# Patient Record
Sex: Female | Born: 1945
Health system: Southern US, Community
[De-identification: ages and names within clinical notes are randomized; demographics above are authoritative.]

## PROBLEM LIST (undated history)

## (undated) DIAGNOSIS — M810 Age-related osteoporosis without current pathological fracture: Secondary | ICD-10-CM

## (undated) DIAGNOSIS — K225 Diverticulum of esophagus, acquired: Secondary | ICD-10-CM

## (undated) DIAGNOSIS — F419 Anxiety disorder, unspecified: Secondary | ICD-10-CM

## (undated) DIAGNOSIS — C801 Malignant (primary) neoplasm, unspecified: Secondary | ICD-10-CM

## (undated) HISTORY — DX: Anxiety disorder, unspecified: F41.9

## (undated) HISTORY — DX: Age-related osteoporosis without current pathological fracture: M81.0

## (undated) HISTORY — DX: Malignant (primary) neoplasm, unspecified: C80.1

## (undated) HISTORY — PX: ZENKER'S DIVERTICULECTOMY: SHX5223

---

## 1999-04-24 ENCOUNTER — Emergency Department (HOSPITAL_COMMUNITY): Admission: EM | Admit: 1999-04-24 | Discharge: 1999-04-25 | Payer: Self-pay | Admitting: *Deleted

## 1999-07-18 ENCOUNTER — Encounter: Admission: RE | Admit: 1999-07-18 | Discharge: 1999-07-18 | Payer: Self-pay | Admitting: Family Medicine

## 1999-07-18 ENCOUNTER — Encounter: Payer: Self-pay | Admitting: Family Medicine

## 1999-07-27 ENCOUNTER — Encounter: Payer: Self-pay | Admitting: Family Medicine

## 1999-07-27 ENCOUNTER — Encounter: Admission: RE | Admit: 1999-07-27 | Discharge: 1999-07-27 | Payer: Self-pay | Admitting: Family Medicine

## 2000-04-30 ENCOUNTER — Encounter: Payer: Self-pay | Admitting: Family Medicine

## 2000-04-30 ENCOUNTER — Encounter: Admission: RE | Admit: 2000-04-30 | Discharge: 2000-04-30 | Payer: Self-pay | Admitting: Family Medicine

## 2000-09-02 ENCOUNTER — Encounter: Admission: RE | Admit: 2000-09-02 | Discharge: 2000-09-02 | Payer: Self-pay | Admitting: Family Medicine

## 2000-09-02 ENCOUNTER — Encounter: Payer: Self-pay | Admitting: Family Medicine

## 2001-05-02 ENCOUNTER — Encounter: Admission: RE | Admit: 2001-05-02 | Discharge: 2001-05-02 | Payer: Self-pay | Admitting: Family Medicine

## 2001-05-02 ENCOUNTER — Encounter: Payer: Self-pay | Admitting: Family Medicine

## 2002-07-07 ENCOUNTER — Encounter: Admission: RE | Admit: 2002-07-07 | Discharge: 2002-07-07 | Payer: Self-pay | Admitting: Family Medicine

## 2002-07-07 ENCOUNTER — Encounter: Payer: Self-pay | Admitting: Family Medicine

## 2002-09-08 ENCOUNTER — Encounter: Payer: Self-pay | Admitting: Family Medicine

## 2002-09-08 ENCOUNTER — Encounter: Admission: RE | Admit: 2002-09-08 | Discharge: 2002-09-08 | Payer: Self-pay | Admitting: Family Medicine

## 2003-02-24 ENCOUNTER — Ambulatory Visit (HOSPITAL_COMMUNITY): Admission: RE | Admit: 2003-02-24 | Discharge: 2003-02-24 | Payer: Self-pay | Admitting: Orthopedic Surgery

## 2003-02-24 ENCOUNTER — Encounter: Payer: Self-pay | Admitting: Orthopedic Surgery

## 2003-07-13 ENCOUNTER — Encounter: Admission: RE | Admit: 2003-07-13 | Discharge: 2003-07-13 | Payer: Self-pay | Admitting: Family Medicine

## 2003-09-08 ENCOUNTER — Other Ambulatory Visit: Admission: RE | Admit: 2003-09-08 | Discharge: 2003-09-08 | Payer: Self-pay | Admitting: *Deleted

## 2004-08-21 ENCOUNTER — Ambulatory Visit (HOSPITAL_COMMUNITY): Admission: RE | Admit: 2004-08-21 | Discharge: 2004-08-21 | Payer: Self-pay | Admitting: Emergency Medicine

## 2004-09-13 ENCOUNTER — Other Ambulatory Visit: Admission: RE | Admit: 2004-09-13 | Discharge: 2004-09-13 | Payer: Self-pay | Admitting: *Deleted

## 2004-09-19 ENCOUNTER — Encounter: Admission: RE | Admit: 2004-09-19 | Discharge: 2004-09-19 | Payer: Self-pay | Admitting: Family Medicine

## 2005-08-22 ENCOUNTER — Ambulatory Visit (HOSPITAL_COMMUNITY): Admission: RE | Admit: 2005-08-22 | Discharge: 2005-08-22 | Payer: Self-pay | Admitting: Family Medicine

## 2005-09-28 ENCOUNTER — Other Ambulatory Visit: Admission: RE | Admit: 2005-09-28 | Discharge: 2005-09-28 | Payer: Self-pay | Admitting: *Deleted

## 2006-08-30 ENCOUNTER — Ambulatory Visit (HOSPITAL_COMMUNITY): Admission: RE | Admit: 2006-08-30 | Discharge: 2006-08-30 | Payer: Self-pay | Admitting: Emergency Medicine

## 2006-09-30 ENCOUNTER — Other Ambulatory Visit: Admission: RE | Admit: 2006-09-30 | Discharge: 2006-09-30 | Payer: Self-pay | Admitting: Family Medicine

## 2007-09-03 ENCOUNTER — Ambulatory Visit (HOSPITAL_COMMUNITY): Admission: RE | Admit: 2007-09-03 | Discharge: 2007-09-03 | Payer: Self-pay | Admitting: Family Medicine

## 2007-10-09 ENCOUNTER — Other Ambulatory Visit: Admission: RE | Admit: 2007-10-09 | Discharge: 2007-10-09 | Payer: Self-pay | Admitting: Family Medicine

## 2007-11-11 ENCOUNTER — Encounter: Admission: RE | Admit: 2007-11-11 | Discharge: 2007-11-11 | Payer: Self-pay | Admitting: Gastroenterology

## 2008-09-17 ENCOUNTER — Ambulatory Visit (HOSPITAL_COMMUNITY): Admission: RE | Admit: 2008-09-17 | Discharge: 2008-09-17 | Payer: Self-pay | Admitting: Family Medicine

## 2008-10-22 ENCOUNTER — Other Ambulatory Visit: Admission: RE | Admit: 2008-10-22 | Discharge: 2008-10-22 | Payer: Self-pay | Admitting: Family Medicine

## 2009-09-19 ENCOUNTER — Ambulatory Visit (HOSPITAL_COMMUNITY): Admission: RE | Admit: 2009-09-19 | Discharge: 2009-09-19 | Payer: Self-pay | Admitting: Family Medicine

## 2010-08-19 ENCOUNTER — Encounter: Payer: Self-pay | Admitting: Family Medicine

## 2010-08-20 ENCOUNTER — Encounter: Payer: Self-pay | Admitting: Family Medicine

## 2010-09-19 ENCOUNTER — Other Ambulatory Visit (HOSPITAL_COMMUNITY): Payer: Self-pay | Admitting: Family Medicine

## 2010-09-19 DIAGNOSIS — Z1231 Encounter for screening mammogram for malignant neoplasm of breast: Secondary | ICD-10-CM

## 2010-09-25 ENCOUNTER — Ambulatory Visit (HOSPITAL_COMMUNITY)
Admission: RE | Admit: 2010-09-25 | Discharge: 2010-09-25 | Disposition: A | Discharge status: Home / Self Care | Payer: Medicare Other | Source: Ambulatory Visit | Attending: Family Medicine | Admitting: Family Medicine

## 2010-09-25 DIAGNOSIS — Z1231 Encounter for screening mammogram for malignant neoplasm of breast: Secondary | ICD-10-CM | POA: Insufficient documentation

## 2011-08-30 ENCOUNTER — Other Ambulatory Visit (HOSPITAL_COMMUNITY): Payer: Self-pay | Admitting: Family Medicine

## 2011-08-30 DIAGNOSIS — Z1231 Encounter for screening mammogram for malignant neoplasm of breast: Secondary | ICD-10-CM

## 2011-09-27 ENCOUNTER — Ambulatory Visit (HOSPITAL_COMMUNITY)
Admission: RE | Admit: 2011-09-27 | Discharge: 2011-09-27 | Disposition: A | Discharge status: Home / Self Care | Payer: Medicare Other | Source: Ambulatory Visit | Attending: Family Medicine | Admitting: Family Medicine

## 2011-09-27 DIAGNOSIS — Z1231 Encounter for screening mammogram for malignant neoplasm of breast: Secondary | ICD-10-CM

## 2011-10-18 ENCOUNTER — Other Ambulatory Visit: Payer: Self-pay | Admitting: Family Medicine

## 2011-10-18 ENCOUNTER — Other Ambulatory Visit (HOSPITAL_COMMUNITY)
Admission: RE | Admit: 2011-10-18 | Discharge: 2011-10-18 | Disposition: A | Discharge status: Home / Self Care | Payer: Medicare Other | Source: Ambulatory Visit | Attending: Family Medicine | Admitting: Family Medicine

## 2011-10-18 DIAGNOSIS — Z124 Encounter for screening for malignant neoplasm of cervix: Secondary | ICD-10-CM | POA: Insufficient documentation

## 2011-10-18 DIAGNOSIS — Z1159 Encounter for screening for other viral diseases: Secondary | ICD-10-CM | POA: Insufficient documentation

## 2012-05-23 ENCOUNTER — Other Ambulatory Visit: Payer: Self-pay

## 2012-11-17 ENCOUNTER — Other Ambulatory Visit (HOSPITAL_COMMUNITY): Payer: Self-pay | Admitting: Family Medicine

## 2012-11-17 DIAGNOSIS — Z1231 Encounter for screening mammogram for malignant neoplasm of breast: Secondary | ICD-10-CM

## 2012-11-19 ENCOUNTER — Ambulatory Visit (HOSPITAL_COMMUNITY)
Admission: RE | Admit: 2012-11-19 | Discharge: 2012-11-19 | Disposition: A | Discharge status: Home / Self Care | Payer: Medicare Other | Source: Ambulatory Visit | Attending: Family Medicine | Admitting: Family Medicine

## 2012-11-19 DIAGNOSIS — Z1231 Encounter for screening mammogram for malignant neoplasm of breast: Secondary | ICD-10-CM | POA: Insufficient documentation

## 2013-11-09 ENCOUNTER — Other Ambulatory Visit (HOSPITAL_COMMUNITY): Payer: Self-pay | Admitting: Family Medicine

## 2013-11-09 DIAGNOSIS — Z1231 Encounter for screening mammogram for malignant neoplasm of breast: Secondary | ICD-10-CM

## 2013-12-01 ENCOUNTER — Ambulatory Visit (HOSPITAL_COMMUNITY)
Admission: RE | Admit: 2013-12-01 | Discharge: 2013-12-01 | Disposition: A | Discharge status: Home / Self Care | Payer: Medicare Other | Source: Ambulatory Visit | Attending: Family Medicine | Admitting: Family Medicine

## 2013-12-01 DIAGNOSIS — Z1231 Encounter for screening mammogram for malignant neoplasm of breast: Secondary | ICD-10-CM | POA: Insufficient documentation

## 2013-12-15 ENCOUNTER — Ambulatory Visit: Payer: Medicare Other | Attending: Family Medicine | Admitting: Physical Therapy

## 2013-12-15 DIAGNOSIS — M545 Low back pain, unspecified: Secondary | ICD-10-CM | POA: Diagnosis not present

## 2013-12-15 DIAGNOSIS — S336XXA Sprain of sacroiliac joint, initial encounter: Secondary | ICD-10-CM | POA: Diagnosis not present

## 2013-12-15 DIAGNOSIS — X58XXXA Exposure to other specified factors, initial encounter: Secondary | ICD-10-CM | POA: Insufficient documentation

## 2013-12-15 DIAGNOSIS — M25559 Pain in unspecified hip: Secondary | ICD-10-CM | POA: Insufficient documentation

## 2013-12-15 DIAGNOSIS — IMO0001 Reserved for inherently not codable concepts without codable children: Secondary | ICD-10-CM | POA: Diagnosis not present

## 2013-12-23 ENCOUNTER — Ambulatory Visit: Payer: Medicare Other | Admitting: Physical Therapy

## 2013-12-23 DIAGNOSIS — IMO0001 Reserved for inherently not codable concepts without codable children: Secondary | ICD-10-CM | POA: Diagnosis not present

## 2013-12-30 ENCOUNTER — Ambulatory Visit: Payer: Medicare Other | Attending: Family Medicine | Admitting: Physical Therapy

## 2013-12-30 DIAGNOSIS — IMO0001 Reserved for inherently not codable concepts without codable children: Secondary | ICD-10-CM | POA: Insufficient documentation

## 2014-01-06 ENCOUNTER — Ambulatory Visit: Payer: Medicare Other | Admitting: Physical Therapy

## 2014-01-06 DIAGNOSIS — IMO0001 Reserved for inherently not codable concepts without codable children: Secondary | ICD-10-CM | POA: Diagnosis not present

## 2014-01-12 ENCOUNTER — Ambulatory Visit: Payer: Medicare Other | Admitting: Physical Therapy

## 2014-01-12 DIAGNOSIS — IMO0001 Reserved for inherently not codable concepts without codable children: Secondary | ICD-10-CM | POA: Diagnosis not present

## 2014-01-19 ENCOUNTER — Ambulatory Visit: Payer: Medicare Other | Admitting: Physical Therapy

## 2014-01-19 DIAGNOSIS — IMO0001 Reserved for inherently not codable concepts without codable children: Secondary | ICD-10-CM | POA: Diagnosis not present

## 2014-01-26 ENCOUNTER — Ambulatory Visit: Payer: Medicare Other | Admitting: Physical Therapy

## 2014-01-26 DIAGNOSIS — IMO0001 Reserved for inherently not codable concepts without codable children: Secondary | ICD-10-CM | POA: Diagnosis not present

## 2014-02-09 ENCOUNTER — Ambulatory Visit: Payer: Medicare Other | Attending: Family Medicine | Admitting: Physical Therapy

## 2014-02-09 DIAGNOSIS — IMO0001 Reserved for inherently not codable concepts without codable children: Secondary | ICD-10-CM | POA: Diagnosis not present

## 2014-07-15 ENCOUNTER — Other Ambulatory Visit: Payer: Self-pay | Admitting: Family Medicine

## 2014-07-15 DIAGNOSIS — R1900 Intra-abdominal and pelvic swelling, mass and lump, unspecified site: Secondary | ICD-10-CM

## 2014-07-19 ENCOUNTER — Ambulatory Visit
Admission: RE | Admit: 2014-07-19 | Discharge: 2014-07-19 | Disposition: A | Discharge status: Home / Self Care | Payer: Medicare Other | Source: Ambulatory Visit | Attending: Family Medicine | Admitting: Family Medicine

## 2014-07-19 DIAGNOSIS — R1900 Intra-abdominal and pelvic swelling, mass and lump, unspecified site: Secondary | ICD-10-CM

## 2014-11-11 ENCOUNTER — Other Ambulatory Visit: Payer: Self-pay | Admitting: Family Medicine

## 2014-11-11 DIAGNOSIS — R1030 Lower abdominal pain, unspecified: Secondary | ICD-10-CM

## 2014-11-17 ENCOUNTER — Ambulatory Visit
Admission: RE | Admit: 2014-11-17 | Discharge: 2014-11-17 | Disposition: A | Discharge status: Home / Self Care | Payer: Medicare Other | Source: Ambulatory Visit | Attending: Family Medicine | Admitting: Family Medicine

## 2014-11-17 DIAGNOSIS — R1030 Lower abdominal pain, unspecified: Secondary | ICD-10-CM

## 2014-11-17 MED ORDER — IOPAMIDOL (ISOVUE-300) INJECTION 61%
100.0000 mL | Freq: Once | INTRAVENOUS | Status: AC | PRN
  Administered 2014-11-17: 100 mL via INTRAVENOUS

## 2014-12-24 ENCOUNTER — Other Ambulatory Visit (HOSPITAL_COMMUNITY): Payer: Self-pay | Admitting: Family Medicine

## 2014-12-24 DIAGNOSIS — Z1231 Encounter for screening mammogram for malignant neoplasm of breast: Secondary | ICD-10-CM

## 2014-12-29 ENCOUNTER — Ambulatory Visit (HOSPITAL_COMMUNITY)
Admission: RE | Admit: 2014-12-29 | Discharge: 2014-12-29 | Disposition: A | Discharge status: Home / Self Care | Payer: Medicare Other | Source: Ambulatory Visit | Attending: Family Medicine | Admitting: Family Medicine

## 2014-12-29 DIAGNOSIS — Z1231 Encounter for screening mammogram for malignant neoplasm of breast: Secondary | ICD-10-CM | POA: Insufficient documentation

## 2015-02-22 ENCOUNTER — Other Ambulatory Visit: Payer: Self-pay | Admitting: Gastroenterology

## 2015-07-12 IMAGING — CT CT ABD-PELV W/ CM
3 of 5 series · 12 of 36 positions shown, 18 images · IV contrast (READICAT/WATER & [ID] ISOVUE 300)
Comparison: None

CLINICAL DATA: Lower abdominal pain and bloating for 1 month

EXAM:
CT ABDOMEN AND PELVIS WITH CONTRAST
TECHNIQUE: Multidetector CT imaging of the abdomen and pelvis was performed
using the standard protocol following bolus administration of
intravenous contrast.
CONTRAST:  100 cc Bsovue-TLL

[Series 3: abd/pelvis with · axial · 0.67mm/px · z∈[-333,-58]mm · 7 of 72 slices shown, 12 images]
[im 9/72  soft-tissue]
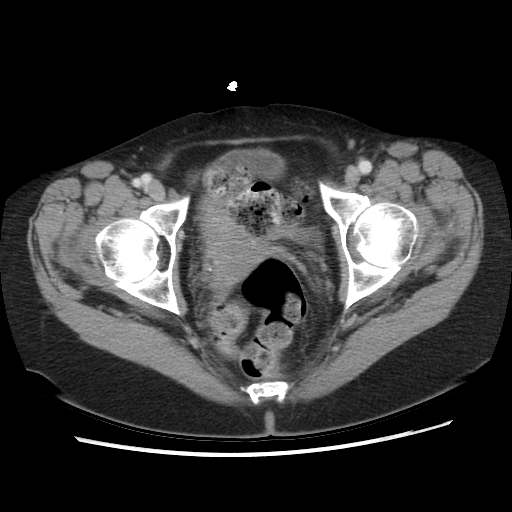
[im 9/72  bone]
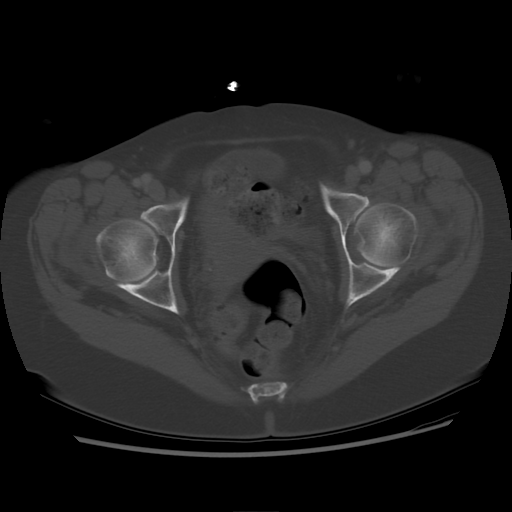
[im 18/72  soft-tissue]
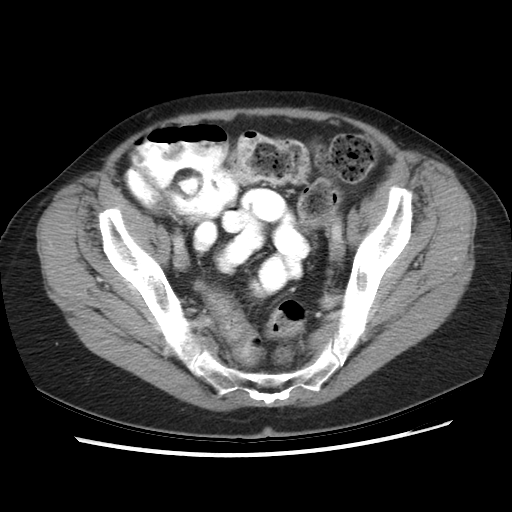
[im 27/72  soft-tissue]
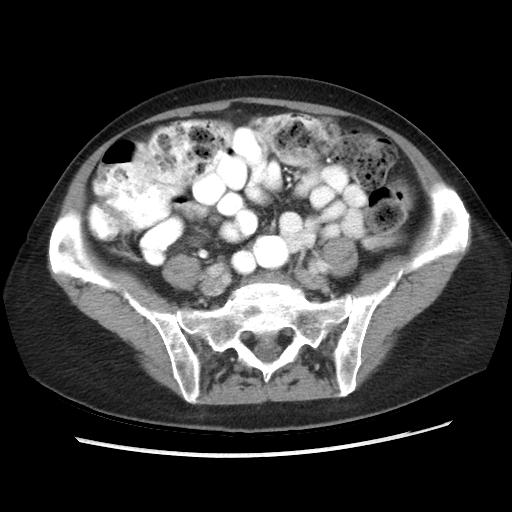
[im 36/72  soft-tissue]
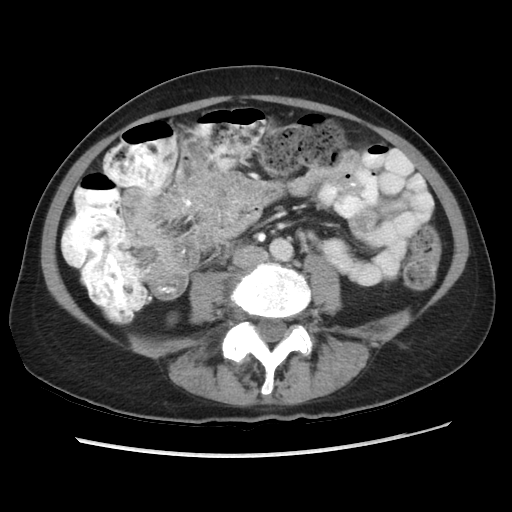
[im 36/72  lung]
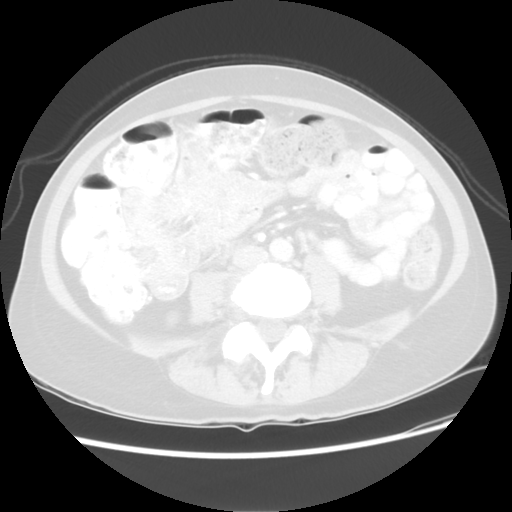
[im 45/72  soft-tissue]
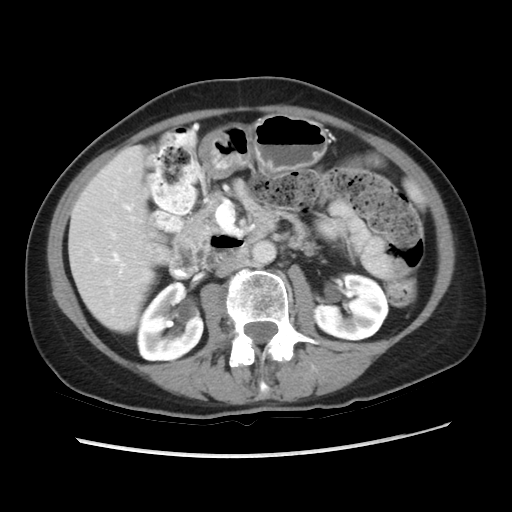
[im 45/72  lung]
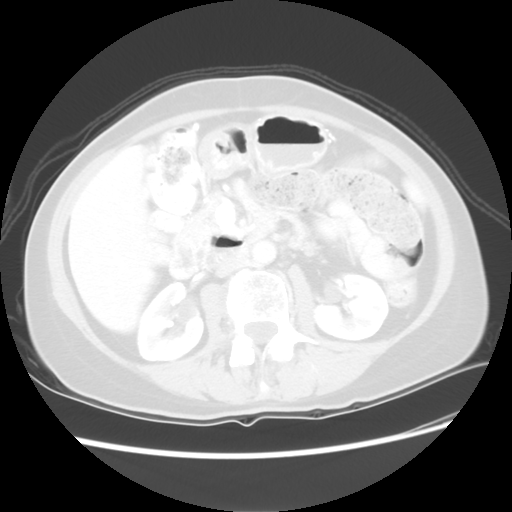
[im 54/72  soft-tissue]
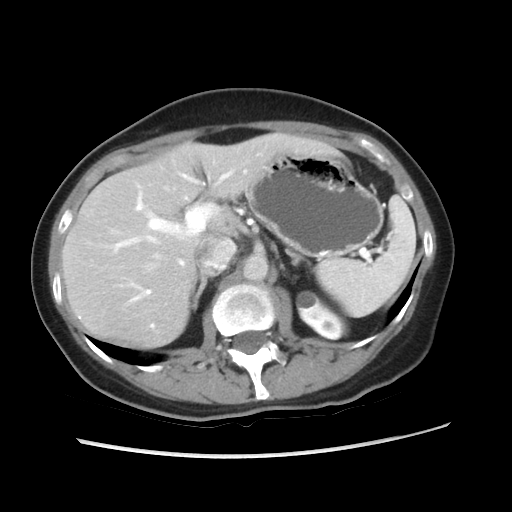
[im 54/72  lung]
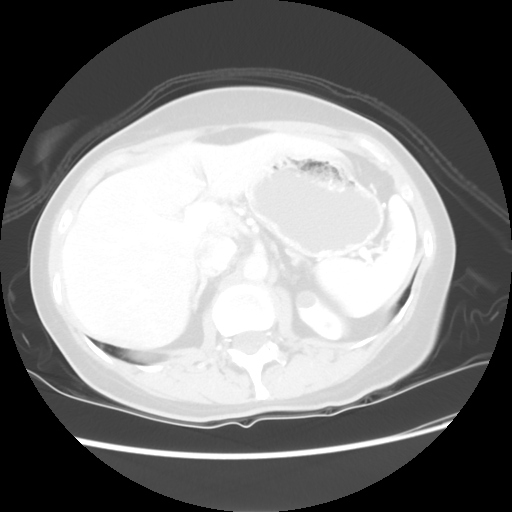
[im 63/72  soft-tissue]
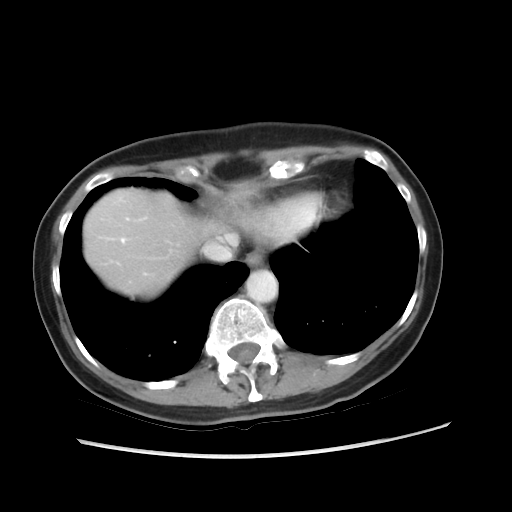
[im 63/72  lung]
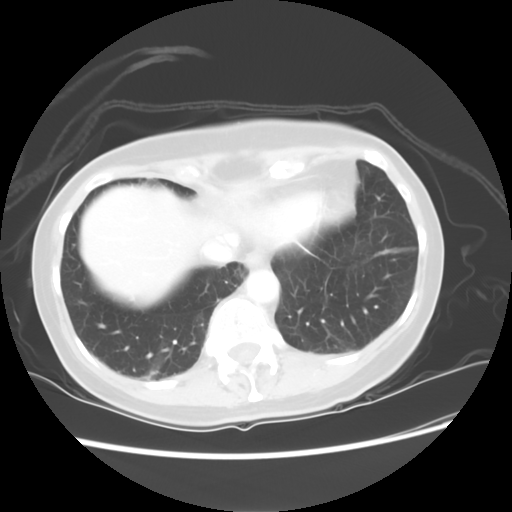

[Series 601: coronal body · coronal · 0.80mm/px · 1 of 106 slices shown, 2 images]
[im 36/106  soft-tissue]
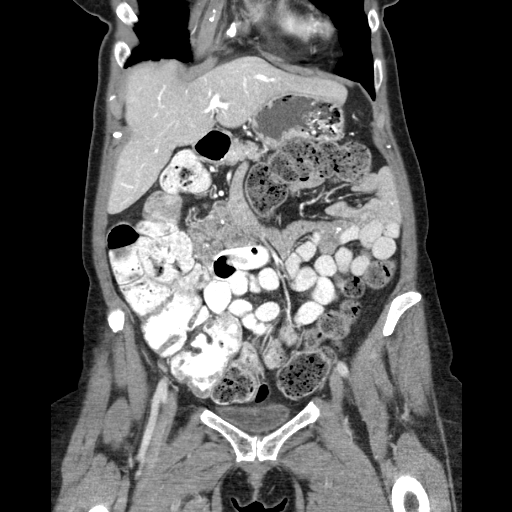
[im 36/106  bone]
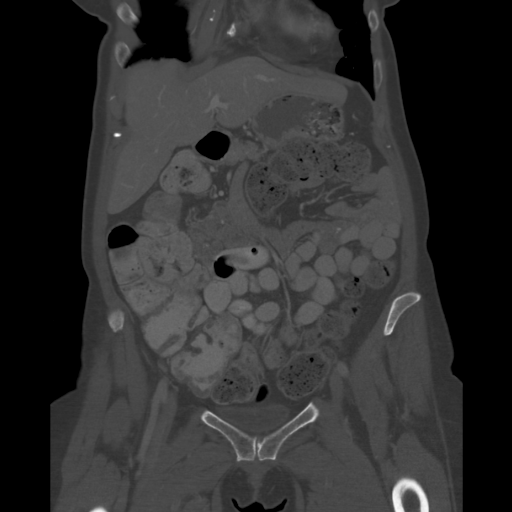

[Series 602: sagittal body · sagittal · 0.80mm/px · 4 of 139 slices shown]
[im 9/139  soft-tissue]
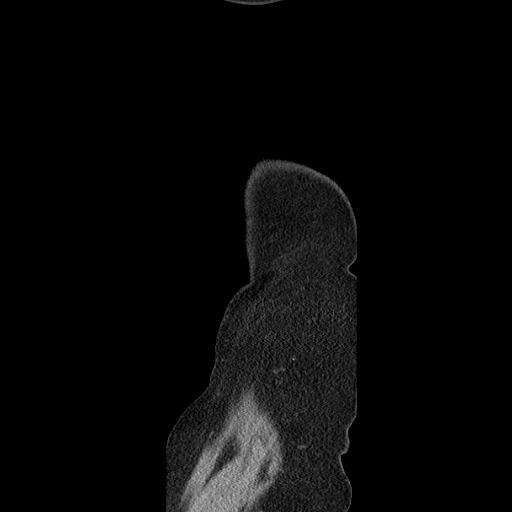
[im 26/139  soft-tissue]
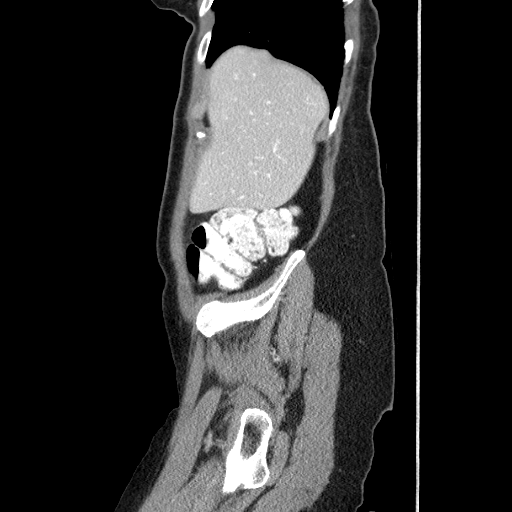
[im 44/139  soft-tissue]
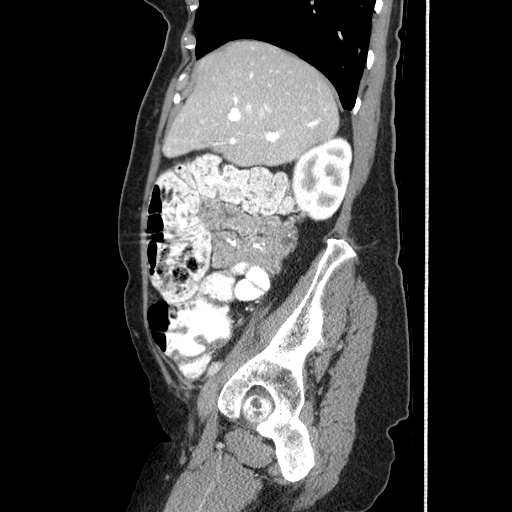
[im 61/139  soft-tissue]
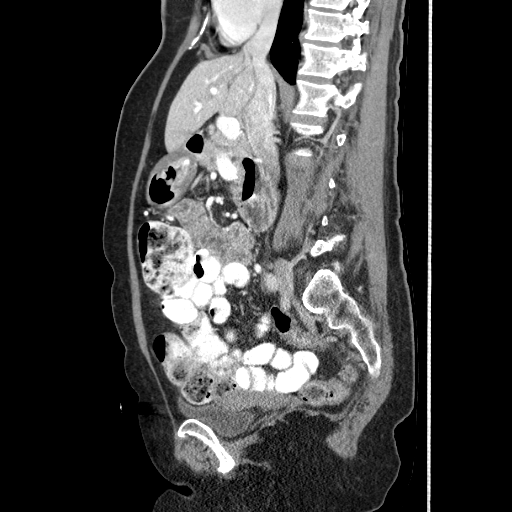

[12 of 36 positions shown; findings below may reference images not displayed]

FINDINGS: The lung bases are unremarkable. Sagittal images of the spine shows
disc space flattening with mild anterior spurring and endplate
sclerotic changes at L4-L5 and L5-S1 level.

Mild hepatic fatty infiltration. The patient is status
postcholecystectomy. No aortic aneurysm. No focal hepatic mass.
Enhanced pancreas, spleen and adrenal glands are unremarkable.
Enhanced kidneys are symmetrical in size. No hydronephrosis or
hydroureter. There is a cyst in upper pole of the left kidney
measures 1.3 cm. Delayed renal images shows bilateral renal
symmetrical excretion. Bilateral visualized proximal ureter is
unremarkable. There is a diverticulum in distal duodenum containing
air measures 2.4 cm.

Moderate stool and contrast noted in right colon. There is a low
lying cecum. No pericecal inflammation. The terminal ileum is
unremarkable. Abundant stool noted in transverse colon. There is
redundant transverse colon. Abundant stool noted in descending colon
and proximal sigmoid colon. There is some stool in distal sigmoid
colon and rectum.

No small bowel obstruction.  No ascites or free air.  No adenopathy.

The uterus is atrophic. No adnexal mass. The appendix is not
identified.
IMPRESSION: 1. Mild hepatic fatty infiltration.
2. Status postcholecystectomy.
3. There is a diverticulum in distal duodenum measures 2.4 cm.
4. Abundant colonic stool as described above. There is redundant
transverse colon with abundant stool. No colonic obstruction. No
small bowel obstruction.
5. No hydronephrosis or hydroureter. Bilateral renal symmetrical
excretion.
6. No adnexal mass.
7. There is a low lying cecum. No pericecal inflammation. The
appendix is not identified.
8. Degenerative changes lumbar spine at L4-L5 and L5-S1 level.

## 2015-08-24 DIAGNOSIS — H40013 Open angle with borderline findings, low risk, bilateral: Secondary | ICD-10-CM | POA: Diagnosis not present

## 2015-08-24 DIAGNOSIS — H25013 Cortical age-related cataract, bilateral: Secondary | ICD-10-CM | POA: Diagnosis not present

## 2015-08-24 DIAGNOSIS — H35031 Hypertensive retinopathy, right eye: Secondary | ICD-10-CM | POA: Diagnosis not present

## 2015-08-24 DIAGNOSIS — H35032 Hypertensive retinopathy, left eye: Secondary | ICD-10-CM | POA: Diagnosis not present

## 2015-08-24 DIAGNOSIS — H43811 Vitreous degeneration, right eye: Secondary | ICD-10-CM | POA: Diagnosis not present

## 2015-08-24 DIAGNOSIS — H2513 Age-related nuclear cataract, bilateral: Secondary | ICD-10-CM | POA: Diagnosis not present

## 2015-09-27 DIAGNOSIS — R69 Illness, unspecified: Secondary | ICD-10-CM | POA: Diagnosis not present

## 2015-11-17 DIAGNOSIS — Z131 Encounter for screening for diabetes mellitus: Secondary | ICD-10-CM | POA: Diagnosis not present

## 2015-11-17 DIAGNOSIS — Z0000 Encounter for general adult medical examination without abnormal findings: Secondary | ICD-10-CM | POA: Diagnosis not present

## 2015-11-17 DIAGNOSIS — R69 Illness, unspecified: Secondary | ICD-10-CM | POA: Diagnosis not present

## 2015-11-17 DIAGNOSIS — K5901 Slow transit constipation: Secondary | ICD-10-CM | POA: Diagnosis not present

## 2015-11-17 DIAGNOSIS — M2550 Pain in unspecified joint: Secondary | ICD-10-CM | POA: Diagnosis not present

## 2015-11-17 DIAGNOSIS — M899 Disorder of bone, unspecified: Secondary | ICD-10-CM | POA: Diagnosis not present

## 2015-11-17 DIAGNOSIS — Z8041 Family history of malignant neoplasm of ovary: Secondary | ICD-10-CM | POA: Diagnosis not present

## 2015-11-17 DIAGNOSIS — Z86010 Personal history of colonic polyps: Secondary | ICD-10-CM | POA: Diagnosis not present

## 2015-11-22 ENCOUNTER — Other Ambulatory Visit: Payer: Self-pay

## 2015-11-22 DIAGNOSIS — Z1231 Encounter for screening mammogram for malignant neoplasm of breast: Secondary | ICD-10-CM

## 2015-12-13 DIAGNOSIS — M859 Disorder of bone density and structure, unspecified: Secondary | ICD-10-CM | POA: Diagnosis not present

## 2015-12-13 DIAGNOSIS — M8589 Other specified disorders of bone density and structure, multiple sites: Secondary | ICD-10-CM | POA: Diagnosis not present

## 2015-12-30 ENCOUNTER — Ambulatory Visit
Admission: RE | Admit: 2015-12-30 | Discharge: 2015-12-30 | Disposition: A | Discharge status: Home / Self Care | Payer: Medicare HMO | Source: Ambulatory Visit

## 2015-12-30 DIAGNOSIS — Z1231 Encounter for screening mammogram for malignant neoplasm of breast: Secondary | ICD-10-CM | POA: Diagnosis not present

## 2016-01-11 DIAGNOSIS — R21 Rash and other nonspecific skin eruption: Secondary | ICD-10-CM | POA: Diagnosis not present

## 2016-01-11 DIAGNOSIS — L918 Other hypertrophic disorders of the skin: Secondary | ICD-10-CM | POA: Diagnosis not present

## 2016-01-11 DIAGNOSIS — D1801 Hemangioma of skin and subcutaneous tissue: Secondary | ICD-10-CM | POA: Diagnosis not present

## 2016-01-11 DIAGNOSIS — B351 Tinea unguium: Secondary | ICD-10-CM | POA: Diagnosis not present

## 2016-01-11 DIAGNOSIS — Z85828 Personal history of other malignant neoplasm of skin: Secondary | ICD-10-CM | POA: Diagnosis not present

## 2016-01-11 DIAGNOSIS — D2371 Other benign neoplasm of skin of right lower limb, including hip: Secondary | ICD-10-CM | POA: Diagnosis not present

## 2016-01-11 DIAGNOSIS — L814 Other melanin hyperpigmentation: Secondary | ICD-10-CM | POA: Diagnosis not present

## 2016-01-11 DIAGNOSIS — D2372 Other benign neoplasm of skin of left lower limb, including hip: Secondary | ICD-10-CM | POA: Diagnosis not present

## 2016-05-20 DIAGNOSIS — R69 Illness, unspecified: Secondary | ICD-10-CM | POA: Diagnosis not present

## 2016-07-17 DIAGNOSIS — R042 Hemoptysis: Secondary | ICD-10-CM | POA: Diagnosis not present

## 2016-07-17 DIAGNOSIS — R5383 Other fatigue: Secondary | ICD-10-CM | POA: Diagnosis not present

## 2016-08-18 DIAGNOSIS — B349 Viral infection, unspecified: Secondary | ICD-10-CM | POA: Diagnosis not present

## 2016-08-18 DIAGNOSIS — R05 Cough: Secondary | ICD-10-CM | POA: Diagnosis not present

## 2016-08-28 DIAGNOSIS — Z111 Encounter for screening for respiratory tuberculosis: Secondary | ICD-10-CM | POA: Diagnosis not present

## 2016-08-28 DIAGNOSIS — J209 Acute bronchitis, unspecified: Secondary | ICD-10-CM | POA: Diagnosis not present

## 2016-08-28 DIAGNOSIS — R042 Hemoptysis: Secondary | ICD-10-CM | POA: Diagnosis not present

## 2016-09-12 ENCOUNTER — Encounter: Payer: Self-pay | Admitting: Internal Medicine

## 2016-09-12 ENCOUNTER — Ambulatory Visit (INDEPENDENT_AMBULATORY_CARE_PROVIDER_SITE_OTHER)
Admission: RE | Admit: 2016-09-12 | Discharge: 2016-09-12 | Disposition: A | Discharge status: Home / Self Care | Payer: Medicare HMO | Source: Ambulatory Visit | Attending: Internal Medicine | Admitting: Internal Medicine

## 2016-09-12 ENCOUNTER — Ambulatory Visit (INDEPENDENT_AMBULATORY_CARE_PROVIDER_SITE_OTHER): Payer: Medicare HMO | Admitting: Internal Medicine

## 2016-09-12 VITALS — BP 114/78 | HR 67 | Ht 64.0 in | Wt 137.0 lb

## 2016-09-12 DIAGNOSIS — R042 Hemoptysis: Secondary | ICD-10-CM | POA: Diagnosis not present

## 2016-09-12 DIAGNOSIS — J449 Chronic obstructive pulmonary disease, unspecified: Secondary | ICD-10-CM | POA: Diagnosis not present

## 2016-09-12 NOTE — Progress Notes (Signed)
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 Pulmonary Consult    Referred by Dr. Cari Patton. Pt states that she has had 3 episodes of hemoptysis in the past 3 wks- last episode was approx 10 days ago. She has also had some bloody nasal d/c. She also c/o minimal chest discomfort.    acutely ill with fever / cough Aug 10 2015  and fever resolved even before the erythromycin assoc with with epistaxis /hemoptysis  Which have now both resolved .  The most blood she had was a tbsp "for 3 days" but turns out this was not 3 days in a row.  Chest discomfort  mostly with coughing/ all anterior, different areas and resolving also    No obvious day to day or daytime variability or assoc sob or  excess/ purulent sputum or mucus plugs or chest tightness, subjective wheeze or overt sinus or hb symptoms. No unusual exp hx or h/o childhood pna/ asthma or knowledge of premature birth.  Sleeping ok without nocturnal  or early am exacerbation  of respiratory  c/o's or need for noct saba. Also denies any obvious fluctuation of symptoms with weather or environmental changes or other aggravating or alleviating factors except as outlined above   Current Medications, Allergies, Complete Past Medical History, Past Surgical History, Family History, and Social History were reviewed in [employer] record.  ROS  The following are not active complaints unless bolded sore throat, dysphagia, dental problems, itching, sneezing,  nasal congestion or excess/ purulent secretions, ear ache,   fever, chills, sweats, unintended wt loss, classically pleuritic or exertional cp,  orthopnea pnd or leg swelling, presyncope, palpitations, abdominal pain, anorexia, nausea, vomiting, diarrhea  or change in bowel or bladder habits, change in stools or urine, dysuria,hematuria,  rash, arthralgias, visual complaints, headache, numbness, weakness or ataxia or problems with walking or coordination,  change in mood/affect or memory.          Review of Systems     Objective:   Physical Exam    .amb wf nad   Wt Readings from Last 3 Encounters:  09/12/16 137 lb (62.1 kg)    Vital signs reviewed  - Note on arrival 02 sats  98% on RA    HEENT: nl dentition, turbinates bilaterally, and oropharynx. Ear canals with hearing aids bilaterally    NECK :  without JVD/Nodes/TM/ nl carotid upstrokes bilaterally   LUNGS: no acc muscle use,  Nl contour chest which is clear to A and P bilaterally without cough on insp or exp maneuvers   CV:  RRR  no s3 or murmur or increase in P2, and no edema   ABD:  soft and nontender with nl inspiratory excursion in the supine position. No bruits or organomegaly appreciated, bowel sounds nl  MS:  Nl gait/ ext warm without deformities, calf tenderness, cyanosis or clubbing No obvious joint restrictions   SKIN: warm and dry without lesions    NEURO:  alert, approp, nl sensorium with  no motor or cerebellar deficits apparent.    CXR PA and Lateral:   09/12/2016 :    I personally reviewed images and agree with radiology impression as follows:    COPD. No pneumonia, CHF, nor other acute cardiopulmonary abnormality.    Assessment:

## 2016-09-12 NOTE — Patient Instructions (Signed)
Please remember to go to the x-ray department downstairs in the basement  for your tests - we will call you with the results when they are available.  Please return if coughing again or problems with nasal symptoms come back up in late summer and don't respond to something over the counter like zyrtec or clariton

## 2016-09-12 NOTE — Progress Notes (Signed)
Spoke with the pt and notified of results and he verbalized understanding and will inform the pt

## 2016-09-13 NOTE — Assessment & Plan Note (Signed)
The total amt of hemoptysis she describes is appropriately proportionate to the epistaxis she was experiencing and thought epistaxis can certainly explain hemoptysis, it doesn't work in reverse, at least not in people living on earth with the effects of gravity.   Therefore if the epistaxis recurs with or without hemoptysis she will need further w/u by ent.  If the hemoptysis only occurs in setting of active epistaxis with nl cxr I would not work this up independently as this would be very low yield in this never smoker  Pulmonary f/u can be prn   Total time devoted to counseling  > 50 % of initial 45 min office visit:  review case with pt/ discussion of options/alternatives/ personally creating written customized instructions  in presence of pt  then going over those specific  Instructions directly with the pt including how to use all of the meds but in particular covering each new medication in detail and the difference between the maintenance= "automatic" meds and the prns using an action plan format for the latter (If this problem/symptom => do that organization reading Left to right).  Please see AVS from this visit for a full list of these instructions which I personally wrote for this pt and  are unique to this visit.

## 2016-09-25 ENCOUNTER — Institutional Professional Consult (permissible substitution): Payer: Medicare HMO | Admitting: Internal Medicine

## 2016-10-03 DIAGNOSIS — S0081XA Abrasion of other part of head, initial encounter: Secondary | ICD-10-CM | POA: Diagnosis not present

## 2016-10-03 DIAGNOSIS — M25531 Pain in right wrist: Secondary | ICD-10-CM | POA: Diagnosis not present

## 2016-10-03 DIAGNOSIS — S0180XA Unspecified open wound of other part of head, initial encounter: Secondary | ICD-10-CM | POA: Diagnosis not present

## 2016-10-09 DIAGNOSIS — S0180XD Unspecified open wound of other part of head, subsequent encounter: Secondary | ICD-10-CM | POA: Diagnosis not present

## 2016-10-09 DIAGNOSIS — S0081XD Abrasion of other part of head, subsequent encounter: Secondary | ICD-10-CM | POA: Diagnosis not present

## 2016-10-09 DIAGNOSIS — M25531 Pain in right wrist: Secondary | ICD-10-CM | POA: Diagnosis not present

## 2016-10-09 DIAGNOSIS — R69 Illness, unspecified: Secondary | ICD-10-CM | POA: Diagnosis not present

## 2016-11-21 DIAGNOSIS — H25013 Cortical age-related cataract, bilateral: Secondary | ICD-10-CM | POA: Diagnosis not present

## 2016-11-21 DIAGNOSIS — H2513 Age-related nuclear cataract, bilateral: Secondary | ICD-10-CM | POA: Diagnosis not present

## 2016-11-21 DIAGNOSIS — H43813 Vitreous degeneration, bilateral: Secondary | ICD-10-CM | POA: Diagnosis not present

## 2016-11-21 DIAGNOSIS — H40013 Open angle with borderline findings, low risk, bilateral: Secondary | ICD-10-CM | POA: Diagnosis not present

## 2016-11-23 ENCOUNTER — Other Ambulatory Visit: Payer: Self-pay | Admitting: Family Medicine

## 2016-11-23 ENCOUNTER — Other Ambulatory Visit (HOSPITAL_COMMUNITY)
Admission: RE | Admit: 2016-11-23 | Discharge: 2016-11-23 | Disposition: A | Discharge status: Home / Self Care | Payer: Medicare HMO | Source: Ambulatory Visit | Attending: Family Medicine | Admitting: Family Medicine

## 2016-11-23 DIAGNOSIS — K5901 Slow transit constipation: Secondary | ICD-10-CM | POA: Diagnosis not present

## 2016-11-23 DIAGNOSIS — Z86010 Personal history of colonic polyps: Secondary | ICD-10-CM | POA: Diagnosis not present

## 2016-11-23 DIAGNOSIS — Z0000 Encounter for general adult medical examination without abnormal findings: Secondary | ICD-10-CM | POA: Diagnosis not present

## 2016-11-23 DIAGNOSIS — M899 Disorder of bone, unspecified: Secondary | ICD-10-CM | POA: Diagnosis not present

## 2016-11-23 DIAGNOSIS — R69 Illness, unspecified: Secondary | ICD-10-CM | POA: Diagnosis not present

## 2016-11-23 DIAGNOSIS — Z124 Encounter for screening for malignant neoplasm of cervix: Secondary | ICD-10-CM | POA: Diagnosis not present

## 2016-11-23 DIAGNOSIS — Z1231 Encounter for screening mammogram for malignant neoplasm of breast: Secondary | ICD-10-CM

## 2016-11-23 DIAGNOSIS — C4491 Basal cell carcinoma of skin, unspecified: Secondary | ICD-10-CM | POA: Diagnosis not present

## 2016-11-23 DIAGNOSIS — Z131 Encounter for screening for diabetes mellitus: Secondary | ICD-10-CM | POA: Diagnosis not present

## 2016-11-27 LAB — CYTOLOGY - PAP: Diagnosis: NEGATIVE

## 2016-11-29 ENCOUNTER — Other Ambulatory Visit: Payer: Self-pay | Admitting: Family Medicine

## 2016-11-29 DIAGNOSIS — Z1231 Encounter for screening mammogram for malignant neoplasm of breast: Secondary | ICD-10-CM

## 2016-12-31 ENCOUNTER — Ambulatory Visit: Payer: Medicare HMO

## 2017-01-22 ENCOUNTER — Ambulatory Visit
Admission: RE | Admit: 2017-01-22 | Discharge: 2017-01-22 | Disposition: A | Discharge status: Home / Self Care | Payer: Medicare HMO | Source: Ambulatory Visit | Attending: Family Medicine | Admitting: Family Medicine

## 2017-01-22 DIAGNOSIS — Z1231 Encounter for screening mammogram for malignant neoplasm of breast: Secondary | ICD-10-CM

## 2017-01-24 DIAGNOSIS — L738 Other specified follicular disorders: Secondary | ICD-10-CM | POA: Diagnosis not present

## 2017-01-24 DIAGNOSIS — B351 Tinea unguium: Secondary | ICD-10-CM | POA: Diagnosis not present

## 2017-01-24 DIAGNOSIS — L853 Xerosis cutis: Secondary | ICD-10-CM | POA: Diagnosis not present

## 2017-01-24 DIAGNOSIS — B353 Tinea pedis: Secondary | ICD-10-CM | POA: Diagnosis not present

## 2017-01-24 DIAGNOSIS — L821 Other seborrheic keratosis: Secondary | ICD-10-CM | POA: Diagnosis not present

## 2017-01-24 DIAGNOSIS — D1801 Hemangioma of skin and subcutaneous tissue: Secondary | ICD-10-CM | POA: Diagnosis not present

## 2017-01-24 DIAGNOSIS — D2262 Melanocytic nevi of left upper limb, including shoulder: Secondary | ICD-10-CM | POA: Diagnosis not present

## 2017-01-24 DIAGNOSIS — Z85828 Personal history of other malignant neoplasm of skin: Secondary | ICD-10-CM | POA: Diagnosis not present

## 2017-01-24 DIAGNOSIS — D225 Melanocytic nevi of trunk: Secondary | ICD-10-CM | POA: Diagnosis not present

## 2017-01-24 DIAGNOSIS — I788 Other diseases of capillaries: Secondary | ICD-10-CM | POA: Diagnosis not present

## 2017-05-15 DIAGNOSIS — R69 Illness, unspecified: Secondary | ICD-10-CM | POA: Diagnosis not present

## 2017-05-23 DIAGNOSIS — H04123 Dry eye syndrome of bilateral lacrimal glands: Secondary | ICD-10-CM | POA: Diagnosis not present

## 2017-05-23 DIAGNOSIS — H40013 Open angle with borderline findings, low risk, bilateral: Secondary | ICD-10-CM | POA: Diagnosis not present

## 2017-08-22 DIAGNOSIS — R042 Hemoptysis: Secondary | ICD-10-CM | POA: Diagnosis not present

## 2017-08-22 DIAGNOSIS — J069 Acute upper respiratory infection, unspecified: Secondary | ICD-10-CM | POA: Diagnosis not present

## 2017-10-26 DIAGNOSIS — N390 Urinary tract infection, site not specified: Secondary | ICD-10-CM | POA: Diagnosis not present

## 2017-10-26 DIAGNOSIS — R300 Dysuria: Secondary | ICD-10-CM | POA: Diagnosis not present

## 2017-11-21 DIAGNOSIS — H2513 Age-related nuclear cataract, bilateral: Secondary | ICD-10-CM | POA: Diagnosis not present

## 2017-11-21 DIAGNOSIS — H25013 Cortical age-related cataract, bilateral: Secondary | ICD-10-CM | POA: Diagnosis not present

## 2017-11-21 DIAGNOSIS — H43813 Vitreous degeneration, bilateral: Secondary | ICD-10-CM | POA: Diagnosis not present

## 2017-11-21 DIAGNOSIS — H40013 Open angle with borderline findings, low risk, bilateral: Secondary | ICD-10-CM | POA: Diagnosis not present

## 2017-11-27 DIAGNOSIS — R69 Illness, unspecified: Secondary | ICD-10-CM | POA: Diagnosis not present

## 2017-11-27 DIAGNOSIS — Z86010 Personal history of colonic polyps: Secondary | ICD-10-CM | POA: Diagnosis not present

## 2017-11-27 DIAGNOSIS — M2550 Pain in unspecified joint: Secondary | ICD-10-CM | POA: Diagnosis not present

## 2017-11-27 DIAGNOSIS — Z0000 Encounter for general adult medical examination without abnormal findings: Secondary | ICD-10-CM | POA: Diagnosis not present

## 2017-11-27 DIAGNOSIS — K5901 Slow transit constipation: Secondary | ICD-10-CM | POA: Diagnosis not present

## 2017-11-27 DIAGNOSIS — C4491 Basal cell carcinoma of skin, unspecified: Secondary | ICD-10-CM | POA: Diagnosis not present

## 2017-11-27 DIAGNOSIS — Z124 Encounter for screening for malignant neoplasm of cervix: Secondary | ICD-10-CM | POA: Diagnosis not present

## 2017-11-27 DIAGNOSIS — M85851 Other specified disorders of bone density and structure, right thigh: Secondary | ICD-10-CM | POA: Diagnosis not present

## 2017-11-27 DIAGNOSIS — Z131 Encounter for screening for diabetes mellitus: Secondary | ICD-10-CM | POA: Diagnosis not present

## 2017-12-10 DIAGNOSIS — R69 Illness, unspecified: Secondary | ICD-10-CM | POA: Diagnosis not present

## 2017-12-11 DIAGNOSIS — M8588 Other specified disorders of bone density and structure, other site: Secondary | ICD-10-CM | POA: Diagnosis not present

## 2018-01-16 ENCOUNTER — Ambulatory Visit: Payer: Medicare HMO

## 2018-01-16 ENCOUNTER — Other Ambulatory Visit: Payer: Self-pay | Admitting: Family Medicine

## 2018-01-16 DIAGNOSIS — Z1231 Encounter for screening mammogram for malignant neoplasm of breast: Secondary | ICD-10-CM

## 2018-02-04 ENCOUNTER — Ambulatory Visit
Admission: RE | Admit: 2018-02-04 | Discharge: 2018-02-04 | Disposition: A | Discharge status: Home / Self Care | Payer: Medicare HMO | Source: Ambulatory Visit | Attending: Family Medicine | Admitting: Family Medicine

## 2018-02-04 DIAGNOSIS — Z1231 Encounter for screening mammogram for malignant neoplasm of breast: Secondary | ICD-10-CM

## 2018-02-05 ENCOUNTER — Ambulatory Visit: Payer: Medicare HMO

## 2018-02-20 DIAGNOSIS — R69 Illness, unspecified: Secondary | ICD-10-CM | POA: Diagnosis not present

## 2018-04-10 DIAGNOSIS — G8929 Other chronic pain: Secondary | ICD-10-CM | POA: Diagnosis not present

## 2018-04-10 DIAGNOSIS — M25562 Pain in left knee: Secondary | ICD-10-CM | POA: Diagnosis not present

## 2018-04-16 ENCOUNTER — Other Ambulatory Visit: Payer: Self-pay

## 2018-04-16 ENCOUNTER — Ambulatory Visit: Payer: Medicare HMO | Attending: Family Medicine

## 2018-04-16 DIAGNOSIS — M25561 Pain in right knee: Secondary | ICD-10-CM | POA: Diagnosis not present

## 2018-04-16 DIAGNOSIS — G8929 Other chronic pain: Secondary | ICD-10-CM

## 2018-04-16 DIAGNOSIS — M6281 Muscle weakness (generalized): Secondary | ICD-10-CM | POA: Diagnosis not present

## 2018-04-16 DIAGNOSIS — R2689 Other abnormalities of gait and mobility: Secondary | ICD-10-CM

## 2018-04-16 DIAGNOSIS — M25562 Pain in left knee: Secondary | ICD-10-CM | POA: Diagnosis not present

## 2018-04-16 NOTE — Patient Instructions (Signed)
Access Code: [email or web address]  URL: [email or web address]  Date: 04/16/2018  Prepared by: Sigurd Sos   Exercises  Supine Active Straight Leg Raise - 10 reps - 2 sets - 2x daily - 7x weekly  Seated Long Arc Quad - 10 reps - 2 sets - 5 hold - 3x daily - 7x weekly  Seated Hamstring Stretch - 3 reps - 1 sets - 20 hold - 3x daily - 7x weekly

## 2018-04-16 NOTE — Therapy (Signed)
[facility] [address], [place], [place], [address] Phone: [phone]   Fax:  [phone]  Physical Therapy Evaluation  Patient Details  Name: Christina Patton MRN: [id number] Date of Birth: [birth year] Referring Provider: Cari Caraway, MD   Encounter Date: 04/16/2018  PT End of Session - 04/16/18 1144    Visit Number  1    Date for PT Re-Evaluation  06/11/18    Authorization Type  aetna Medicare    PT Start Time  1103    PT Stop Time  1146    PT Time Calculation (min)  43 min    Activity Tolerance  Patient tolerated treatment well    Behavior During Therapy  [facility] for tasks assessed/performed       Past Medical History:  Diagnosis Date  . Anxiety   . Cancer ([place])    skin cancer on face  . Osteoporosis     History reviewed. No pertinent surgical history.  There were no vitals filed for this visit.   Subjective Assessment - 04/16/18 1108    Subjective  Pt presents to PT with complaints of bil knee pain that is chronic with flare-up of Lt knee pain that began ~2 weeks ago with edema.  No known cause.      Pertinent History  none    How long can you walk comfortably?  walk with dog: 0.5 to 1 mile with dog.      Diagnostic tests  none    Patient Stated Goals  reduce Lt knee pain, reduce knee swelling    Currently in Pain?  Yes    Pain Score  4     Pain Location  Knee    Pain Orientation  Left    Pain Descriptors / Indicators  Discomfort;Aching    Pain Onset  1 to 4 weeks ago    Pain Frequency  Constant    Aggravating Factors   getting down on knees for gardening, squatting, steps     Pain Relieving Factors  lidocane rub, ice, rest    Effect of Pain on Daily Activities  not gardening         [facility] PT Assessment - 04/16/18 0001      Assessment   Medical Diagnosis  pain in Lt knee, chronic knee pain with Lt knee acute exacerbation    Referring Provider  Cari Caraway, MD    Onset Date/Surgical Date   04/02/18    Next MD Visit  none    Prior Therapy  none      Precautions   Precautions  None      Restrictions   Weight Bearing Restrictions  No      Balance Screen   Has the patient fallen in the past 6 months  No    Has the patient had a decrease in activity level because of a fear of falling?   No    Is the patient reluctant to leave their home because of a fear of falling?   No      Home Environment   Living Environment  Private residence    Living Arrangements  Spouse/significant other    Home Access  Stairs to enter    Home Layout  Two level      Prior Function   Level of Independence  Independent    Vocation  Part time employment    Vocation Requirements  pt owns a business: Paediatric nurse    Leisure  gardending, walking      Cognition   Overall Cognitive Status  Within Functional Limits for tasks assessed      Observation/Other Assessments   Focus on Therapeutic Outcomes (FOTO)   34% limitation      Posture/Postural Control   Posture/Postural Control  Postural limitations    Postural Limitations  Flexed trunk   bil knee flexion.       ROM / Strength   AROM / PROM / Strength  AROM;PROM;Strength      AROM   Overall AROM   Within functional limits for tasks performed    Overall AROM Comments  Rt=Lt knee and hip A/ROM WFLs.  Reduced hamstring length by 25%       PROM   Overall PROM   Within functional limits for tasks performed      Strength   Overall Strength  Deficits    Strength Assessment Site  Knee;Hip    Right/Left Hip  Right;Left    Right Hip Flexion  4/5    Right Hip Extension  4+/5    Right Hip ABduction  4+/5    Left Hip Flexion  4-/5    Left Hip Extension  4/5    Left Hip ABduction  4/5    Right/Left Knee  Right;Left    Right Knee Flexion  4+/5    Right Knee Extension  4+/5    Left Knee Flexion  4+/5    Left Knee Extension  4+/5      Palpation   Patella mobility  good patellar mobility in all directions without pain     Palpation comment  Pt with mild edema distal to Lt patella and tenderness over patellar tendon and medial quad      Transfers   Transfers  Sit to Stand;Stand to Sit    Sit to Stand  7: Independent    Five time sit to stand comments   12.3 seconds without hands    Stand to Sit  7: Independent      Ambulation/Gait   Ambulation/Gait  Yes    Ambulation/Gait Assistance  7: Independent    Gait Pattern  Step-through pattern;Decreased stride length;Right flexed knee in stance;Left flexed knee in stance                Objective measurements completed on examination: See above findings.              PT Education - 04/16/18 1139    Education Details   Access Code: [email or web address]     Person(s) Educated  Patient    Methods  Explanation;Demonstration;Handout    Comprehension  Verbalized understanding;Returned demonstration       PT Short Term Goals - 04/16/18 1235      PT SHORT TERM GOAL #1   Title  be independent in initial HEP    Time  4    Period  Weeks    Status  New    Target Date  05/14/18      PT SHORT TERM GOAL #2   Title  squat with ADLs and report 25% reduction in Lt knee pain    Time  4    Period  Weeks    Status  New    Target Date  05/14/18      PT SHORT TERM GOAL #3   Title  negotiate steps with step-over-step with < or = to 2/10 Lt knee pain    Time  4    Period  Weeks  Status  New    Target Date  06/11/18        PT Long Term Goals - 04/16/18 1236      PT LONG TERM GOAL #1   Title  be independent in advanced HEP    Time  8    Period  Weeks    Status  New    Target Date  06/11/18      PT LONG TERM GOAL #2   Title  reduce FOTO to < or = to 29% limitation    Time  8    Period  Weeks    Status  New    Target Date  06/11/18      PT LONG TERM GOAL #3   Title  squat with ADLs with 70% less Lt knee pain    Time  8    Period  Weeks    Status  New    Target Date  06/11/18      PT LONG TERM GOAL #4   Title  return to gardening on knees  without limitation    Time  8    Period  Weeks    Status  New    Target Date  06/11/18      PT LONG TERM GOAL #5   Title  demonstrate normalized step length with knee extension at heel strike and stance phase of gait    Time  8    Period  Weeks    Status  New    Target Date  06/11/18             Plan - 04/16/18 1139    Clinical Impression Statement  Pt presents to PT with chronic bilateral knee pain with flare-up of Lt knee pain with edema ~2 weeks ago.  Pt denies any incident or injury.  Pt is active with [employer] and would like to return to her class today.  Pt reports up to 4/10 Lt knee pain with negotiating steps, squatting and kneeling with gardening.  Pt demonstrates bil knee flexion during heel strike phase of gait and demonstrates shortened step length bilaterally.  Pt with reduced hamstring length bilaterally and weakness in hip abduction and flexion with quad lag on Lt with SLR.  Pt with mild edema distal to Lt patella and tenderness to palpation at patellar tendon and lateral quad.  Pt will benefit from skilled PT for hip and knee strength and stability, edema/pain management as needed, gait and balance training and flexibility.      History and Personal Factors relevant to plan of care:  none    Clinical Presentation  Stable    Clinical Presentation due to:  chronic knee pain with recent/moderate flare-up.      Clinical Decision Making  Low    Rehab Potential  Excellent    PT Frequency  1x / week    PT Duration  8 weeks    PT Treatment/Interventions  ADLs/Self Care Home Management;Cryotherapy;Electrical Stimulation;Ultrasound;Iontophoresis 4mg /ml Dexamethasone;Stair training;Functional mobility training;Therapeutic activities;Therapeutic exercise;Patient/family education;Neuromuscular re-education;Manual techniques;Passive range of motion;Taping;Dry needling    PT Next Visit Plan  review HEP, gastroc flexibility, gait training for improved heel strike and step  length, balance/proprioception    PT Home Exercise Plan   Access Code: [email or web address]     Consulted and Agree with Plan of Care  Patient       Patient will benefit from skilled therapeutic intervention in order to improve the following deficits and impairments:  Abnormal gait, Pain,  Postural dysfunction, Increased edema, Decreased range of motion, Decreased activity tolerance  Visit Diagnosis: Chronic pain of left knee - Plan: PT plan of care cert/re-cert  Chronic pain of right knee - Plan: PT plan of care cert/re-cert  Other abnormalities of gait and mobility - Plan: PT plan of care cert/re-cert  Muscle weakness (generalized) - Plan: PT plan of care cert/re-cert     Problem List Patient Active Problem List   Diagnosis Date Noted  . Hemoptysis 09/12/2016     Sigurd Sos, PT 04/16/18 12:43 PM  [facility] [address] [place], [place], [address] Phone: [phone]   Fax:  [phone]  Name: Christina Patton MRN: [id number] Date of Birth: 02-19-1946

## 2018-04-22 ENCOUNTER — Ambulatory Visit: Payer: Medicare HMO

## 2018-04-22 DIAGNOSIS — M6281 Muscle weakness (generalized): Secondary | ICD-10-CM | POA: Diagnosis not present

## 2018-04-22 DIAGNOSIS — G8929 Other chronic pain: Secondary | ICD-10-CM | POA: Diagnosis not present

## 2018-04-22 DIAGNOSIS — M25562 Pain in left knee: Principal | ICD-10-CM

## 2018-04-22 DIAGNOSIS — R2689 Other abnormalities of gait and mobility: Secondary | ICD-10-CM | POA: Diagnosis not present

## 2018-04-22 DIAGNOSIS — M25561 Pain in right knee: Secondary | ICD-10-CM

## 2018-04-22 NOTE — Patient Instructions (Signed)
Access Code: [email or web address]  URL: [email or web address]  Date: 04/22/2018  Prepared by: Sigurd Sos   Exercises  Supine Bridge with [employer] Between Knees - 10 reps - 2 sets - 5 hold - 2x daily - 7x weekly  Sit to Stand without Arm Support - 10 reps - 2 sets - 2x daily - 7x weekly  Clamshell - 10 reps - 2 sets - 2x daily - 7x weekly

## 2018-04-22 NOTE — Therapy (Signed)
[facility] [address], [place], [place], [address] Phone: [phone]   Fax:  [phone]  Physical Therapy Treatment  Patient Details  Name: Christina Patton MRN: [id number] Date of Birth: 1946-01-27 Referring Provider: Cari Caraway, MD   Encounter Date: 04/22/2018  PT End of Session - 04/22/18 1114    Visit Number  2    Date for PT Re-Evaluation  06/11/18    Authorization Type  aetna Medicare    PT Start Time  1017    PT Stop Time  1107    PT Time Calculation (min)  50 min    Activity Tolerance  Patient tolerated treatment well    Behavior During Therapy  [facility] for tasks assessed/performed       Past Medical History:  Diagnosis Date  . Anxiety   . Cancer ([place])    skin cancer on face  . Osteoporosis     History reviewed. No pertinent surgical history.  There were no vitals filed for this visit.  Subjective Assessment - 04/22/18 1020    Subjective  Lt knee pain this morning and I used heat and topical rub.      Patient Stated Goals  reduce Lt knee pain, reduce knee swelling    Currently in Pain?  Yes    Pain Score  2    up to 6-7/10 this morning   Pain Location  Knee    Pain Orientation  Left    Pain Descriptors / Indicators  Discomfort;Aching    Pain Type  Chronic pain    Pain Onset  1 to 4 weeks ago    Pain Frequency  Constant    Aggravating Factors   getting down on knees, early morning, squatting, steps    Pain Relieving Factors  topical rub, ice, rest                       [facility] Adult PT Treatment/Exercise - 04/22/18 0001      Exercises   Exercises  Knee/Hip      Knee/Hip Exercises: Stretches   Active Hamstring Stretch  Both;2 reps;20 seconds      Knee/Hip Exercises: Aerobic   Nustep  Level 2x 8 minutes    PT present to discuss progress     Knee/Hip Exercises: Standing   Rebounder  weight shifting 3 ways x 1 minute each      Knee/Hip Exercises: Seated   Long Arc  Quad  Strengthening;Both;2 sets;10 reps    Sit to [employer]  2 sets;10 reps;without UE support      Knee/Hip Exercises: Supine   Bridges  Strengthening;Both;2 sets;10 reps    Bridges Limitations  ball squeeze      Knee/Hip Exercises: Sidelying   Clams  x10 each             PT Education - 04/22/18 1104    Education Details   Access Code: [email or web address]     Person(s) Educated  Patient    Methods  Explanation;Demonstration;Handout    Comprehension  Verbalized understanding;Returned demonstration       PT Short Term Goals - 04/16/18 1235      PT SHORT TERM GOAL #1   Title  be independent in initial HEP    Time  4    Period  Weeks    Status  New    Target Date  05/14/18      PT SHORT TERM GOAL #2   Title  squat with ADLs and report 25% reduction in Lt knee pain    Time  4    Period  Weeks    Status  New    Target Date  05/14/18      PT SHORT TERM GOAL #3   Title  negotiate steps with step-over-step with < or = to 2/10 Lt knee pain    Time  4    Period  Weeks    Status  New    Target Date  06/11/18        PT Long Term Goals - 04/16/18 1236      PT LONG TERM GOAL #1   Title  be independent in advanced HEP    Time  8    Period  Weeks    Status  New    Target Date  06/11/18      PT LONG TERM GOAL #2   Title  reduce FOTO to < or = to 29% limitation    Time  8    Period  Weeks    Status  New    Target Date  06/11/18      PT LONG TERM GOAL #3   Title  squat with ADLs with 70% less Lt knee pain    Time  8    Period  Weeks    Status  New    Target Date  06/11/18      PT LONG TERM GOAL #4   Title  return to gardening on knees without limitation    Time  8    Period  Weeks    Status  New    Target Date  06/11/18      PT LONG TERM GOAL #5   Title  demonstrate normalized step length with knee extension at heel strike and stance phase of gait    Time  8    Period  Weeks    Status  New    Target Date  06/11/18            Plan - 04/22/18 1042     Clinical Impression Statement  Pt with first time follow up after evaluation.  Pt had increase in Lt knee pain this morning upon waking.  Pt reports that she has returned to [employer] without limitation or increased knee pain.    Pt is able to perform all HEP without difficulty.  Pt required tactile cues for quad activation with SLR and long arc quads today.  Pt demonstrated Lt quad and hip instability with standing proprioception exercises today. Pt will continue to benefit from skilled PT for hip and knee strength, flexibility and stability to allow for return to regular function without limitation.      Rehab Potential  Excellent    PT Frequency  1x / week    PT Duration  8 weeks    PT Treatment/Interventions  ADLs/Self Care Home Management;Cryotherapy;Electrical Stimulation;Ultrasound;Iontophoresis 4mg /ml Dexamethasone;Stair training;Functional mobility training;Therapeutic activities;Therapeutic exercise;Patient/family education;Neuromuscular re-education;Manual techniques;Passive range of motion;Taping;Dry needling    PT Next Visit Plan  review HEP, gastroc flexibility, gait training for improved heel strike and step length, balance/proprioception    PT Home Exercise Plan   Access Code: [email or web address]     Consulted and Agree with Plan of Care  Patient       Patient will benefit from skilled therapeutic intervention in order to improve the following deficits and impairments:  Abnormal gait, Pain, Postural dysfunction, Increased edema, Decreased range of motion, Decreased activity  tolerance  Visit Diagnosis: Chronic pain of left knee  Chronic pain of right knee  Other abnormalities of gait and mobility  Muscle weakness (generalized)     Problem List Patient Active Problem List   Diagnosis Date Noted  . Hemoptysis 09/12/2016   Sigurd Sos, PT 04/22/18 11:17 AM  [facility] [address] [place], [place],  [address] Phone: [phone]   Fax:  [phone]  Name: Christina Patton MRN: [id number] Date of Birth: June 18, 1946

## 2018-04-23 ENCOUNTER — Ambulatory Visit: Payer: Medicare HMO | Admitting: Physical Therapy

## 2018-04-23 ENCOUNTER — Encounter

## 2018-04-27 DIAGNOSIS — R69 Illness, unspecified: Secondary | ICD-10-CM | POA: Diagnosis not present

## 2018-04-30 ENCOUNTER — Ambulatory Visit: Payer: Medicare HMO | Attending: Family Medicine

## 2018-04-30 DIAGNOSIS — M25562 Pain in left knee: Secondary | ICD-10-CM | POA: Diagnosis not present

## 2018-04-30 DIAGNOSIS — M25561 Pain in right knee: Secondary | ICD-10-CM | POA: Insufficient documentation

## 2018-04-30 DIAGNOSIS — G8929 Other chronic pain: Secondary | ICD-10-CM | POA: Insufficient documentation

## 2018-04-30 DIAGNOSIS — R2689 Other abnormalities of gait and mobility: Secondary | ICD-10-CM | POA: Diagnosis not present

## 2018-04-30 DIAGNOSIS — M6281 Muscle weakness (generalized): Secondary | ICD-10-CM | POA: Diagnosis not present

## 2018-04-30 NOTE — Patient Instructions (Signed)
Access Code: [email or web address]  URL: [email or web address]  Date: 04/30/2018  Prepared by: Sigurd Sos   Exercises  Modified Marcello Moores Stretch - 3 reps - 1 sets - 20 hold - 7x weekly  Standing Hip Abduction with Counter Support - 10 reps - 2 sets - 2x daily - 7x weekly  Standing Hip Extension with Counter Support - 10 reps - 2 sets - 2x daily - 7x weekly

## 2018-04-30 NOTE — Therapy (Addendum)
[facility] [address] [place], [place], [address] Phone: [phone]   Fax:  [phone]  Physical Therapy Treatment  Patient Details  Name: Christina Patton MRN: [id number] Date of Birth: 02-14-1946 Referring Provider (PT): Cari Caraway, MD   Encounter Date: 04/30/2018  PT End of Session - 04/30/18 1517    Visit Number  3    Date for PT Re-Evaluation  06/11/18    Authorization Type  aetna Medicare    PT Start Time  1446    PT Stop Time  1526    PT Time Calculation (min)  40 min    Activity Tolerance  Patient tolerated treatment well    Behavior During Therapy  [facility] for tasks assessed/performed       Past Medical History:  Diagnosis Date  . Anxiety   . Cancer ([place])    skin cancer on face  . Osteoporosis     History reviewed. No pertinent surgical history.  There were no vitals filed for this visit.  Subjective Assessment - 04/30/18 1449    Subjective  I feel better than I have felt in a while.      Patient Stated Goals  reduce Lt knee pain, reduce knee swelling    Currently in Pain?  Yes    Pain Location  Knee    Pain Orientation  Left    Pain Descriptors / Indicators  Discomfort    Pain Type  Chronic pain    Pain Onset  1 to 4 weeks ago    Pain Frequency  Constant    Aggravating Factors   early morning, squatting    Pain Relieving Factors  topical rub, rest                       [facility] Adult PT Treatment/Exercise - 04/30/18 0001      Exercises   Exercises  Knee/Hip      Knee/Hip Exercises: Stretches   Active Hamstring Stretch  Both;2 reps;20 seconds    Hip Flexor Stretch  Both;3 reps;20 seconds      Knee/Hip Exercises: Aerobic   Nustep  Level 2x 8 minutes    PT present to discuss progress     Knee/Hip Exercises: Standing   Hip Abduction  Stengthening;Both;2 sets;10 reps    Hip Extension  Stengthening;Both;2 sets;10 reps    Rebounder  weight shifting 3 ways x 1 minute  each      Knee/Hip Exercises: Seated   Long Arc Quad  Strengthening;Both;2 sets;10 reps    Sit to [employer]  2 sets;10 reps;without UE support      Knee/Hip Exercises: Supine   Bridges  Strengthening;Both;2 sets;10 reps    Bridges Limitations  ball squeeze      Knee/Hip Exercises: Sidelying   Clams  x10 each             PT Education - 04/30/18 1514    Education Details  Access Code: [email or web address]     Person(s) Educated  Patient    Methods  Explanation;Demonstration;Handout    Comprehension  Verbalized understanding;Returned demonstration       PT Short Term Goals - 04/30/18 1453      PT SHORT TERM GOAL #1   Title  be independent in initial HEP    Status  Achieved      PT SHORT TERM GOAL #2   Title  squat with ADLs and report 25% reduction in Lt knee pain  Baseline  30-40% improvement    Status  Achieved      PT SHORT TERM GOAL #3   Title  negotiate steps with step-over-step with < or = to 2/10 Lt knee pain    Status  Achieved        PT Long Term Goals - 04/16/18 1236      PT LONG TERM GOAL #1   Title  be independent in advanced HEP    Time  8    Period  Weeks    Status  New    Target Date  06/11/18      PT LONG TERM GOAL #2   Title  reduce FOTO to < or = to 29% limitation    Time  8    Period  Weeks    Status  New    Target Date  06/11/18      PT LONG TERM GOAL #3   Title  squat with ADLs with 70% less Lt knee pain    Time  8    Period  Weeks    Status  New    Target Date  06/11/18      PT LONG TERM GOAL #4   Title  return to gardening on knees without limitation    Time  8    Period  Weeks    Status  New    Target Date  06/11/18      PT LONG TERM GOAL #5   Title  demonstrate normalized step length with knee extension at heel strike and stance phase of gait    Time  8    Period  Weeks    Status  New    Target Date  06/11/18            Plan - 04/30/18 1459    Clinical Impression Statement  Pt reports 30-40% overall improvement in Lt  knee pain since the start of care.  Pt with Rt lateral leg pain today that is chronic in nature.  Pt has returned to squatting and negotiating steps with step over step gait pattern without increased pain. Pt has returned to [employer] exercise without limitation. Pt demonstrates improved step length and heel strike with gait on level surface.  Pt requires minor verbal cues for technique with exercise in the clinic.  Pt will continue to benefit from skilled PT for knee and hip strength and flexibility.      Rehab Potential  Excellent    PT Frequency  1x / week    PT Duration  8 weeks    PT Treatment/Interventions  ADLs/Self Care Home Management;Cryotherapy;Electrical Stimulation;Ultrasound;Iontophoresis 4mg /ml Dexamethasone;Stair training;Functional mobility training;Therapeutic activities;Therapeutic exercise;Patient/family education;Neuromuscular re-education;Manual techniques;Passive range of motion;Taping;Dry needling    PT Next Visit Plan  gastroc flexibility, gait training for improved heel strike and step length, balance/proprioception    PT Home Exercise Plan   Access Code: [email or web address]     Recommended Other Services  initial certification is signed    Consulted and Agree with Plan of Care  Patient       Patient will benefit from skilled therapeutic intervention in order to improve the following deficits and impairments:  Abnormal gait, Pain, Postural dysfunction, Increased edema, Decreased range of motion, Decreased activity tolerance  Visit Diagnosis: Chronic pain of left knee  Chronic pain of right knee  Other abnormalities of gait and mobility  Muscle weakness (generalized)     Problem List Patient Active Problem List   Diagnosis Date Noted  .  Hemoptysis 09/12/2016    Sigurd Sos, PT 04/30/18 3:29 PM  [facility] [address], [place], [place], [address] Phone: [phone]   Fax:  [phone]  Name:  Christina Patton MRN: [id number] Date of Birth: 1946/01/31

## 2018-05-07 ENCOUNTER — Ambulatory Visit: Payer: Medicare HMO

## 2018-05-07 DIAGNOSIS — M25562 Pain in left knee: Principal | ICD-10-CM

## 2018-05-07 DIAGNOSIS — M25561 Pain in right knee: Secondary | ICD-10-CM | POA: Diagnosis not present

## 2018-05-07 DIAGNOSIS — M6281 Muscle weakness (generalized): Secondary | ICD-10-CM | POA: Diagnosis not present

## 2018-05-07 DIAGNOSIS — G8929 Other chronic pain: Secondary | ICD-10-CM

## 2018-05-07 DIAGNOSIS — R2689 Other abnormalities of gait and mobility: Secondary | ICD-10-CM | POA: Diagnosis not present

## 2018-05-07 NOTE — Therapy (Signed)
[facility] [address] [place], [place], [address] Phone: [phone]   Fax:  [phone]  Physical Therapy Treatment  Patient Details  Name: Christina Patton MRN: [id number] Date of Birth: 29-Sep-1945 Referring Provider (PT): Cari Caraway, MD   Encounter Date: 05/07/2018  PT End of Session - 05/07/18 1229    Visit Number  4    Date for PT Re-Evaluation  06/11/18    Authorization Type  aetna Medicare    PT Start Time  1146    PT Stop Time  1225    PT Time Calculation (min)  39 min    Activity Tolerance  Patient tolerated treatment well    Behavior During Therapy  [facility] for tasks assessed/performed       Past Medical History:  Diagnosis Date  . Anxiety   . Cancer ([place])    skin cancer on face  . Osteoporosis     History reviewed. No pertinent surgical history.  There were no vitals filed for this visit.  Subjective Assessment - 05/07/18 1203    Subjective  I am sleeping better.  My knees feel 50% better.      Patient Stated Goals  reduce Lt knee pain, reduce knee swelling    Currently in Pain?  No/denies                       [facility] Adult PT Treatment/Exercise - 05/07/18 0001      Exercises   Exercises  Knee/Hip      Knee/Hip Exercises: Stretches   Active Hamstring Stretch  Both;2 reps;20 seconds    Hip Flexor Stretch  Both;3 reps;20 seconds   standing     Knee/Hip Exercises: Aerobic   Nustep  Level 2x 8 minutes    PT present to discuss progress     Knee/Hip Exercises: Standing   Hip Abduction  Stengthening;Both;2 sets;10 reps   2#   Abduction Limitations  2# added- standing on black pad    Hip Extension  Stengthening;Both;2 sets;10 reps    Extension Limitations  2# added- standing on balance    Rocker Board  3 minutes    Rebounder  weight shifting 3 ways x 1 minute each    Walking with Sports Cord  20# forward x 10      Knee/Hip Exercises: Seated   Long Arc Quad   Strengthening;Both;2 sets;10 reps    Long Arc Quad Weight  2 lbs.    Sit to Sand  2 sets;10 reps;without UE support      Knee/Hip Exercises: Supine   Bridges  Strengthening;Both;2 sets;10 reps    Bridges Limitations  ball squeeze      Knee/Hip Exercises: Sidelying   Clams  x10 each- yellow band               PT Short Term Goals - 05/07/18 1203      PT SHORT TERM GOAL #1   Title  be independent in initial HEP    Status  Achieved      PT SHORT TERM GOAL #2   Title  squat with ADLs and report 25% reduction in Lt knee pain    Status  Achieved      PT SHORT TERM GOAL #3   Title  negotiate steps with step-over-step with < or = to 2/10 Lt knee pain    Status  Achieved        PT Long Term Goals -  05/07/18 1203      PT LONG TERM GOAL #1   Title  be independent in advanced HEP    Time  8    Period  Weeks    Status  On-going      PT LONG TERM GOAL #3   Title  squat with ADLs with 70% less Lt knee pain    Baseline  50% less Lt knee pain- no pain today with this    Time  8    Period  Weeks    Status  On-going      PT LONG TERM GOAL #4   Title  return to gardening on knees without limitation      PT LONG TERM GOAL #5   Title  demonstrate normalized step length with knee extension at heel strike and stance phase of gait    Baseline  still working on this, sometimes shuffles    Time  8    Period  Weeks    Status  On-going            Plan - 05/07/18 1214    Clinical Impression Statement  Pt reports 50-60% overall improvement in bil knee pain since the start of care.  Pt has been doing yardwork but not over the past few days so not able to determine if this is painful.  Gait is normalized on level surface in the clinic and pt reports that she starts to shuffle when she is fatigued.  Pt was able to tolerate advancement of exercise with standing on balance pad and ankle weights.  Pt requires stand by assist for safety with resisted walking today.  Pt will likely D/C  next session to HEP.      Rehab Potential  Excellent    PT Frequency  1x / week    PT Duration  8 weeks    PT Treatment/Interventions  ADLs/Self Care Home Management;Cryotherapy;Electrical Stimulation;Ultrasound;Iontophoresis 4mg /ml Dexamethasone;Stair training;Functional mobility training;Therapeutic activities;Therapeutic exercise;Patient/family education;Neuromuscular re-education;Manual techniques;Passive range of motion;Taping;Dry needling    PT Next Visit Plan  gastroc flexibility, gait training for improved heel strike and step length, balance/proprioception    PT Home Exercise Plan   Access Code: [email or web address]     Consulted and Agree with Plan of Care  Patient       Patient will benefit from skilled therapeutic intervention in order to improve the following deficits and impairments:  Abnormal gait, Pain, Postural dysfunction, Increased edema, Decreased range of motion, Decreased activity tolerance  Visit Diagnosis: Chronic pain of left knee  Chronic pain of right knee  Other abnormalities of gait and mobility  Muscle weakness (generalized)     Problem List Patient Active Problem List   Diagnosis Date Noted  . Hemoptysis 09/12/2016    Christina Patton 05/07/2018, 12:30 PM  [facility] [address], [place], [place], [address] Phone: [phone]   Fax:  [phone]  Name: Christina Patton MRN: [id number] Date of Birth: 1946-07-07

## 2018-05-14 ENCOUNTER — Ambulatory Visit: Payer: Self-pay

## 2018-05-29 ENCOUNTER — Ambulatory Visit: Payer: Medicare HMO

## 2018-05-29 DIAGNOSIS — G8929 Other chronic pain: Secondary | ICD-10-CM

## 2018-05-29 DIAGNOSIS — R2689 Other abnormalities of gait and mobility: Secondary | ICD-10-CM

## 2018-05-29 DIAGNOSIS — M25562 Pain in left knee: Secondary | ICD-10-CM | POA: Diagnosis not present

## 2018-05-29 DIAGNOSIS — M6281 Muscle weakness (generalized): Secondary | ICD-10-CM | POA: Diagnosis not present

## 2018-05-29 DIAGNOSIS — M25561 Pain in right knee: Secondary | ICD-10-CM

## 2018-05-29 NOTE — Therapy (Signed)
[facility] [address] [place], [place], [address] Phone: [phone]   Fax:  [phone]  Physical Therapy Treatment  Patient Details  Name: Christina Patton MRN: [id number] Date of Birth: 09-15-1945 Referring Provider (PT): Cari Caraway, MD   Encounter Date: 05/29/2018  PT End of Session - 05/29/18 1139    Visit Number  5    Date for PT Re-Evaluation  06/11/18    Authorization Type  aetna Medicare    PT Start Time  1101    PT Stop Time  1141    PT Time Calculation (min)  40 min    Activity Tolerance  Patient tolerated treatment well    Behavior During Therapy  [facility] for tasks assessed/performed       Past Medical History:  Diagnosis Date  . Anxiety   . Cancer ([place])    skin cancer on face  . Osteoporosis     History reviewed. No pertinent surgical history.  There were no vitals filed for this visit.  Subjective Assessment - 05/29/18 1103    Subjective  I feel 60% better overall.  I have all the exercises and they are helping.      Patient Stated Goals  reduce Lt knee pain, reduce knee swelling    Currently in Pain?  Yes    Pain Score  1     Pain Location  Knee    Pain Orientation  Left    Pain Descriptors / Indicators  Discomfort    Pain Type  Chronic pain    Pain Onset  More than a month ago    Pain Frequency  Constant    Aggravating Factors   early morning, steps    Pain Relieving Factors  topical rub         [facility] PT Assessment - 05/29/18 0001      Assessment   Medical Diagnosis  pain in Lt knee, chronic knee pain with Lt knee acute exacerbation    Referring Provider (PT)  Cari Caraway, MD    Onset Date/Surgical Date  04/02/18      Observation/Other Assessments   Focus on Therapeutic Outcomes (FOTO)   29% limitation      Palpation   Palpation comment  no palpable tenderness today      Ambulation/Gait   Ambulation/Gait  Yes    Ambulation/Gait Assistance  7: Independent    Gait  Pattern  Step-through pattern;Decreased stride length;Decreased dorsiflexion - right;Decreased dorsiflexion - left                   [facility] Adult PT Treatment/Exercise - 05/29/18 0001      Knee/Hip Exercises: Stretches   Active Hamstring Stretch  Both;2 reps;20 seconds    Hip Flexor Stretch  Both;3 reps;20 seconds   standing     Knee/Hip Exercises: Aerobic   Nustep  Level 2x 8 minutes    PT present to discuss progress     Knee/Hip Exercises: Standing   Hip Abduction  Stengthening;Both;2 sets;10 reps   2#   Abduction Limitations  2# added- standing on black pad    Hip Extension  Stengthening;Both;2 sets;10 reps    Extension Limitations  2# added- standing on balance    Rocker Board  3 minutes    Rebounder  weight shifting 3 ways x 1 minute each      Knee/Hip Exercises: Seated   Long Arc Quad  Strengthening;Both;2 sets;10 reps    Long [employer]  Weight  2 lbs.    Sit to Sand  2 sets;10 reps;without UE support      Knee/Hip Exercises: Sidelying   Clams  x10 each- yellow band               PT Short Term Goals - 05/07/18 1203      PT SHORT TERM GOAL #1   Title  be independent in initial HEP    Status  Achieved      PT SHORT TERM GOAL #2   Title  squat with ADLs and report 25% reduction in Lt knee pain    Status  Achieved      PT SHORT TERM GOAL #3   Title  negotiate steps with step-over-step with < or = to 2/10 Lt knee pain    Status  Achieved        PT Long Term Goals - 05/29/18 1111      PT LONG TERM GOAL #1   Title  be independent in advanced HEP    Status  Achieved      PT LONG TERM GOAL #2   Title  reduce FOTO to < or = to 29% limitation    Baseline  29% limitation    Status  Achieved      PT LONG TERM GOAL #3   Title  squat with ADLs with 70% less Lt knee pain    Baseline  60% less pain    Status  Partially Met      PT LONG TERM GOAL #4   Title  return to gardening on knees without limitation    Status  Achieved      PT LONG TERM  GOAL #5   Title  demonstrate normalized step length with knee extension at heel strike and stance phase of gait    Baseline  still working on this, sometimes shuffles    Status  Partially Met            Plan - 05/29/18 1119    Clinical Impression Statement  Pt reports 60% overall improvement in knee pain and symptoms since the start of care. Pt has HEP in place and is working to strengthen knees daily and goes to [employer] without limitation.  Pt with good demo of all HEP without need for significant cueing for technique.  Pt demonstrates improved heel to toe with gait and reports that she is working on greater consistency with this.  Pt will D/C to HEP today.      PT Next Visit Plan  D/C PT to HEP    PT Home Exercise Plan   Access Code: [email or web address]     Consulted and Agree with Plan of Care  Patient       Patient will benefit from skilled therapeutic intervention in order to improve the following deficits and impairments:     Visit Diagnosis: Chronic pain of left knee  Chronic pain of right knee  Other abnormalities of gait and mobility  Muscle weakness (generalized)     Problem List Patient Active Problem List   Diagnosis Date Noted  . Hemoptysis 09/12/2016   PHYSICAL THERAPY DISCHARGE SUMMARY  Visits from Start of Care: 5  Current functional level related to goals / functional outcomes: See above for current status.   Remaining deficits: See above   Education / Equipment: HEP Plan: Patient agrees to discharge.  Patient goals were partially met. Patient is being discharged due to being pleased with the current functional level.  ?????  Sigurd Sos, PT 05/29/18 11:43 AM  [facility] [address] [place], [place], [address] Phone: [phone]   Fax:  [phone]  Name: Christina Patton MRN: [id number] Date of Birth: 07-30-1946

## 2018-06-19 DIAGNOSIS — G479 Sleep disorder, unspecified: Secondary | ICD-10-CM | POA: Diagnosis not present

## 2018-06-19 DIAGNOSIS — J069 Acute upper respiratory infection, unspecified: Secondary | ICD-10-CM | POA: Diagnosis not present

## 2018-06-19 DIAGNOSIS — B9789 Other viral agents as the cause of diseases classified elsewhere: Secondary | ICD-10-CM | POA: Diagnosis not present

## 2018-06-19 DIAGNOSIS — R042 Hemoptysis: Secondary | ICD-10-CM | POA: Diagnosis not present

## 2018-09-08 DIAGNOSIS — R300 Dysuria: Secondary | ICD-10-CM | POA: Diagnosis not present

## 2018-09-09 DIAGNOSIS — R69 Illness, unspecified: Secondary | ICD-10-CM | POA: Diagnosis not present

## 2018-11-21 DIAGNOSIS — R69 Illness, unspecified: Secondary | ICD-10-CM | POA: Diagnosis not present

## 2018-12-29 DIAGNOSIS — H25013 Cortical age-related cataract, bilateral: Secondary | ICD-10-CM | POA: Diagnosis not present

## 2018-12-29 DIAGNOSIS — H40013 Open angle with borderline findings, low risk, bilateral: Secondary | ICD-10-CM | POA: Diagnosis not present

## 2018-12-29 DIAGNOSIS — K5901 Slow transit constipation: Secondary | ICD-10-CM | POA: Diagnosis not present

## 2018-12-29 DIAGNOSIS — H2513 Age-related nuclear cataract, bilateral: Secondary | ICD-10-CM | POA: Diagnosis not present

## 2018-12-29 DIAGNOSIS — H524 Presbyopia: Secondary | ICD-10-CM | POA: Diagnosis not present

## 2018-12-29 DIAGNOSIS — Z0000 Encounter for general adult medical examination without abnormal findings: Secondary | ICD-10-CM | POA: Diagnosis not present

## 2018-12-29 DIAGNOSIS — R69 Illness, unspecified: Secondary | ICD-10-CM | POA: Diagnosis not present

## 2018-12-29 DIAGNOSIS — G479 Sleep disorder, unspecified: Secondary | ICD-10-CM | POA: Diagnosis not present

## 2018-12-29 DIAGNOSIS — H43813 Vitreous degeneration, bilateral: Secondary | ICD-10-CM | POA: Diagnosis not present

## 2018-12-29 DIAGNOSIS — Z8041 Family history of malignant neoplasm of ovary: Secondary | ICD-10-CM | POA: Diagnosis not present

## 2018-12-29 DIAGNOSIS — Z136 Encounter for screening for cardiovascular disorders: Secondary | ICD-10-CM | POA: Diagnosis not present

## 2018-12-29 DIAGNOSIS — M2550 Pain in unspecified joint: Secondary | ICD-10-CM | POA: Diagnosis not present

## 2018-12-29 DIAGNOSIS — M85851 Other specified disorders of bone density and structure, right thigh: Secondary | ICD-10-CM | POA: Diagnosis not present

## 2018-12-29 DIAGNOSIS — Z1159 Encounter for screening for other viral diseases: Secondary | ICD-10-CM | POA: Diagnosis not present

## 2018-12-29 DIAGNOSIS — M79651 Pain in right thigh: Secondary | ICD-10-CM | POA: Diagnosis not present

## 2018-12-30 ENCOUNTER — Other Ambulatory Visit: Payer: Self-pay | Admitting: Family Medicine

## 2018-12-30 DIAGNOSIS — Z1231 Encounter for screening mammogram for malignant neoplasm of breast: Secondary | ICD-10-CM

## 2019-01-27 DIAGNOSIS — R69 Illness, unspecified: Secondary | ICD-10-CM | POA: Diagnosis not present

## 2019-01-28 DIAGNOSIS — N390 Urinary tract infection, site not specified: Secondary | ICD-10-CM | POA: Diagnosis not present

## 2019-02-13 ENCOUNTER — Other Ambulatory Visit: Payer: Self-pay

## 2019-02-13 ENCOUNTER — Ambulatory Visit
Admission: RE | Admit: 2019-02-13 | Discharge: 2019-02-13 | Disposition: A | Discharge status: Home / Self Care | Payer: Medicare HMO | Source: Ambulatory Visit | Attending: Family Medicine | Admitting: Family Medicine

## 2019-02-13 DIAGNOSIS — Z1231 Encounter for screening mammogram for malignant neoplasm of breast: Secondary | ICD-10-CM

## 2019-04-08 DIAGNOSIS — R69 Illness, unspecified: Secondary | ICD-10-CM | POA: Diagnosis not present

## 2019-04-09 DIAGNOSIS — Z0100 Encounter for examination of eyes and vision without abnormal findings: Secondary | ICD-10-CM | POA: Diagnosis not present

## 2019-08-11 ENCOUNTER — Ambulatory Visit: Payer: Medicare Other | Attending: Internal Medicine

## 2019-08-11 DIAGNOSIS — H25013 Cortical age-related cataract, bilateral: Secondary | ICD-10-CM | POA: Diagnosis not present

## 2019-08-11 DIAGNOSIS — H16223 Keratoconjunctivitis sicca, not specified as Sjogren's, bilateral: Secondary | ICD-10-CM | POA: Diagnosis not present

## 2019-08-11 DIAGNOSIS — Z23 Encounter for immunization: Secondary | ICD-10-CM

## 2019-08-11 DIAGNOSIS — H2513 Age-related nuclear cataract, bilateral: Secondary | ICD-10-CM | POA: Diagnosis not present

## 2019-08-11 DIAGNOSIS — H40013 Open angle with borderline findings, low risk, bilateral: Secondary | ICD-10-CM | POA: Diagnosis not present

## 2019-08-11 NOTE — Progress Notes (Signed)
   Covid-19 Vaccination Clinic  Name:  Christina Patton    MRN: [id number] DOB: Mar 23, 1946  08/11/2019  [name] was observed post Covid-19 immunization for 15 minutes without incidence. She was provided with Vaccine Information Sheet and instruction to access the V-Safe system.   [name] was instructed to call 911 with any severe reactions post vaccine: Marland Kitchen Difficulty breathing  . Swelling of your face and throat  . A fast heartbeat  . A bad rash all over your body  . Dizziness and weakness    Immunizations Administered    Name Date Dose VIS Date Route   Pfizer COVID-19 Vaccine 08/11/2019 10:00 AM 0.3 mL 07/10/2019 Intramuscular   Manufacturer: [employer]   Lot: [id number]   [place]: [id number]

## 2019-08-31 ENCOUNTER — Ambulatory Visit: Payer: Medicare HMO | Attending: Internal Medicine

## 2019-08-31 DIAGNOSIS — Z23 Encounter for immunization: Secondary | ICD-10-CM

## 2019-08-31 NOTE — Progress Notes (Signed)
   Covid-19 Vaccination Clinic  Name:  Christina Patton    MRN: [id number] DOB: May 01, 1946  08/31/2019  [name] was observed post Covid-19 immunization for 15 minutes without incidence. She was provided with Vaccine Information Sheet and instruction to access the V-Safe system.   [name] was instructed to call 911 with any severe reactions post vaccine: Marland Kitchen Difficulty breathing  . Swelling of your face and throat  . A fast heartbeat  . A bad rash all over your body  . Dizziness and weakness    Immunizations Administered    Name Date Dose VIS Date Route   Pfizer COVID-19 Vaccine 08/31/2019  9:30 AM 0.3 mL 07/10/2019 Intramuscular   Manufacturer: [place]   Lot: [id number]   [place]: [id number]

## 2019-09-08 DIAGNOSIS — H2512 Age-related nuclear cataract, left eye: Secondary | ICD-10-CM | POA: Diagnosis not present

## 2019-09-08 DIAGNOSIS — H40013 Open angle with borderline findings, low risk, bilateral: Secondary | ICD-10-CM | POA: Diagnosis not present

## 2019-09-08 DIAGNOSIS — H25042 Posterior subcapsular polar age-related cataract, left eye: Secondary | ICD-10-CM | POA: Diagnosis not present

## 2019-09-08 DIAGNOSIS — H2513 Age-related nuclear cataract, bilateral: Secondary | ICD-10-CM | POA: Diagnosis not present

## 2019-09-08 DIAGNOSIS — H25013 Cortical age-related cataract, bilateral: Secondary | ICD-10-CM | POA: Diagnosis not present

## 2019-09-30 DIAGNOSIS — H25042 Posterior subcapsular polar age-related cataract, left eye: Secondary | ICD-10-CM | POA: Diagnosis not present

## 2019-09-30 DIAGNOSIS — H25812 Combined forms of age-related cataract, left eye: Secondary | ICD-10-CM | POA: Diagnosis not present

## 2019-09-30 DIAGNOSIS — H2512 Age-related nuclear cataract, left eye: Secondary | ICD-10-CM | POA: Diagnosis not present

## 2019-10-06 DIAGNOSIS — H25011 Cortical age-related cataract, right eye: Secondary | ICD-10-CM | POA: Diagnosis not present

## 2019-10-06 DIAGNOSIS — H2511 Age-related nuclear cataract, right eye: Secondary | ICD-10-CM | POA: Diagnosis not present

## 2019-10-14 DIAGNOSIS — H25011 Cortical age-related cataract, right eye: Secondary | ICD-10-CM | POA: Diagnosis not present

## 2019-10-14 DIAGNOSIS — H25811 Combined forms of age-related cataract, right eye: Secondary | ICD-10-CM | POA: Diagnosis not present

## 2019-10-14 DIAGNOSIS — H2511 Age-related nuclear cataract, right eye: Secondary | ICD-10-CM | POA: Diagnosis not present

## 2019-10-21 DIAGNOSIS — M940 Chondrocostal junction syndrome [Tietze]: Secondary | ICD-10-CM | POA: Diagnosis not present

## 2019-12-30 DIAGNOSIS — M85851 Other specified disorders of bone density and structure, right thigh: Secondary | ICD-10-CM | POA: Diagnosis not present

## 2019-12-30 DIAGNOSIS — Z8041 Family history of malignant neoplasm of ovary: Secondary | ICD-10-CM | POA: Diagnosis not present

## 2019-12-30 DIAGNOSIS — Z131 Encounter for screening for diabetes mellitus: Secondary | ICD-10-CM | POA: Diagnosis not present

## 2019-12-31 DIAGNOSIS — G479 Sleep disorder, unspecified: Secondary | ICD-10-CM | POA: Diagnosis not present

## 2019-12-31 DIAGNOSIS — Z0000 Encounter for general adult medical examination without abnormal findings: Secondary | ICD-10-CM | POA: Diagnosis not present

## 2019-12-31 DIAGNOSIS — Z01419 Encounter for gynecological examination (general) (routine) without abnormal findings: Secondary | ICD-10-CM | POA: Diagnosis not present

## 2019-12-31 DIAGNOSIS — R69 Illness, unspecified: Secondary | ICD-10-CM | POA: Diagnosis not present

## 2019-12-31 DIAGNOSIS — M79604 Pain in right leg: Secondary | ICD-10-CM | POA: Diagnosis not present

## 2019-12-31 DIAGNOSIS — K5901 Slow transit constipation: Secondary | ICD-10-CM | POA: Diagnosis not present

## 2019-12-31 DIAGNOSIS — Z8041 Family history of malignant neoplasm of ovary: Secondary | ICD-10-CM | POA: Diagnosis not present

## 2019-12-31 DIAGNOSIS — M85851 Other specified disorders of bone density and structure, right thigh: Secondary | ICD-10-CM | POA: Diagnosis not present

## 2019-12-31 DIAGNOSIS — Z86010 Personal history of colonic polyps: Secondary | ICD-10-CM | POA: Diagnosis not present

## 2019-12-31 DIAGNOSIS — Z124 Encounter for screening for malignant neoplasm of cervix: Secondary | ICD-10-CM | POA: Diagnosis not present

## 2020-01-13 ENCOUNTER — Other Ambulatory Visit: Payer: Self-pay | Admitting: Family Medicine

## 2020-01-13 DIAGNOSIS — Z1231 Encounter for screening mammogram for malignant neoplasm of breast: Secondary | ICD-10-CM

## 2020-02-03 DIAGNOSIS — R001 Bradycardia, unspecified: Secondary | ICD-10-CM | POA: Diagnosis not present

## 2020-02-03 DIAGNOSIS — T63441A Toxic effect of venom of bees, accidental (unintentional), initial encounter: Secondary | ICD-10-CM | POA: Diagnosis not present

## 2020-02-15 ENCOUNTER — Ambulatory Visit: Payer: Medicare HMO

## 2020-02-23 ENCOUNTER — Ambulatory Visit
Admission: RE | Admit: 2020-02-23 | Discharge: 2020-02-23 | Disposition: A | Discharge status: Home / Self Care | Payer: Medicare HMO | Source: Ambulatory Visit | Attending: Family Medicine | Admitting: Family Medicine

## 2020-02-23 ENCOUNTER — Other Ambulatory Visit: Payer: Self-pay

## 2020-02-23 DIAGNOSIS — Z1231 Encounter for screening mammogram for malignant neoplasm of breast: Secondary | ICD-10-CM | POA: Diagnosis not present

## 2020-02-26 DIAGNOSIS — D1801 Hemangioma of skin and subcutaneous tissue: Secondary | ICD-10-CM | POA: Diagnosis not present

## 2020-02-26 DIAGNOSIS — L821 Other seborrheic keratosis: Secondary | ICD-10-CM | POA: Diagnosis not present

## 2020-02-26 DIAGNOSIS — Z85828 Personal history of other malignant neoplasm of skin: Secondary | ICD-10-CM | POA: Diagnosis not present

## 2020-02-26 DIAGNOSIS — D2362 Other benign neoplasm of skin of left upper limb, including shoulder: Secondary | ICD-10-CM | POA: Diagnosis not present

## 2020-02-26 DIAGNOSIS — L814 Other melanin hyperpigmentation: Secondary | ICD-10-CM | POA: Diagnosis not present

## 2020-02-26 DIAGNOSIS — L570 Actinic keratosis: Secondary | ICD-10-CM | POA: Diagnosis not present

## 2020-02-26 DIAGNOSIS — B351 Tinea unguium: Secondary | ICD-10-CM | POA: Diagnosis not present

## 2020-02-26 DIAGNOSIS — D2272 Melanocytic nevi of left lower limb, including hip: Secondary | ICD-10-CM | POA: Diagnosis not present

## 2020-02-26 DIAGNOSIS — L720 Epidermal cyst: Secondary | ICD-10-CM | POA: Diagnosis not present

## 2020-02-26 DIAGNOSIS — D225 Melanocytic nevi of trunk: Secondary | ICD-10-CM | POA: Diagnosis not present

## 2020-03-31 DIAGNOSIS — R69 Illness, unspecified: Secondary | ICD-10-CM | POA: Diagnosis not present

## 2020-04-27 DIAGNOSIS — M170 Bilateral primary osteoarthritis of knee: Secondary | ICD-10-CM | POA: Diagnosis not present

## 2020-04-27 DIAGNOSIS — Z23 Encounter for immunization: Secondary | ICD-10-CM | POA: Diagnosis not present

## 2020-05-05 DIAGNOSIS — Z961 Presence of intraocular lens: Secondary | ICD-10-CM | POA: Diagnosis not present

## 2020-05-05 DIAGNOSIS — H35412 Lattice degeneration of retina, left eye: Secondary | ICD-10-CM | POA: Diagnosis not present

## 2020-05-05 DIAGNOSIS — H04123 Dry eye syndrome of bilateral lacrimal glands: Secondary | ICD-10-CM | POA: Diagnosis not present

## 2020-05-05 DIAGNOSIS — H40013 Open angle with borderline findings, low risk, bilateral: Secondary | ICD-10-CM | POA: Diagnosis not present

## 2020-07-13 DIAGNOSIS — Z86010 Personal history of colonic polyps: Secondary | ICD-10-CM | POA: Diagnosis not present

## 2020-07-13 DIAGNOSIS — K5901 Slow transit constipation: Secondary | ICD-10-CM | POA: Diagnosis not present

## 2020-08-05 DIAGNOSIS — M85851 Other specified disorders of bone density and structure, right thigh: Secondary | ICD-10-CM | POA: Diagnosis not present

## 2020-08-05 DIAGNOSIS — F3342 Major depressive disorder, recurrent, in full remission: Secondary | ICD-10-CM | POA: Diagnosis not present

## 2020-08-05 DIAGNOSIS — K5901 Slow transit constipation: Secondary | ICD-10-CM | POA: Diagnosis not present

## 2020-08-05 DIAGNOSIS — M25551 Pain in right hip: Secondary | ICD-10-CM | POA: Diagnosis not present

## 2020-08-05 DIAGNOSIS — M25569 Pain in unspecified knee: Secondary | ICD-10-CM | POA: Diagnosis not present

## 2020-08-05 DIAGNOSIS — R69 Illness, unspecified: Secondary | ICD-10-CM | POA: Diagnosis not present

## 2020-09-08 DIAGNOSIS — Z20822 Contact with and (suspected) exposure to covid-19: Secondary | ICD-10-CM | POA: Diagnosis not present

## 2020-09-13 DIAGNOSIS — D124 Benign neoplasm of descending colon: Secondary | ICD-10-CM | POA: Diagnosis not present

## 2020-09-13 DIAGNOSIS — K640 First degree hemorrhoids: Secondary | ICD-10-CM | POA: Diagnosis not present

## 2020-09-13 DIAGNOSIS — Z86010 Personal history of colonic polyps: Secondary | ICD-10-CM | POA: Diagnosis not present

## 2020-09-16 DIAGNOSIS — D124 Benign neoplasm of descending colon: Secondary | ICD-10-CM | POA: Diagnosis not present

## 2020-09-23 DIAGNOSIS — Z68.41 Body mass index (BMI) pediatric, 5th percentile to less than 85th percentile for age: Secondary | ICD-10-CM | POA: Diagnosis not present

## 2020-09-23 DIAGNOSIS — R3915 Urgency of urination: Secondary | ICD-10-CM | POA: Diagnosis not present

## 2020-10-04 DIAGNOSIS — D485 Neoplasm of uncertain behavior of skin: Secondary | ICD-10-CM | POA: Diagnosis not present

## 2020-10-04 DIAGNOSIS — L409 Psoriasis, unspecified: Secondary | ICD-10-CM | POA: Diagnosis not present

## 2020-10-04 DIAGNOSIS — L400 Psoriasis vulgaris: Secondary | ICD-10-CM | POA: Diagnosis not present

## 2020-10-04 DIAGNOSIS — L570 Actinic keratosis: Secondary | ICD-10-CM | POA: Diagnosis not present

## 2020-10-04 DIAGNOSIS — L308 Other specified dermatitis: Secondary | ICD-10-CM | POA: Diagnosis not present

## 2020-10-04 DIAGNOSIS — D225 Melanocytic nevi of trunk: Secondary | ICD-10-CM | POA: Diagnosis not present

## 2020-10-04 DIAGNOSIS — R21 Rash and other nonspecific skin eruption: Secondary | ICD-10-CM | POA: Diagnosis not present

## 2020-10-17 DIAGNOSIS — M21961 Unspecified acquired deformity of right lower leg: Secondary | ICD-10-CM | POA: Diagnosis not present

## 2020-10-17 DIAGNOSIS — Z6823 Body mass index (BMI) 23.0-23.9, adult: Secondary | ICD-10-CM | POA: Diagnosis not present

## 2020-10-17 DIAGNOSIS — M79671 Pain in right foot: Secondary | ICD-10-CM | POA: Diagnosis not present

## 2020-10-25 ENCOUNTER — Encounter: Payer: Self-pay | Admitting: Podiatry

## 2020-10-25 ENCOUNTER — Ambulatory Visit (INDEPENDENT_AMBULATORY_CARE_PROVIDER_SITE_OTHER): Payer: Medicare HMO

## 2020-10-25 ENCOUNTER — Ambulatory Visit: Payer: Medicare HMO | Admitting: Podiatry

## 2020-10-25 ENCOUNTER — Other Ambulatory Visit: Payer: Self-pay

## 2020-10-25 DIAGNOSIS — M2041 Other hammer toe(s) (acquired), right foot: Secondary | ICD-10-CM

## 2020-10-25 DIAGNOSIS — M79671 Pain in right foot: Secondary | ICD-10-CM

## 2020-10-25 NOTE — Patient Instructions (Signed)
More silicone pads can be purchased from:  [email or web address]  

## 2020-10-25 NOTE — Progress Notes (Signed)
  Subjective:  Patient ID: Christina Patton, female    DOB: [birth year],  MRN: [id number]  Chief Complaint  Patient presents with  . Toe Pain    Right foot 3-4 th toe pain    75 y.o. female presents with the above complaint. History confirmed with patient.  She previously had surgery on these many years ago  Objective:  Physical Exam: warm, good capillary refill, no trophic changes or ulcerative lesions, normal DP and PT pulses and normal sensory exam.   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 Right foot pain   2. Hammertoe of right foot      Plan:  Patient was evaluated and treated and all questions answered.  Discussed with radiology and treatment options for hammertoe deformities and how recurrent pain in the heloma molle relates to her prior surgery.  Discussed surgical nonsurgical treatment options.  I recommended padding and offloading today with silicone pads which I dispensed and should retaking strategies to prevent motion between the toes.  We also discussed surgical treatment with revisional surgery and possible partial syndactylization.  She will consider this and try the nonsurgical treatment first.  Return in 2 months for follow-up  Return in about 8 weeks (around 12/20/2020) for check on painful hammertoe.

## 2020-10-26 DIAGNOSIS — M25551 Pain in right hip: Secondary | ICD-10-CM | POA: Diagnosis not present

## 2020-10-26 DIAGNOSIS — M25569 Pain in unspecified knee: Secondary | ICD-10-CM | POA: Diagnosis not present

## 2020-10-26 DIAGNOSIS — M545 Low back pain, unspecified: Secondary | ICD-10-CM | POA: Diagnosis not present

## 2020-10-31 DIAGNOSIS — M25551 Pain in right hip: Secondary | ICD-10-CM | POA: Diagnosis not present

## 2020-10-31 DIAGNOSIS — M25569 Pain in unspecified knee: Secondary | ICD-10-CM | POA: Diagnosis not present

## 2020-10-31 DIAGNOSIS — M545 Low back pain, unspecified: Secondary | ICD-10-CM | POA: Diagnosis not present

## 2020-11-02 DIAGNOSIS — M545 Low back pain, unspecified: Secondary | ICD-10-CM | POA: Diagnosis not present

## 2020-11-02 DIAGNOSIS — M25551 Pain in right hip: Secondary | ICD-10-CM | POA: Diagnosis not present

## 2020-11-02 DIAGNOSIS — M25569 Pain in unspecified knee: Secondary | ICD-10-CM | POA: Diagnosis not present

## 2020-11-07 DIAGNOSIS — M545 Low back pain, unspecified: Secondary | ICD-10-CM | POA: Diagnosis not present

## 2020-11-07 DIAGNOSIS — M25569 Pain in unspecified knee: Secondary | ICD-10-CM | POA: Diagnosis not present

## 2020-11-07 DIAGNOSIS — M25551 Pain in right hip: Secondary | ICD-10-CM | POA: Diagnosis not present

## 2020-11-09 DIAGNOSIS — M25551 Pain in right hip: Secondary | ICD-10-CM | POA: Diagnosis not present

## 2020-11-09 DIAGNOSIS — M25569 Pain in unspecified knee: Secondary | ICD-10-CM | POA: Diagnosis not present

## 2020-11-09 DIAGNOSIS — M545 Low back pain, unspecified: Secondary | ICD-10-CM | POA: Diagnosis not present

## 2020-11-14 DIAGNOSIS — M25551 Pain in right hip: Secondary | ICD-10-CM | POA: Diagnosis not present

## 2020-11-14 DIAGNOSIS — M25569 Pain in unspecified knee: Secondary | ICD-10-CM | POA: Diagnosis not present

## 2020-11-14 DIAGNOSIS — M545 Low back pain, unspecified: Secondary | ICD-10-CM | POA: Diagnosis not present

## 2020-11-16 DIAGNOSIS — M25569 Pain in unspecified knee: Secondary | ICD-10-CM | POA: Diagnosis not present

## 2020-11-16 DIAGNOSIS — M545 Low back pain, unspecified: Secondary | ICD-10-CM | POA: Diagnosis not present

## 2020-11-16 DIAGNOSIS — M25551 Pain in right hip: Secondary | ICD-10-CM | POA: Diagnosis not present

## 2020-11-30 DIAGNOSIS — M25569 Pain in unspecified knee: Secondary | ICD-10-CM | POA: Diagnosis not present

## 2020-11-30 DIAGNOSIS — M25551 Pain in right hip: Secondary | ICD-10-CM | POA: Diagnosis not present

## 2020-11-30 DIAGNOSIS — M545 Low back pain, unspecified: Secondary | ICD-10-CM | POA: Diagnosis not present

## 2020-12-02 DIAGNOSIS — M545 Low back pain, unspecified: Secondary | ICD-10-CM | POA: Diagnosis not present

## 2020-12-02 DIAGNOSIS — M25551 Pain in right hip: Secondary | ICD-10-CM | POA: Diagnosis not present

## 2020-12-02 DIAGNOSIS — M25569 Pain in unspecified knee: Secondary | ICD-10-CM | POA: Diagnosis not present

## 2020-12-13 ENCOUNTER — Ambulatory Visit: Payer: Medicare HMO | Admitting: Podiatry

## 2020-12-15 ENCOUNTER — Ambulatory Visit: Payer: Medicare HMO | Admitting: Podiatry

## 2020-12-15 ENCOUNTER — Other Ambulatory Visit: Payer: Self-pay

## 2020-12-15 DIAGNOSIS — L84 Corns and callosities: Secondary | ICD-10-CM

## 2020-12-15 DIAGNOSIS — M2041 Other hammer toe(s) (acquired), right foot: Secondary | ICD-10-CM | POA: Diagnosis not present

## 2020-12-15 DIAGNOSIS — M7051 Other bursitis of knee, right knee: Secondary | ICD-10-CM

## 2020-12-15 DIAGNOSIS — M7052 Other bursitis of knee, left knee: Secondary | ICD-10-CM

## 2020-12-15 NOTE — Patient Instructions (Signed)
[facility] [address] [place],  [place]  [address]  Main: [phone]

## 2020-12-18 ENCOUNTER — Encounter: Payer: Self-pay | Admitting: Podiatry

## 2020-12-18 NOTE — Progress Notes (Signed)
  Subjective:  Patient ID: Christina Patton, female    DOB: 11/30/1945,  MRN: [id number]  Chief Complaint  Patient presents with  . Hammer Toe    8 wk f/u check on painful hammertoe    75 y.o. female presents with the above complaint. History confirmed with patient.  She has been offloading the cottonball and this is helped quite a bit  Objective:  Physical Exam: warm, good capillary refill, no trophic changes or ulcerative lesions, normal DP and PT pulses and normal sensory exam.   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 Hammertoe of right foot   2. Infrapatellar bursitis of both knees   3. Callus of foot      Plan:  Patient was evaluated and treated and all questions answered.  Discussed further treatment.  Recommend continued nonoperative care.  She will use cotton balls.  Return if it bothers her in the future or needs treatment.  She is having some knee pain on both sides and asked for referral to orthopedic surgery and I put this in for her  Return if symptoms worsen or fail to improve.

## 2020-12-19 ENCOUNTER — Ambulatory Visit (INDEPENDENT_AMBULATORY_CARE_PROVIDER_SITE_OTHER): Payer: Medicare HMO | Admitting: Family Medicine

## 2020-12-19 ENCOUNTER — Other Ambulatory Visit: Payer: Self-pay

## 2020-12-19 ENCOUNTER — Encounter: Payer: Self-pay | Admitting: Family Medicine

## 2020-12-19 DIAGNOSIS — M25562 Pain in left knee: Secondary | ICD-10-CM

## 2020-12-19 DIAGNOSIS — M25561 Pain in right knee: Secondary | ICD-10-CM | POA: Diagnosis not present

## 2020-12-19 DIAGNOSIS — R2681 Unsteadiness on feet: Secondary | ICD-10-CM

## 2020-12-19 MED ORDER — MELOXICAM 15 MG PO TABS: 7.5000 mg | ORAL_TABLET | Freq: Every day | ORAL | 6 refills | Status: DC | PRN

## 2020-12-19 NOTE — Progress Notes (Signed)
I called and advised the patient of the medication change.

## 2020-12-19 NOTE — Patient Instructions (Signed)
    Knees:  - Glucosamine Sulfate 1,000 mg twice daily  - Turmeric 500 mg twice daily  - Celebrex as-needed for more severe pain   Bones:  - Vitamin D3: 5,000 IU daily  - Vitamin K2:  100 mcg daily  - Magnesium 200-400 mg daily

## 2020-12-19 NOTE — Progress Notes (Signed)
   Office Visit Note   Patient: Christina Patton           Date of Birth: [birth year]           MRN: [id number] Visit Date: 12/19/2020 Requested by: Criselda Peaches, DPM [address] [place],  [place] [address] PCP: Cari Caraway, MD  Subjective: Chief Complaint  Patient presents with  . Right Knee - Pain    The right knee hurts all around the patella. Mainly hurts just to touch - not like the left knee.  . Left Knee - Pain    Had pain in the knee, around the patella, years ago. It went away. About 1 year ago, the pain returned in the same area, mainly medial. Cannot kneel to do her gardening. Hurts to sit long periods of time. Patient has noticed a little bit of swelling.    HPI: She is here with bilateral knee pain.  Symptoms started intermittently about 5 or 6 years ago.  She had pain that went away after a while, and then came back.  Pain is on the medial aspect of the left knee and the lateral aspect of the right knee.  No mechanical symptoms.  Pain is primarily with activities.  She has tried Voltaren gel but it did not make much difference.  She also feels like she has gait instability.  She went to physical therapy for her back, but they did not address her knees.  She has a history of osteoporosis and is on Fosamax for that.               ROS:   All other systems were reviewed and are negative.  Objective: Vital Signs: There were no vitals taken for this visit.  Physical Exam:  General:  Alert and oriented, in no acute distress. Pulm:  Breathing unlabored. Psy:  Normal mood, congruent affect.  Knees: No significant effusion in either knee.  Trace patellofemoral crepitus bilaterally.  The right knee is tender along the lateral joint line and the left knee is tender on the medial.  She has pain with McMurray's but no palpable click.  Ligaments feel stable.  Imaging: No results found.  Assessment & Plan: 1.  Bilateral knee pain, suspect due to osteoarthritis.  Cannot rule  out degenerative meniscus tears. -Discussed options and elected to refer her back to physical therapy for strengthening and gait stability. -Meloxicam as needed, glucosamine and turmeric. -If symptoms worsen, we will obtain x-rays and possibly consider gel injections.     Procedures: No procedures performed        PMFS History: Patient Active Problem List   Diagnosis Date Noted  . Hemoptysis 09/12/2016   Past Medical History:  Diagnosis Date  . Anxiety   . Cancer ([place])    skin cancer on face  . Osteoporosis     Family History  Problem Relation Age of Onset  . Ovarian cancer Mother     History reviewed. No pertinent surgical history. Social History   Occupational History  . Not on file  Tobacco Use  . Smoking status: Never Smoker  . Smokeless tobacco: Never Used  Substance and Sexual Activity  . Alcohol use: Not on file  . Drug use: Not on file  . Sexual activity: Not on file

## 2021-01-02 DIAGNOSIS — R262 Difficulty in walking, not elsewhere classified: Secondary | ICD-10-CM | POA: Diagnosis not present

## 2021-01-02 DIAGNOSIS — M25561 Pain in right knee: Secondary | ICD-10-CM | POA: Diagnosis not present

## 2021-01-02 DIAGNOSIS — M25562 Pain in left knee: Secondary | ICD-10-CM | POA: Diagnosis not present

## 2021-01-03 ENCOUNTER — Other Ambulatory Visit: Payer: Self-pay | Admitting: Family Medicine

## 2021-01-03 ENCOUNTER — Telehealth: Payer: Self-pay | Admitting: Family Medicine

## 2021-01-03 DIAGNOSIS — M25562 Pain in left knee: Secondary | ICD-10-CM

## 2021-01-03 DIAGNOSIS — M25561 Pain in right knee: Secondary | ICD-10-CM

## 2021-01-03 NOTE — Telephone Encounter (Signed)
Patient called advised her (PT) asked if she can get X-rays taken of both knees and forwarded over to the therapist.  Belva Crome    email address is [email or web address].  The phone number is [phone]   The fax# is [phone]    The number to contact patient is [phone]

## 2021-01-03 NOTE — Telephone Encounter (Signed)
We have not done x-rays.  We were going to try PT first.

## 2021-01-03 NOTE — Telephone Encounter (Signed)
Please advise 

## 2021-01-03 NOTE — Telephone Encounter (Signed)
I called and advised the patient that Dr. Junius Roads placed bilateral knee xray orders for [facility]. She may walk in for these and then ask them to put the images on a cd for you to take to the physical therapist. The radiologist's report will be sent to Dr. Junius Roads - we will let her know the findings.

## 2021-01-04 ENCOUNTER — Telehealth: Payer: Self-pay | Admitting: Family Medicine

## 2021-01-04 ENCOUNTER — Ambulatory Visit
Admission: RE | Admit: 2021-01-04 | Discharge: 2021-01-04 | Disposition: A | Discharge status: Home / Self Care | Payer: Medicare HMO | Source: Ambulatory Visit | Attending: Family Medicine | Admitting: Family Medicine

## 2021-01-04 DIAGNOSIS — M25562 Pain in left knee: Secondary | ICD-10-CM

## 2021-01-04 DIAGNOSIS — M1712 Unilateral primary osteoarthritis, left knee: Secondary | ICD-10-CM | POA: Diagnosis not present

## 2021-01-04 DIAGNOSIS — M1711 Unilateral primary osteoarthritis, right knee: Secondary | ICD-10-CM | POA: Diagnosis not present

## 2021-01-04 DIAGNOSIS — M25461 Effusion, right knee: Secondary | ICD-10-CM | POA: Diagnosis not present

## 2021-01-04 DIAGNOSIS — M25561 Pain in right knee: Secondary | ICD-10-CM

## 2021-01-04 MED ORDER — NABUMETONE 500 MG PO TABS: 500.0000 mg | ORAL_TABLET | Freq: Two times a day (BID) | ORAL | 3 refills | Status: DC | PRN

## 2021-01-04 NOTE — Telephone Encounter (Signed)
Pt states melozicam is not wondering and she is wondering if she can have something else?  CB [phone]

## 2021-01-04 NOTE — Addendum Note (Signed)
Addended by: Hortencia Pilar on: 01/04/2021 12:00 PM   Modules accepted: Orders

## 2021-01-04 NOTE — Telephone Encounter (Signed)
I called and advised the patient of the new anti inflammatory to take in place of meloxicam, bid prn with food. Advised her to let us know if she has any problems or fails to improve.

## 2021-01-04 NOTE — Telephone Encounter (Signed)
Please advise 

## 2021-01-06 ENCOUNTER — Telehealth: Payer: Self-pay | Admitting: Family Medicine

## 2021-01-06 NOTE — Telephone Encounter (Signed)
I called and left voice mail for patient to call back regarding the xray.

## 2021-01-06 NOTE — Telephone Encounter (Signed)
Both knees have mild arthritis on x-ray.  Could consider trying to get gel injection approval if she'd like.

## 2021-01-09 ENCOUNTER — Telehealth: Payer: Self-pay

## 2021-01-09 NOTE — Telephone Encounter (Signed)
Pt called back and is awaiting for the call back

## 2021-01-09 NOTE — Telephone Encounter (Signed)
Pt called back stating that she is going shopping and is tired of waiting on a call back and she would like the information regarding her X-rays to be sent to [facility]. And Lattie Haw is who she has been seeing.

## 2021-01-09 NOTE — Telephone Encounter (Signed)
Faxed xray reports, as requested [phone]). Will still try calling the patient again, though. See other message on this.

## 2021-01-09 NOTE — Telephone Encounter (Signed)
I called and advised the patient of her xray results and possible option for trying for gel injection approval. She has PT at [facility] this Wednesday (I faxed results to the patient's therapist, Lattie Haw). The patient will let us know if she would like to [place] to pursue injection approval.

## 2021-01-10 DIAGNOSIS — G479 Sleep disorder, unspecified: Secondary | ICD-10-CM | POA: Diagnosis not present

## 2021-01-10 DIAGNOSIS — Z0000 Encounter for general adult medical examination without abnormal findings: Secondary | ICD-10-CM | POA: Diagnosis not present

## 2021-01-10 DIAGNOSIS — Z8041 Family history of malignant neoplasm of ovary: Secondary | ICD-10-CM | POA: Diagnosis not present

## 2021-01-10 DIAGNOSIS — R2689 Other abnormalities of gait and mobility: Secondary | ICD-10-CM | POA: Diagnosis not present

## 2021-01-10 DIAGNOSIS — R69 Illness, unspecified: Secondary | ICD-10-CM | POA: Diagnosis not present

## 2021-01-10 DIAGNOSIS — M85851 Other specified disorders of bone density and structure, right thigh: Secondary | ICD-10-CM | POA: Diagnosis not present

## 2021-01-10 DIAGNOSIS — Z23 Encounter for immunization: Secondary | ICD-10-CM | POA: Diagnosis not present

## 2021-01-10 DIAGNOSIS — K5901 Slow transit constipation: Secondary | ICD-10-CM | POA: Diagnosis not present

## 2021-01-11 DIAGNOSIS — M25561 Pain in right knee: Secondary | ICD-10-CM | POA: Diagnosis not present

## 2021-01-11 DIAGNOSIS — M25562 Pain in left knee: Secondary | ICD-10-CM | POA: Diagnosis not present

## 2021-01-11 DIAGNOSIS — R262 Difficulty in walking, not elsewhere classified: Secondary | ICD-10-CM | POA: Diagnosis not present

## 2021-01-13 DIAGNOSIS — W57XXXA Bitten or stung by nonvenomous insect and other nonvenomous arthropods, initial encounter: Secondary | ICD-10-CM | POA: Diagnosis not present

## 2021-01-13 DIAGNOSIS — R140 Abdominal distension (gaseous): Secondary | ICD-10-CM | POA: Diagnosis not present

## 2021-01-13 DIAGNOSIS — S40261A Insect bite (nonvenomous) of right shoulder, initial encounter: Secondary | ICD-10-CM | POA: Diagnosis not present

## 2021-01-16 ENCOUNTER — Other Ambulatory Visit: Payer: Self-pay | Admitting: Family Medicine

## 2021-01-16 DIAGNOSIS — M8580 Other specified disorders of bone density and structure, unspecified site: Secondary | ICD-10-CM

## 2021-01-17 ENCOUNTER — Other Ambulatory Visit: Payer: Self-pay | Admitting: Family Medicine

## 2021-01-17 DIAGNOSIS — Z1231 Encounter for screening mammogram for malignant neoplasm of breast: Secondary | ICD-10-CM

## 2021-01-19 DIAGNOSIS — R52 Pain, unspecified: Secondary | ICD-10-CM | POA: Diagnosis not present

## 2021-01-19 DIAGNOSIS — S40261A Insect bite (nonvenomous) of right shoulder, initial encounter: Secondary | ICD-10-CM | POA: Diagnosis not present

## 2021-01-19 DIAGNOSIS — R109 Unspecified abdominal pain: Secondary | ICD-10-CM | POA: Diagnosis not present

## 2021-01-19 DIAGNOSIS — W57XXXA Bitten or stung by nonvenomous insect and other nonvenomous arthropods, initial encounter: Secondary | ICD-10-CM | POA: Diagnosis not present

## 2021-01-19 DIAGNOSIS — R197 Diarrhea, unspecified: Secondary | ICD-10-CM | POA: Diagnosis not present

## 2021-01-19 DIAGNOSIS — Z6822 Body mass index (BMI) 22.0-22.9, adult: Secondary | ICD-10-CM | POA: Diagnosis not present

## 2021-01-19 DIAGNOSIS — R509 Fever, unspecified: Secondary | ICD-10-CM | POA: Diagnosis not present

## 2021-02-17 DIAGNOSIS — R262 Difficulty in walking, not elsewhere classified: Secondary | ICD-10-CM | POA: Diagnosis not present

## 2021-02-17 DIAGNOSIS — M25562 Pain in left knee: Secondary | ICD-10-CM | POA: Diagnosis not present

## 2021-02-17 DIAGNOSIS — M25561 Pain in right knee: Secondary | ICD-10-CM | POA: Diagnosis not present

## 2021-02-22 DIAGNOSIS — M25562 Pain in left knee: Secondary | ICD-10-CM | POA: Diagnosis not present

## 2021-02-22 DIAGNOSIS — R262 Difficulty in walking, not elsewhere classified: Secondary | ICD-10-CM | POA: Diagnosis not present

## 2021-02-22 DIAGNOSIS — M25561 Pain in right knee: Secondary | ICD-10-CM | POA: Diagnosis not present

## 2021-02-24 DIAGNOSIS — M25561 Pain in right knee: Secondary | ICD-10-CM | POA: Diagnosis not present

## 2021-02-24 DIAGNOSIS — R262 Difficulty in walking, not elsewhere classified: Secondary | ICD-10-CM | POA: Diagnosis not present

## 2021-02-24 DIAGNOSIS — M25562 Pain in left knee: Secondary | ICD-10-CM | POA: Diagnosis not present

## 2021-03-01 DIAGNOSIS — R262 Difficulty in walking, not elsewhere classified: Secondary | ICD-10-CM | POA: Diagnosis not present

## 2021-03-01 DIAGNOSIS — M25561 Pain in right knee: Secondary | ICD-10-CM | POA: Diagnosis not present

## 2021-03-01 DIAGNOSIS — M25562 Pain in left knee: Secondary | ICD-10-CM | POA: Diagnosis not present

## 2021-03-07 DIAGNOSIS — D2272 Melanocytic nevi of left lower limb, including hip: Secondary | ICD-10-CM | POA: Diagnosis not present

## 2021-03-07 DIAGNOSIS — B351 Tinea unguium: Secondary | ICD-10-CM | POA: Diagnosis not present

## 2021-03-07 DIAGNOSIS — L243 Irritant contact dermatitis due to cosmetics: Secondary | ICD-10-CM | POA: Diagnosis not present

## 2021-03-07 DIAGNOSIS — D1801 Hemangioma of skin and subcutaneous tissue: Secondary | ICD-10-CM | POA: Diagnosis not present

## 2021-03-07 DIAGNOSIS — Z85828 Personal history of other malignant neoplasm of skin: Secondary | ICD-10-CM | POA: Diagnosis not present

## 2021-03-07 DIAGNOSIS — L603 Nail dystrophy: Secondary | ICD-10-CM | POA: Diagnosis not present

## 2021-03-07 DIAGNOSIS — L821 Other seborrheic keratosis: Secondary | ICD-10-CM | POA: Diagnosis not present

## 2021-03-08 DIAGNOSIS — M25562 Pain in left knee: Secondary | ICD-10-CM | POA: Diagnosis not present

## 2021-03-08 DIAGNOSIS — R262 Difficulty in walking, not elsewhere classified: Secondary | ICD-10-CM | POA: Diagnosis not present

## 2021-03-08 DIAGNOSIS — M25561 Pain in right knee: Secondary | ICD-10-CM | POA: Diagnosis not present

## 2021-03-10 ENCOUNTER — Ambulatory Visit
Admission: RE | Admit: 2021-03-10 | Discharge: 2021-03-10 | Disposition: A | Discharge status: Home / Self Care | Payer: Medicare HMO | Source: Ambulatory Visit | Attending: Family Medicine | Admitting: Family Medicine

## 2021-03-10 ENCOUNTER — Other Ambulatory Visit: Payer: Self-pay

## 2021-03-10 DIAGNOSIS — Z1231 Encounter for screening mammogram for malignant neoplasm of breast: Secondary | ICD-10-CM | POA: Diagnosis not present

## 2021-03-14 DIAGNOSIS — M25561 Pain in right knee: Secondary | ICD-10-CM | POA: Diagnosis not present

## 2021-03-14 DIAGNOSIS — R262 Difficulty in walking, not elsewhere classified: Secondary | ICD-10-CM | POA: Diagnosis not present

## 2021-03-14 DIAGNOSIS — M25562 Pain in left knee: Secondary | ICD-10-CM | POA: Diagnosis not present

## 2021-03-16 DIAGNOSIS — M25561 Pain in right knee: Secondary | ICD-10-CM | POA: Diagnosis not present

## 2021-03-16 DIAGNOSIS — M25562 Pain in left knee: Secondary | ICD-10-CM | POA: Diagnosis not present

## 2021-03-16 DIAGNOSIS — R262 Difficulty in walking, not elsewhere classified: Secondary | ICD-10-CM | POA: Diagnosis not present

## 2021-03-17 ENCOUNTER — Other Ambulatory Visit: Payer: Self-pay

## 2021-03-17 ENCOUNTER — Ambulatory Visit
Admission: RE | Admit: 2021-03-17 | Discharge: 2021-03-17 | Disposition: A | Discharge status: Home / Self Care | Payer: Medicare HMO | Source: Ambulatory Visit | Attending: Family Medicine | Admitting: Family Medicine

## 2021-03-17 DIAGNOSIS — Z780 Asymptomatic menopausal state: Secondary | ICD-10-CM | POA: Diagnosis not present

## 2021-03-17 DIAGNOSIS — M8589 Other specified disorders of bone density and structure, multiple sites: Secondary | ICD-10-CM | POA: Diagnosis not present

## 2021-03-17 DIAGNOSIS — M8580 Other specified disorders of bone density and structure, unspecified site: Secondary | ICD-10-CM

## 2021-03-20 DIAGNOSIS — H26491 Other secondary cataract, right eye: Secondary | ICD-10-CM | POA: Diagnosis not present

## 2021-03-20 DIAGNOSIS — H04123 Dry eye syndrome of bilateral lacrimal glands: Secondary | ICD-10-CM | POA: Diagnosis not present

## 2021-03-20 DIAGNOSIS — H5010 Unspecified exotropia: Secondary | ICD-10-CM | POA: Diagnosis not present

## 2021-03-20 DIAGNOSIS — H524 Presbyopia: Secondary | ICD-10-CM | POA: Diagnosis not present

## 2021-03-22 DIAGNOSIS — Z0100 Encounter for examination of eyes and vision without abnormal findings: Secondary | ICD-10-CM | POA: Diagnosis not present

## 2021-03-23 DIAGNOSIS — R262 Difficulty in walking, not elsewhere classified: Secondary | ICD-10-CM | POA: Diagnosis not present

## 2021-03-23 DIAGNOSIS — M25561 Pain in right knee: Secondary | ICD-10-CM | POA: Diagnosis not present

## 2021-03-23 DIAGNOSIS — M25562 Pain in left knee: Secondary | ICD-10-CM | POA: Diagnosis not present

## 2021-03-30 DIAGNOSIS — M25562 Pain in left knee: Secondary | ICD-10-CM | POA: Diagnosis not present

## 2021-03-30 DIAGNOSIS — M25561 Pain in right knee: Secondary | ICD-10-CM | POA: Diagnosis not present

## 2021-03-30 DIAGNOSIS — R262 Difficulty in walking, not elsewhere classified: Secondary | ICD-10-CM | POA: Diagnosis not present

## 2021-04-06 DIAGNOSIS — M25561 Pain in right knee: Secondary | ICD-10-CM | POA: Diagnosis not present

## 2021-04-06 DIAGNOSIS — R262 Difficulty in walking, not elsewhere classified: Secondary | ICD-10-CM | POA: Diagnosis not present

## 2021-04-06 DIAGNOSIS — M25562 Pain in left knee: Secondary | ICD-10-CM | POA: Diagnosis not present

## 2021-04-13 DIAGNOSIS — M25561 Pain in right knee: Secondary | ICD-10-CM | POA: Diagnosis not present

## 2021-04-13 DIAGNOSIS — M25562 Pain in left knee: Secondary | ICD-10-CM | POA: Diagnosis not present

## 2021-04-13 DIAGNOSIS — R262 Difficulty in walking, not elsewhere classified: Secondary | ICD-10-CM | POA: Diagnosis not present

## 2021-05-17 DIAGNOSIS — G479 Sleep disorder, unspecified: Secondary | ICD-10-CM | POA: Diagnosis not present

## 2021-05-18 ENCOUNTER — Ambulatory Visit: Payer: Medicare HMO | Admitting: Physician Assistant

## 2021-07-14 DIAGNOSIS — G479 Sleep disorder, unspecified: Secondary | ICD-10-CM | POA: Diagnosis not present

## 2021-07-14 DIAGNOSIS — M85851 Other specified disorders of bone density and structure, right thigh: Secondary | ICD-10-CM | POA: Diagnosis not present

## 2021-07-14 DIAGNOSIS — M170 Bilateral primary osteoarthritis of knee: Secondary | ICD-10-CM | POA: Diagnosis not present

## 2021-07-14 DIAGNOSIS — Z79899 Other long term (current) drug therapy: Secondary | ICD-10-CM | POA: Diagnosis not present

## 2021-07-14 DIAGNOSIS — M21961 Unspecified acquired deformity of right lower leg: Secondary | ICD-10-CM | POA: Diagnosis not present

## 2021-07-14 DIAGNOSIS — Z6823 Body mass index (BMI) 23.0-23.9, adult: Secondary | ICD-10-CM | POA: Diagnosis not present

## 2021-07-14 DIAGNOSIS — K5901 Slow transit constipation: Secondary | ICD-10-CM | POA: Diagnosis not present

## 2021-07-14 DIAGNOSIS — R69 Illness, unspecified: Secondary | ICD-10-CM | POA: Diagnosis not present

## 2021-07-17 DIAGNOSIS — R300 Dysuria: Secondary | ICD-10-CM | POA: Diagnosis not present

## 2021-07-17 DIAGNOSIS — R350 Frequency of micturition: Secondary | ICD-10-CM | POA: Diagnosis not present

## 2021-08-02 DIAGNOSIS — R197 Diarrhea, unspecified: Secondary | ICD-10-CM | POA: Diagnosis not present

## 2021-08-02 DIAGNOSIS — L400 Psoriasis vulgaris: Secondary | ICD-10-CM | POA: Diagnosis not present

## 2021-08-02 DIAGNOSIS — Z85828 Personal history of other malignant neoplasm of skin: Secondary | ICD-10-CM | POA: Diagnosis not present

## 2021-08-03 DIAGNOSIS — H40013 Open angle with borderline findings, low risk, bilateral: Secondary | ICD-10-CM | POA: Diagnosis not present

## 2021-08-03 DIAGNOSIS — H35032 Hypertensive retinopathy, left eye: Secondary | ICD-10-CM | POA: Diagnosis not present

## 2021-08-03 DIAGNOSIS — R197 Diarrhea, unspecified: Secondary | ICD-10-CM | POA: Diagnosis not present

## 2021-08-03 DIAGNOSIS — H5010 Unspecified exotropia: Secondary | ICD-10-CM | POA: Diagnosis not present

## 2021-08-03 DIAGNOSIS — H26491 Other secondary cataract, right eye: Secondary | ICD-10-CM | POA: Diagnosis not present

## 2021-08-09 ENCOUNTER — Encounter: Payer: Self-pay | Admitting: Physical Therapy

## 2021-08-09 ENCOUNTER — Other Ambulatory Visit: Payer: Self-pay

## 2021-08-09 ENCOUNTER — Ambulatory Visit: Payer: Medicare HMO | Attending: Family Medicine | Admitting: Physical Therapy

## 2021-08-09 DIAGNOSIS — R2681 Unsteadiness on feet: Secondary | ICD-10-CM

## 2021-08-09 DIAGNOSIS — M6281 Muscle weakness (generalized): Secondary | ICD-10-CM | POA: Diagnosis not present

## 2021-08-09 DIAGNOSIS — R2689 Other abnormalities of gait and mobility: Secondary | ICD-10-CM | POA: Diagnosis not present

## 2021-08-09 DIAGNOSIS — R293 Abnormal posture: Secondary | ICD-10-CM

## 2021-08-09 NOTE — Therapy (Signed)
[facility] [address], [place], [place], [address] Phone: [phone]   Fax:  [phone]  Physical Therapy Evaluation  Patient Details  Name: Christina Patton MRN: [id number] Date of Birth: 1946/03/16 Referring Provider (PT): Theadore Nan, MD   Encounter Date: 08/09/2021   PT End of Session - 08/09/21 1545     Visit Number 1    Number of Visits 17    Date for PT Re-Evaluation 10/04/21    Authorization Type [employer]na Medicare    PT Start Time 9357   pt late   PT Stop Time 1447    PT Time Calculation (min) 35 min    Equipment Utilized During Treatment Gait belt    Activity Tolerance Patient tolerated treatment well    Behavior During Therapy [facility] for tasks assessed/performed             Past Medical History:  Diagnosis Date   Anxiety    Cancer ([place])    skin cancer on face   Osteoporosis     History reviewed. No pertinent surgical history.  There were no vitals filed for this visit.    Subjective Assessment - 08/09/21 1413     Subjective Patient reports that she lost her dog a year ago. Used to walk her dog a mile a day but since this happened, she hasnt been walking as much. Enjoys gardening but has trouble getting up after stooping down d/t weakness. Owns walking poles but has not used them yet. Denies falls in the past 6 months. Reports hx of B knee pain.    Pertinent History anxiety, osteoporosis    Limitations Lifting;Standing;Walking;House hold activities    Diagnostic tests none recent    Patient Stated Goals improve balance and strength    Currently in Pain? Other (Comment)   pain not included in reason for referral               [facility] PT Assessment - 08/09/21 1416       Assessment   Medical Diagnosis Balance problem    Referring Provider (PT) Theadore Nan, MD    Onset Date/Surgical Date 08/09/20    Next MD Visit 08/18/21    Prior Therapy yes      Precautions   Precautions Fall   osteoporosis      Balance Screen   Has the patient fallen in the past 6 months No    Has the patient had a decrease in activity level because of a fear of falling?  No    Is the patient reluctant to leave their home because of a fear of falling?  No      Home Environment   Living Environment Private residence    Living Arrangements Spouse/significant other    Available Help at Discharge Family    Type of [place] to enter    Entrance Stairs-Number of Steps 2+15    Entrance Stairs-Rails [place] Grab bars - toilet;Grab bars - tub/shower   walking poles     Prior Function   Level of Independence Independent    Vocation Retired    Leisure gardening, walking on the trail on her property ~1/2 mile      Cognition   Overall Cognitive Status Within Functional Limits for tasks assessed      Sensation   Light Touch Appears Intact  Posture/Postural Control   Posture/Postural Control Postural limitations    Postural Limitations Rounded Shoulders;Forward head;Increased thoracic kyphosis      ROM / Strength   AROM / PROM / Strength AROM;Strength      AROM   AROM Assessment Site Ankle    Right/Left Ankle Right;Left    Right Ankle Dorsiflexion 21    Left Ankle Dorsiflexion 16      Strength   Overall Strength Comments in sitting    Strength Assessment Site Hip;Knee;Ankle    Right/Left Hip Right;Left    Right Hip Flexion 4+/5    Right Hip ABduction 4+/5    Right Hip ADduction 4+/5    Left Hip Flexion 4/5    Left Hip ABduction 4-/5    Left Hip ADduction 4/5    Right/Left Knee Right;Left    Right Knee Flexion 4/5    Right Knee Extension 4/5    Left Knee Flexion 4+/5    Left Knee Extension 4-/5    Right/Left Ankle Right;Left    Right Ankle Dorsiflexion 4/5    Right Ankle Plantar Flexion 5/5   20 reps against gravity   Left Ankle Dorsiflexion 4+/5    Left Ankle Plantar Flexion 5/5   20 reps against gravity      Ambulation/Gait   Assistive device None    Gait Pattern Step-to pattern;Step-through pattern;Decreased step length - right;Decreased step length - left;Poor foot clearance - left;Poor foot clearance - right;Right flexed knee in stance;Left flexed knee in stance   trunk leaning anteriorly with heavy weight shift over toes   Ambulation Surface Level;Indoor    Gait velocity decreased      Standardized Balance Assessment   Standardized Balance Assessment Timed Up and Go Test;Five Times Sit to Stand;Dynamic Gait Index    Five times sit to stand comments  20.85 sec without UEs   initial difficulty standing up     Dynamic Gait Index   Level Surface Mild Impairment    Change in Gait Speed Moderate Impairment    Gait with Horizontal Head Turns Moderate Impairment    Gait with Vertical Head Turns Moderate Impairment    Gait and Pivot Turn Mild Impairment    Step Over Obstacle Mild Impairment    Step Around Obstacles Severe Impairment    Steps Normal    Total Score 12      Timed Up and Go Test   Normal TUG (seconds) 11.31   without AD                       Objective measurements completed on examination: See above findings.                PT Education - 08/09/21 1545     Education Details prognosis, POC, HEP    Person(s) Educated Patient    Methods Explanation;Demonstration;Tactile cues;Verbal cues;Handout    Comprehension Verbalized understanding;Returned demonstration              PT Short Term Goals - 08/09/21 1553       PT SHORT TERM GOAL #1   Title Patient to be independent with initial HEP.    Time 3    Period Weeks    Status New    Target Date 08/30/21               PT Long Term Goals - 08/09/21 1553       PT LONG TERM GOAL #1   Title Patient to  be independent with advanced HEP.    Time 8    Period Weeks    Status New    Target Date 10/04/21      PT LONG TERM GOAL #2   Title Patient to demonstrate B LE strength >/=4+/5.     Time 8    Period Weeks    Status New    Target Date 10/04/21      PT LONG TERM GOAL #3   Title Patient to demonstrate 5xSTS test in <18 sec in order to decrease risk of falls.    Time 8    Period Weeks    Status New    Target Date 10/04/21      PT LONG TERM GOAL #4   Title Patient to score at least 18/24 on DGI in order to decrease risk of falls.    Time 8    Period Weeks    Status New    Target Date 10/04/21      PT LONG TERM GOAL #5   Title Patient to demonstrate normalized step length with knee extension at heel strike and stance phase of gait with LRAD.    Time 8    Period Weeks    Status New    Target Date 10/04/21      Additional Long Term Goals   Additional Long Term Goals Yes      PT LONG TERM GOAL #6   Title Patient to report tolerance for 1/2 mile walk with LRAD for safety.    Time 8    Period Weeks    Status New    Target Date 10/04/21      PT LONG TERM GOAL #7   Title Patient to perform tall kneeling <> stand with mod I and good stability for improved safety with gardening.    Time 8    Period Weeks    Status New    Target Date 10/04/21                    Plan - 08/09/21 1546     Clinical Impression Statement Patient is a 76 y/o F presenting to OPPT with c/o imbalance and difficulty walking for the past year. Denies recent falls, however admits to decreased activity levels since her dog passed away. Reports that she would like to work on balance and strengthening in order to improve ability to get up from kneeling while gardening and walking on the trail on her property. Patient today presenting with rounded shoulders and kyphotic posture, L>R LE weakness, gait deviations, and imbalance. Patients scored on 5xSTS and DGI indicate an increased risk of falls. Patient was educated on gentle LE strengthening HEP and reported understanding. Would benefit from skilled PT services 2x/week for 8 weeks to address aforementioned impairments in order to  optimize level of function.    Personal Factors and Comorbidities Age;Fitness;Comorbidity 2;Time since onset of injury/illness/exacerbation;Past/Current Experience    Comorbidities anxiety, osteoporosis    Examination-Activity Limitations Bathing;Locomotion Level;Bend;Carry;Squat;Dressing;Stairs;Hygiene/Grooming;Stand;Lift    Examination-Participation Restrictions Yard Work;Shop;Laundry;Community Activity;Cleaning;Church;Meal Prep    Stability/Clinical Decision Making Evolving/Moderate complexity    Clinical Decision Making Moderate    Rehab Potential Good    PT Frequency 2x / week    PT Duration 8 weeks    PT Treatment/Interventions ADLs/Self Care Home Management;Canalith Repostioning;Cryotherapy;Electrical Stimulation;DME Instruction;Moist Heat;Gait training;Stair training;Functional mobility training;Therapeutic activities;Therapeutic exercise;Balance training;Neuromuscular re-education;Manual techniques;Patient/family education;Passive range of motion;Dry needling;Energy conservation;Vestibular;Taping    PT Next Visit Plan reassess HEP; progress L>R LE  strengthening, STS transfer technique, gait training with walking poles, balance    Consulted and Agree with Plan of Care Patient             Patient will benefit from skilled therapeutic intervention in order to improve the following deficits and impairments:  Abnormal gait, Difficulty walking, Decreased activity tolerance, Decreased balance, Decreased knowledge of use of DME, Impaired flexibility, Improper body mechanics, Postural dysfunction, Decreased strength  Visit Diagnosis: Unsteadiness on feet  Other abnormalities of gait and mobility  Muscle weakness (generalized)  Abnormal posture     Problem List Patient Active Problem List   Diagnosis Date Noted   Hemoptysis 09/12/2016    Janene Harvey, PT, DPT 08/09/21 3:58 PM   [facility] [address] [place], [place], [address] Phone: [phone]   Fax:  [phone]  Name: Christina Patton MRN: [id number] Date of Birth: 1946/02/10

## 2021-08-15 ENCOUNTER — Ambulatory Visit: Payer: Medicare HMO | Admitting: Physical Therapy

## 2021-08-15 ENCOUNTER — Other Ambulatory Visit: Payer: Self-pay

## 2021-08-15 ENCOUNTER — Encounter: Payer: Self-pay | Admitting: Physical Therapy

## 2021-08-15 DIAGNOSIS — M6281 Muscle weakness (generalized): Secondary | ICD-10-CM

## 2021-08-15 DIAGNOSIS — R2689 Other abnormalities of gait and mobility: Secondary | ICD-10-CM | POA: Diagnosis not present

## 2021-08-15 DIAGNOSIS — R2681 Unsteadiness on feet: Secondary | ICD-10-CM

## 2021-08-15 DIAGNOSIS — R293 Abnormal posture: Secondary | ICD-10-CM | POA: Diagnosis not present

## 2021-08-15 NOTE — Therapy (Signed)
[facility] [address] [place], [place], [address] Phone: [phone]   Fax:  [phone]  Physical Therapy Treatment  Patient Details  Name: Christina Patton MRN: [id number] Date of Birth: Jan 22, 1946 Referring Provider (PT): Theadore Nan, MD   Encounter Date: 08/15/2021   PT End of Session - 08/15/21 1406     Visit Number 2    Number of Visits 17    Date for PT Re-Evaluation 10/04/21    Authorization Type Aetna Medicare    PT Start Time 8242    PT Stop Time 1356    PT Time Calculation (min) 39 min    Activity Tolerance Patient tolerated treatment well    Behavior During Therapy [facility] for tasks assessed/performed             Past Medical History:  Diagnosis Date   Anxiety    Cancer ([place])    skin cancer on face   Osteoporosis     History reviewed. No pertinent surgical history.  There were no vitals filed for this visit.   Subjective Assessment - 08/15/21 1320     Subjective Husband is in the hospital. Has been dealing with that as well as phone calls from family/friends. Reports compliance with HEP and has a question on one of them.    Pertinent History anxiety, osteoporosis    Diagnostic tests none recent    Patient Stated Goals improve balance and strength    Currently in Pain? No/denies                               [facility] Adult PT Treatment/Exercise - 08/15/21 0001       Exercises   Exercises Knee/Hip      Knee/Hip Exercises: Aerobic   Nustep L5 x 6 min (UEs/LEs)      Knee/Hip Exercises: Standing   Hip Abduction Stengthening;Right;Left;1 set;10 reps;Knee straight    Abduction Limitations cues to maintain upright posture    Functional Squat 1 set;10 reps    Functional Squat Limitations cues for form    Other Standing Knee Exercises sidestepping yellow loop 4x   cueing to maintain normal step length     Knee/Hip Exercises: Seated   Long Arc Quad Strengthening;Right;Left;1 set;10  reps    Long Arc Quad Limitations yellow loop   cues for TKE   Sit to [employer] 1 set;10 reps;without UE support   cues to scoot forward and improve eccentric lower                Balance Exercises - 08/15/21 1334       Balance Exercises: Standing   Standing Eyes Opened Narrow base of support (BOS);30 secs;Solid surface;Limitations;Foam/compliant surface;2 reps    Standing Eyes Opened Limitations more instability on foam    Standing Eyes Closed Narrow base of support (BOS);Solid surface;30 secs;Limitations;Wide (BOA);Foam/compliant surface;2 reps    Standing Eyes Closed Limitations more instability on foam, required wide BOS with EC d/t imbalance    Other Standing Exercises Comments feet close (not romberg) on foam + head turns/nods in II bars   required reaching strategy and CGA               PT Education - 08/15/21 1405     Education Details update and review of HEP-Access Code [email or web address]    Person(s) Educated Patient    Methods Explanation;Demonstration;Tactile cues;Verbal cues;Handout    Comprehension Verbalized understanding;Returned  demonstration              PT Short Term Goals - 08/15/21 1448       PT SHORT TERM GOAL #1   Title Patient to be independent with initial HEP.    Time 3    Period Weeks    Status Achieved    Target Date 08/30/21               PT Long Term Goals - 08/15/21 1448       PT LONG TERM GOAL #1   Title Patient to be independent with advanced HEP.    Time 8    Period Weeks    Status On-going    Target Date 10/04/21      PT LONG TERM GOAL #2   Title Patient to demonstrate B LE strength >/=4+/5.    Time 8    Period Weeks    Status On-going    Target Date 10/04/21      PT LONG TERM GOAL #3   Title Patient to demonstrate 5xSTS test in <18 sec in order to decrease risk of falls.    Time 8    Period Weeks    Status On-going    Target Date 10/04/21      PT LONG TERM GOAL #4   Title Patient to score at least 18/24 on DGI  in order to decrease risk of falls.    Time 8    Period Weeks    Status On-going    Target Date 10/04/21      PT LONG TERM GOAL #5   Title Patient to demonstrate normalized step length with knee extension at heel strike and stance phase of gait with LRAD.    Time 8    Period Weeks    Status On-going    Target Date 10/04/21      PT LONG TERM GOAL #6   Title Patient to report tolerance for 1/2 mile walk with LRAD for safety.    Time 8    Period Weeks    Status On-going    Target Date 10/04/21      PT LONG TERM GOAL #7   Title Patient to perform tall kneeling <> stand with mod I and good stability for improved safety with gardening.    Time 8    Period Weeks    Status On-going    Target Date 10/04/21                   Plan - 08/15/21 1406     Clinical Impression Statement Patient arrived to session with report of HEP compliance, however notes having some questions on her program. Reviewed HEP and answered patients questions. STS transfers required cues to scoot forward and improve eccentric lower. Patient demonstrated good static balance on firm surface, more difficulty on foam surface and with removal of vision. Patient with slightly relayed reaction speed, requiring CGA-min A for correction of balance with more challenging activities. Able to maintain balance on foam with wider BOS, thus updated this into HEP with edu on max safety. Patient reported understanding and without complaints at end of session.    Personal Factors and Comorbidities Age;Fitness;Comorbidity 2;Time since onset of injury/illness/exacerbation;Past/Current Experience    Comorbidities anxiety, osteoporosis    Examination-Activity Limitations Bathing;Locomotion Level;Bend;Carry;Squat;Dressing;Stairs;Hygiene/Grooming;Stand;Lift    Examination-Participation Restrictions Yard Work;Shop;Laundry;Community Activity;Cleaning;Church;Meal Prep    Stability/Clinical Decision Making Evolving/Moderate complexity     Rehab Potential Good    PT Frequency  2x / week    PT Duration 8 weeks    PT Treatment/Interventions ADLs/Self Care Home Management;Canalith Repostioning;Cryotherapy;Electrical Stimulation;DME Instruction;Moist Heat;Gait training;Stair training;Functional mobility training;Therapeutic activities;Therapeutic exercise;Balance training;Neuromuscular re-education;Manual techniques;Patient/family education;Passive range of motion;Dry needling;Energy conservation;Vestibular;Taping    PT Next Visit Plan progress L>R LE strengthening, STS transfer technique, gait training with walking poles, balance    Consulted and Agree with Plan of Care Patient             Patient will benefit from skilled therapeutic intervention in order to improve the following deficits and impairments:  Abnormal gait, Difficulty walking, Decreased activity tolerance, Decreased balance, Decreased knowledge of use of DME, Impaired flexibility, Improper body mechanics, Postural dysfunction, Decreased strength  Visit Diagnosis: Unsteadiness on feet  Other abnormalities of gait and mobility  Muscle weakness (generalized)  Abnormal posture     Problem List Patient Active Problem List   Diagnosis Date Noted   Hemoptysis 09/12/2016    Janene Harvey, PT, DPT 08/15/21 2:49 PM   [facility] [address], [place], [place], [address] Phone: [phone]   Fax:  [phone]  Name: TEDDY PENA MRN: [id number] Date of Birth: 02-Feb-1946

## 2021-08-17 ENCOUNTER — Ambulatory Visit: Payer: Medicare HMO | Admitting: Physical Therapy

## 2021-08-22 ENCOUNTER — Other Ambulatory Visit: Payer: Self-pay

## 2021-08-22 ENCOUNTER — Ambulatory Visit: Payer: Medicare HMO | Admitting: Physical Therapy

## 2021-08-22 ENCOUNTER — Encounter: Payer: Self-pay | Admitting: Physical Therapy

## 2021-08-22 DIAGNOSIS — R2681 Unsteadiness on feet: Secondary | ICD-10-CM | POA: Diagnosis not present

## 2021-08-22 DIAGNOSIS — R2689 Other abnormalities of gait and mobility: Secondary | ICD-10-CM

## 2021-08-22 DIAGNOSIS — R293 Abnormal posture: Secondary | ICD-10-CM | POA: Diagnosis not present

## 2021-08-22 DIAGNOSIS — M6281 Muscle weakness (generalized): Secondary | ICD-10-CM | POA: Diagnosis not present

## 2021-08-22 NOTE — Therapy (Signed)
[facility] [address], [place], [place], [address] Phone: [phone]   Fax:  [phone]  Physical Therapy Treatment  Patient Details  Name: Christina Patton MRN: [id number] Date of Birth: [birth year] Referring Provider (PT): Theadore Nan, MD   Encounter Date: 08/22/2021   PT End of Session - 08/22/21 0852     Visit Number 3    Number of Visits 17    Date for PT Re-Evaluation 10/04/21    Authorization Type [employer] Medicare    PT Start Time [phone]    PT Stop Time 0930    PT Time Calculation (min) 38 min    Activity Tolerance Patient tolerated treatment well    Behavior During Therapy [facility] for tasks assessed/performed             Past Medical History:  Diagnosis Date   Anxiety    Cancer ([place])    skin cancer on face   Osteoporosis     History reviewed. No pertinent surgical history.  There were no vitals filed for this visit.   Subjective Assessment - 08/22/21 0854     Subjective Husband is home, but he is "an invalid."  I have a lot going on right now.  My legs hurt a lot during the winter, the drying times with the dew point.  Husband doesn't need a lot of physical assistance.    Pertinent History anxiety, osteoporosis    Diagnostic tests none recent    Patient Stated Goals improve balance and strength    Currently in Pain? Yes    Pain Score 6     Pain Location Knee    Pain Orientation Right;Left    Pain Descriptors / Indicators Aching    Pain Type Chronic pain    Pain Frequency Intermittent    Aggravating Factors  cold, dry air    Pain Relieving Factors local cream rubbing on it.                   Reviewed exercises as part of HEP from last visit.  Pt return demo understanding.  She doesn't remember doing the toe-taps, but demonstrates understanding with review today. Sit to Stand Without Arm Support - 1 x daily - 5 x weekly - 2 sets - 10 reps Standing Hip Abduction with Counter Support - 1 x daily -  5 x weekly - 2 sets - 10 reps-cues for foot position pointing forward to decrease hip external rotation Wide Stance with Eyes Closed on Foam Pad - 1 x daily - 5 x weekly - 2 sets - 30 sec hold Standing Toe Taps - 1 x daily - 5 x weekly - 2 sets - 10 reps            [facility] Adult PT Treatment/Exercise - 08/22/21 0001       Knee/Hip Exercises: Aerobic   Nustep L4 x 6 min (BUEs/BLEs)                 Balance Exercises - 08/22/21 0001       Balance Exercises: Standing   Standing Eyes Opened Narrow base of support (BOS);Limitations;Foam/compliant surface    Standing Eyes Opened Limitations Head turns/nods x 5    Standing Eyes Closed Narrow base of support (BOS);30 secs;Limitations;Wide (BOA);Foam/compliant surface;2 reps    Standing Eyes Closed Limitations more instability with narrower BOS; requires light UE support    Rockerboard Anterior/posterior;Lateral;EO;Limitations    Rockerboard Limitations Ankle/hip strategy work ant/post  and lateral directions with UE support    Retro Gait Upper extremity support;4 reps;Limitations    Retro Gait Limitations Forward/back walking in parallel bars; cues for heelstrike, equal, even step length    Heel Raises Both;10 reps;Limitations    Heel Raises Limitations On Airex    Toe Raise 10 reps;Both;Limitations    Toe Raise Limitations On airex    Other Standing Exercises Comments On Airex, wide BOS lateral weightshifting x 10 reps                  PT Short Term Goals - 08/15/21 1448       PT SHORT TERM GOAL #1   Title Patient to be independent with initial HEP.    Time 3    Period Weeks    Status Achieved    Target Date 08/30/21               PT Long Term Goals - 08/15/21 1448       PT LONG TERM GOAL #1   Title Patient to be independent with advanced HEP.    Time 8    Period Weeks    Status On-going    Target Date 10/04/21      PT LONG TERM GOAL #2   Title Patient to demonstrate B LE strength >/=4+/5.     Time 8    Period Weeks    Status On-going    Target Date 10/04/21      PT LONG TERM GOAL #3   Title Patient to demonstrate 5xSTS test in <18 sec in order to decrease risk of falls.    Time 8    Period Weeks    Status On-going    Target Date 10/04/21      PT LONG TERM GOAL #4   Title Patient to score at least 18/24 on DGI in order to decrease risk of falls.    Time 8    Period Weeks    Status On-going    Target Date 10/04/21      PT LONG TERM GOAL #5   Title Patient to demonstrate normalized step length with knee extension at heel strike and stance phase of gait with LRAD.    Time 8    Period Weeks    Status On-going    Target Date 10/04/21      PT LONG TERM GOAL #6   Title Patient to report tolerance for 1/2 mile walk with LRAD for safety.    Time 8    Period Weeks    Status On-going    Target Date 10/04/21      PT LONG TERM GOAL #7   Title Patient to perform tall kneeling <> stand with mod I and good stability for improved safety with gardening.    Time 8    Period Weeks    Status On-going    Target Date 10/04/21                   Plan - 08/22/21 0933     Clinical Impression Statement Pt missed one session last week due to husband's hospitalization.  She arrives today and reports no falls, but having some pain in knees due to dry, cold weather.  She reports doing several of the exercises in her HEP.  Continued to work on compliant surface balance activities today with light UE support.  Narrowed BOS>Romberg stance is more difficult on compliant surfaces, with increased lateral sway noted.  She will  continue to benefit from from skilled physical therapy to improve balance and gait towards goals.    Personal Factors and Comorbidities Age;Fitness;Comorbidity 2;Time since onset of injury/illness/exacerbation;Past/Current Experience    Comorbidities anxiety, osteoporosis    Examination-Activity Limitations Bathing;Locomotion  Level;Bend;Carry;Squat;Dressing;Stairs;Hygiene/Grooming;Stand;Lift    Examination-Participation Restrictions Yard Work;Shop;Laundry;Community Activity;Cleaning;Church;Meal Prep    Stability/Clinical Decision Making Evolving/Moderate complexity    Rehab Potential Good    PT Frequency 2x / week    PT Duration 8 weeks    PT Treatment/Interventions ADLs/Self Care Home Management;Canalith Repostioning;Cryotherapy;Electrical Stimulation;DME Instruction;Moist Heat;Gait training;Stair training;Functional mobility training;Therapeutic activities;Therapeutic exercise;Balance training;Neuromuscular re-education;Manual techniques;Patient/family education;Passive range of motion;Dry needling;Energy conservation;Vestibular;Taping    PT Next Visit Plan progress L>R LE strengthening, STS transfer technique, gait training with walking poles, balance    Consulted and Agree with Plan of Care Patient             Patient will benefit from skilled therapeutic intervention in order to improve the following deficits and impairments:  Abnormal gait, Difficulty walking, Decreased activity tolerance, Decreased balance, Decreased knowledge of use of DME, Impaired flexibility, Improper body mechanics, Postural dysfunction, Decreased strength  Visit Diagnosis: Unsteadiness on feet  Other abnormalities of gait and mobility  Muscle weakness (generalized)     Problem List Patient Active Problem List   Diagnosis Date Noted   Hemoptysis 09/12/2016    Frazier Butt., PT 08/22/2021, 9:44 AM  [facility] [address], [place], [place], [address] Phone: [phone]   Fax:  [phone]  Name: Christina Patton MRN: [id number] Date of Birth: 03-03-1946

## 2021-08-24 ENCOUNTER — Ambulatory Visit: Payer: Medicare HMO | Admitting: Physical Therapy

## 2021-08-24 ENCOUNTER — Other Ambulatory Visit: Payer: Self-pay

## 2021-08-24 ENCOUNTER — Encounter: Payer: Self-pay | Admitting: Physical Therapy

## 2021-08-24 DIAGNOSIS — M6281 Muscle weakness (generalized): Secondary | ICD-10-CM

## 2021-08-24 DIAGNOSIS — R2681 Unsteadiness on feet: Secondary | ICD-10-CM | POA: Diagnosis not present

## 2021-08-24 DIAGNOSIS — R293 Abnormal posture: Secondary | ICD-10-CM | POA: Diagnosis not present

## 2021-08-24 DIAGNOSIS — R2689 Other abnormalities of gait and mobility: Secondary | ICD-10-CM

## 2021-08-24 NOTE — Therapy (Signed)
[facility] [address] [place], [place], [address] Phone: [phone]   Fax:  [phone]  Physical Therapy Treatment  Patient Details  Name: Christina Patton MRN: [id number] Date of Birth: [birth year] Referring Provider (PT): Theadore Nan, MD   Encounter Date: 08/24/2021   PT End of Session - 08/24/21 0933     Visit Number 4    Number of Visits 17    Date for PT Re-Evaluation 10/04/21    Authorization Type [employer] Medicare    PT Start Time [phone]    PT Stop Time 0928    PT Time Calculation (min) 42 min    Equipment Utilized During Treatment Gait belt    Activity Tolerance Patient tolerated treatment well    Behavior During Therapy [facility] for tasks assessed/performed             Past Medical History:  Diagnosis Date   Anxiety    Cancer ([place])    skin cancer on face   Osteoporosis     History reviewed. No pertinent surgical history.  There were no vitals filed for this visit.   Subjective Assessment - 08/24/21 0846     Subjective THings have been crazy at home with her husband being sick. Knees are sore.    Pertinent History anxiety, osteoporosis    Diagnostic tests none recent    Patient Stated Goals improve balance and strength    Currently in Pain? Yes    Pain Score 7     Pain Location Knee    Pain Orientation Right;Left    Pain Descriptors / Indicators Constant;Sharp    Pain Type Chronic pain                               [facility] Adult PT Treatment/Exercise - 08/24/21 0001       Ambulation/Gait   Ambulation Distance (Feet) --   95+72ft   Gait Pattern Step-to pattern;Step-through pattern;Decreased step length - right;Decreased step length - left;Poor foot clearance - left;Poor foot clearance - right;Right flexed knee in stance;Left flexed knee in stance    Ambulation Surface Level;Indoor    Gait velocity decreased    Gait Comments gait training with single and B walking poles with manual  and verbal cues for proper sequencing and encouraging step through pattern      Knee/Hip Exercises: Aerobic   Nustep L4 x 6 min (BUEs/BLEs); cues to maintain >/=70 SPM                 Balance Exercises - 08/24/21 0001       Balance Exercises: Standing   Standing Eyes Opened Narrow base of support (BOS);Foam/compliant surface;2 reps;30 secs    Standing Eyes Opened Limitations mild sway    Standing Eyes Closed Wide (BOA);Foam/compliant surface;2 reps;30 secs;Limitations    Standing Eyes Closed Limitations 1 episode of LOB requiring min A to recover; more steady on 2nd set    SLS with Vectors Limitations    SLS with Vectors Limitations alt toe tap on cone x20, double tap x20   cues for foot placement and to decrease spee   Other Standing Exercises romberg on foam head turns/nods 30" each   severe sway and delay in balance correction, especially posteriorly   Other Standing Exercises Comments step up/back on airex R/L without UE support   cueing for foot placement and lifting toes/knees to improve foot cleaance d/t catching  toes frequently               PT Education - 08/24/21 0932     Education Details advised to practice ambulating with her walking pole inside the house for max safety    Person(s) Educated Patient    Methods Explanation;Demonstration;Tactile cues;Verbal cues    Comprehension Verbalized understanding;Returned demonstration              PT Short Term Goals - 08/15/21 1448       PT SHORT TERM GOAL #1   Title Patient to be independent with initial HEP.    Time 3    Period Weeks    Status Achieved    Target Date 08/30/21               PT Long Term Goals - 08/15/21 1448       PT LONG TERM GOAL #1   Title Patient to be independent with advanced HEP.    Time 8    Period Weeks    Status On-going    Target Date 10/04/21      PT LONG TERM GOAL #2   Title Patient to demonstrate B LE strength >/=4+/5.    Time 8    Period Weeks    Status  On-going    Target Date 10/04/21      PT LONG TERM GOAL #3   Title Patient to demonstrate 5xSTS test in <18 sec in order to decrease risk of falls.    Time 8    Period Weeks    Status On-going    Target Date 10/04/21      PT LONG TERM GOAL #4   Title Patient to score at least 18/24 on DGI in order to decrease risk of falls.    Time 8    Period Weeks    Status On-going    Target Date 10/04/21      PT LONG TERM GOAL #5   Title Patient to demonstrate normalized step length with knee extension at heel strike and stance phase of gait with LRAD.    Time 8    Period Weeks    Status On-going    Target Date 10/04/21      PT LONG TERM GOAL #6   Title Patient to report tolerance for 1/2 mile walk with LRAD for safety.    Time 8    Period Weeks    Status On-going    Target Date 10/04/21      PT LONG TERM GOAL #7   Title Patient to perform tall kneeling <> stand with mod I and good stability for improved safety with gardening.    Time 8    Period Weeks    Status On-going    Target Date 10/04/21                   Plan - 08/24/21 0933     Clinical Impression Statement Patient arrived to session with report of knee soreness. Reports that she has been doing more walking and driving since her husbands hospital admission. Initiated gait training with single and B walking poles with manual and verbal cues for proper sequencing and encouraging step through pattern. Patient with significant trouble with sequencing 2 poles, improved with single pole on the R. Static balance training revealed intermittent bouts of severe sway posteriorly and delay in balance correction. Dynamic balance activities focused on stepping strategy and improving foot clearance. Patient tolerated session well and reported pain  no worse at end of session.    Personal Factors and Comorbidities Age;Fitness;Comorbidity 2;Time since onset of injury/illness/exacerbation;Past/Current Experience    Comorbidities  anxiety, osteoporosis    Examination-Activity Limitations Bathing;Locomotion Level;Bend;Carry;Squat;Dressing;Stairs;Hygiene/Grooming;Stand;Lift    Examination-Participation Restrictions Yard Work;Shop;Laundry;Community Activity;Cleaning;Church;Meal Prep    Stability/Clinical Decision Making Evolving/Moderate complexity    Rehab Potential Good    PT Frequency 2x / week    PT Duration 8 weeks    PT Treatment/Interventions ADLs/Self Care Home Management;Canalith Repostioning;Cryotherapy;Electrical Stimulation;DME Instruction;Moist Heat;Gait training;Stair training;Functional mobility training;Therapeutic activities;Therapeutic exercise;Balance training;Neuromuscular re-education;Manual techniques;Patient/family education;Passive range of motion;Dry needling;Energy conservation;Vestibular;Taping    PT Next Visit Plan progress L>R LE strengthening, STS transfer technique, gait training with walking poles, balance    Consulted and Agree with Plan of Care Patient             Patient will benefit from skilled therapeutic intervention in order to improve the following deficits and impairments:  Abnormal gait, Difficulty walking, Decreased activity tolerance, Decreased balance, Decreased knowledge of use of DME, Impaired flexibility, Improper body mechanics, Postural dysfunction, Decreased strength  Visit Diagnosis: Unsteadiness on feet  Other abnormalities of gait and mobility  Muscle weakness (generalized)  Abnormal posture     Problem List Patient Active Problem List   Diagnosis Date Noted   Hemoptysis 09/12/2016    Janene Harvey, PT, DPT 08/24/21 9:37 AM   [facility] [address], [place], [place], [address] Phone: [phone]   Fax:  [phone]  Name: Christina Patton MRN: [id number] Date of Birth: 12-Dec-1945

## 2021-08-29 ENCOUNTER — Other Ambulatory Visit: Payer: Self-pay

## 2021-08-29 ENCOUNTER — Encounter: Payer: Self-pay | Admitting: Physical Therapy

## 2021-08-29 ENCOUNTER — Ambulatory Visit: Payer: Medicare HMO | Admitting: Physical Therapy

## 2021-08-29 DIAGNOSIS — R2681 Unsteadiness on feet: Secondary | ICD-10-CM

## 2021-08-29 DIAGNOSIS — R293 Abnormal posture: Secondary | ICD-10-CM | POA: Diagnosis not present

## 2021-08-29 DIAGNOSIS — R2689 Other abnormalities of gait and mobility: Secondary | ICD-10-CM

## 2021-08-29 DIAGNOSIS — M6281 Muscle weakness (generalized): Secondary | ICD-10-CM | POA: Diagnosis not present

## 2021-08-29 IMAGING — CR DG KNEE 1-2V*R*
2 series · 2 of 2 positions shown · non-contrast
Comparison: None.

CLINICAL DATA: Increasing knee pain

EXAM:
RIGHT KNEE - 1-2 VIEW

[w knee ap right]
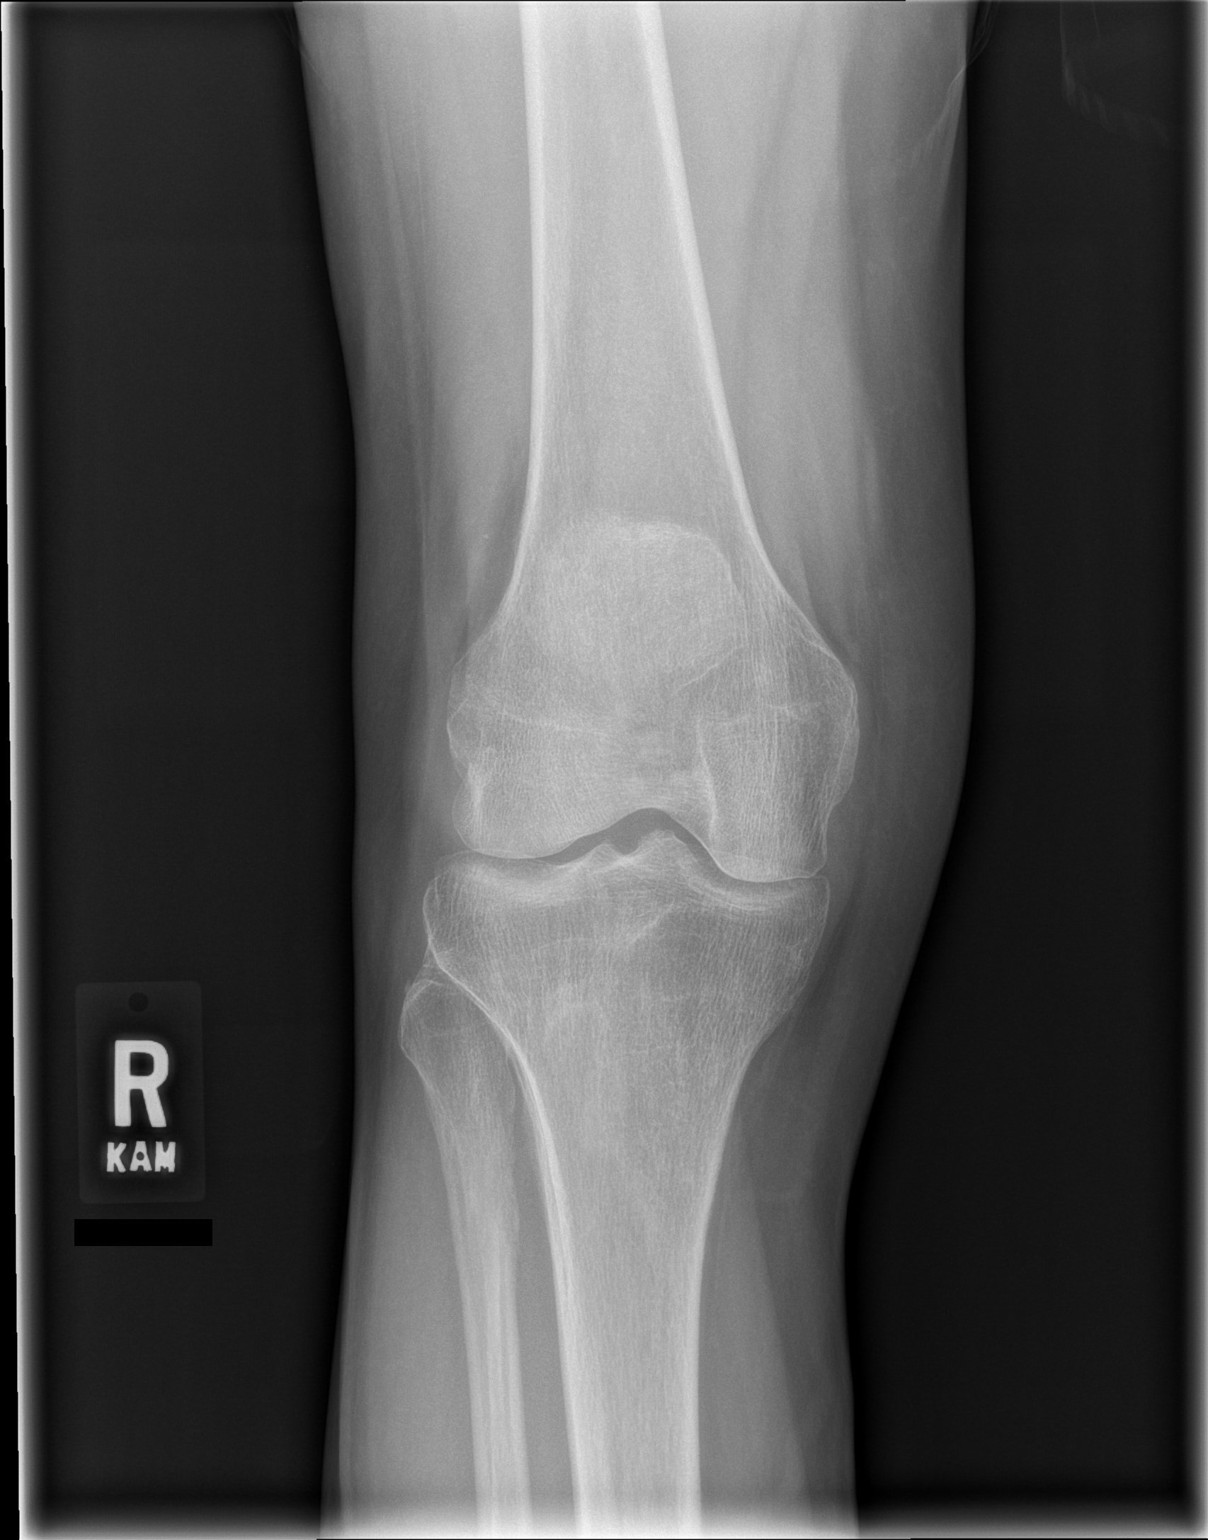

[w knee lat right]
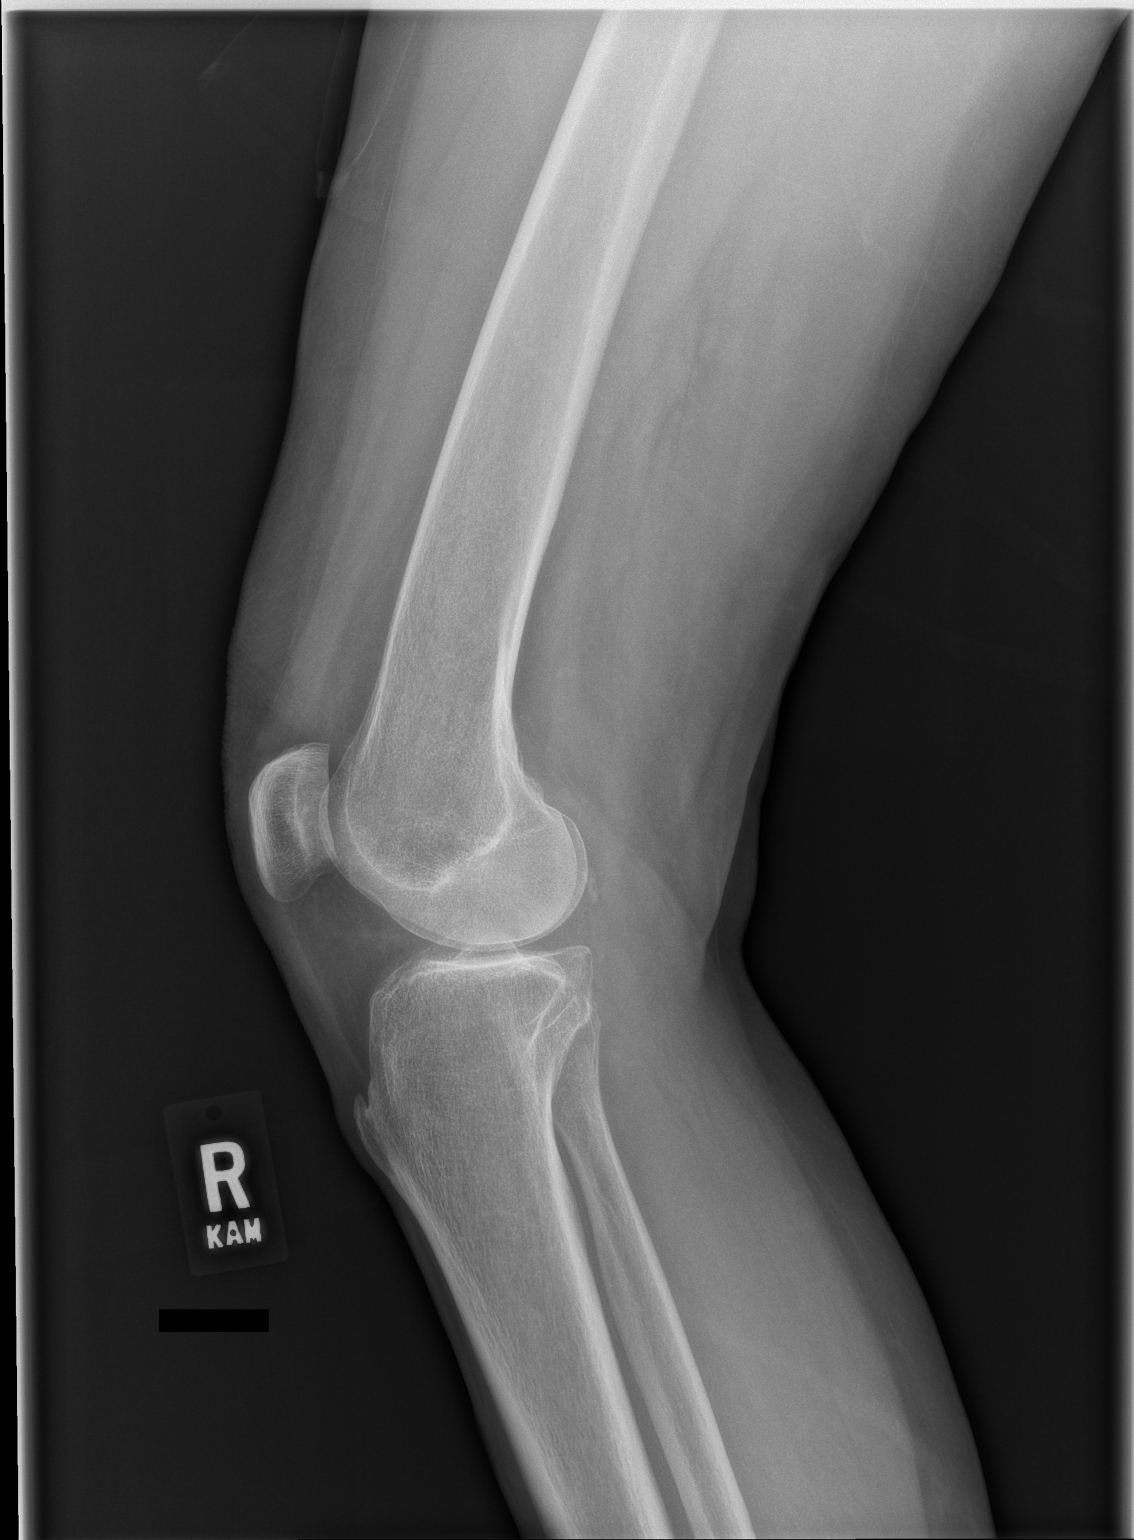

[2 of 2 positions shown; findings below may reference images not displayed]

FINDINGS: No fracture or malalignment. Minimal medial joint space degenerative
change. Trace effusion
IMPRESSION: Minimal degenerative change with trace effusion

## 2021-08-29 NOTE — Therapy (Addendum)
[facility] [address], [place], [place], [address] Phone: [phone]   Fax:  [phone]  Physical Therapy Treatment  Patient Details  Name: Christina Patton MRN: [id number] Date of Birth: 12-13-1945 Referring Provider (PT): Theadore Nan, MD   Encounter Date: 08/29/2021   PT End of Session - 08/29/21 1019     Visit Number 5    Number of Visits 17    Date for PT Re-Evaluation 10/04/21    Authorization Type [employer]na Medicare    PT Start Time 1019    PT Stop Time 1058    PT Time Calculation (min) 39 min    Equipment Utilized During Treatment Gait belt    Activity Tolerance Patient tolerated treatment well    Behavior During Therapy [facility] for tasks assessed/performed             Past Medical History:  Diagnosis Date   Anxiety    Cancer ([place])    skin cancer on face   Osteoporosis     History reviewed. No pertinent surgical history.  There were no vitals filed for this visit.   Subjective Assessment - 08/29/21 1019     Subjective No falls,  Knees are very painful today.    Pertinent History anxiety, osteoporosis    Diagnostic tests none recent    Patient Stated Goals improve balance and strength    Currently in Pain? Yes    Pain Score 5     Pain Location Knee    Pain Orientation Right;Left    Pain Descriptors / Indicators Sharp    Pain Type Chronic pain    Pain Onset More than a month ago    Pain Frequency Intermittent    Aggravating Factors  coldness; first thing in the morning    Pain Relieving Factors rubbing cream on it; as I'm up and moving                               [place] Adult PT Treatment/Exercise - 08/29/21 0001       Ambulation/Gait   Ambulation/Gait Yes    Ambulation/Gait Assistance 5: Supervision    Ambulation Distance (Feet) 85 Feet   x 4, then 85 ft x 2   Assistive device Other (Comment)   Single walking pole   Gait Pattern Step-to pattern;Step-through pattern;Decreased  step length - right;Decreased step length - left;Poor foot clearance - left;Poor foot clearance - right;Right flexed knee in stance;Left flexed knee in stance    Ambulation Surface Level;Indoor    Pre-Gait Activities Discussed pt needing the rubber tip on the bottom of her walking pole to assist with steadiness of pole indoors.  Discussed and PT demonstrated gait safety with negotiating down-hill slopes, as pt has to walk this to get to mailbox.    Gait Comments gait training with single walking pole with manual and verbal cues for proper sequencing and encouraging step through pattern.  Cues for heelstrike and slowed pace for improved step through pattern.   Pt gets off sequence and needs resetting with cues and assistance throughout.                 Balance Exercises - 08/29/21 0001       Balance Exercises: Standing   SLS with Vectors Solid surface;Upper extremity assist 2;Limitations    SLS with Vectors Limitations Alt step taps to 4" step, BUE support, 15 reps  Tandem Gait Forward;Upper extremity support;4 reps    Retro Gait Upper extremity support;Limitations;5 reps    Retro Gait Limitations Forward/back walking in parallel bars; cues for heelstrike, equal, even step length    Heel Raises Both;10 reps;Limitations    Heel Raises Limitations 2 sets    Toe Raise 10 reps;Both;Limitations    Toe Raise Limitations 2 sets    Other Standing Exercises Forward walk along counter, cues to count steps to take larger steps (went from 12 steps to 6 steps)    Other Standing Exercises Comments Stagger stance forward/back rocking, 15 reps,                PT Education - 08/29/21 1104     Education Details Continued practice with walking pole; needing to use rubber tip for walking pole    Person(s) Educated Patient    Methods Explanation;Demonstration    Comprehension Verbalized understanding;Returned demonstration              PT Short Term Goals - 08/15/21 1448       PT  SHORT TERM GOAL #1   Title Patient to be independent with initial HEP.    Time 3    Period Weeks    Status Achieved    Target Date 08/30/21               PT Long Term Goals - 08/15/21 1448       PT LONG TERM GOAL #1   Title Patient to be independent with advanced HEP.    Time 8    Period Weeks    Status On-going    Target Date 10/04/21      PT LONG TERM GOAL #2   Title Patient to demonstrate B LE strength >/=4+/5.    Time 8    Period Weeks    Status On-going    Target Date 10/04/21      PT LONG TERM GOAL #3   Title Patient to demonstrate 5xSTS test in <18 sec in order to decrease risk of falls.    Time 8    Period Weeks    Status On-going    Target Date 10/04/21      PT LONG TERM GOAL #4   Title Patient to score at least 18/24 on DGI in order to decrease risk of falls.    Time 8    Period Weeks    Status On-going    Target Date 10/04/21      PT LONG TERM GOAL #5   Title Patient to demonstrate normalized step length with knee extension at heel strike and stance phase of gait with LRAD.    Time 8    Period Weeks    Status On-going    Target Date 10/04/21      PT LONG TERM GOAL #6   Title Patient to report tolerance for 1/2 mile walk with LRAD for safety.    Time 8    Period Weeks    Status On-going    Target Date 10/04/21      PT LONG TERM GOAL #7   Title Patient to perform tall kneeling <> stand with mod I and good stability for improved safety with gardening.    Time 8    Period Weeks    Status On-going    Target Date 10/04/21                 08/29/21 1100  Plan  Clinical Impression Statement Continued work  on bilateral walking pole sequence, with pt continueing to have difficulty with sequencing.  Cues for slowed pace and to stop occasionally to reset.  Pt will continue to benefit from skilled PT towards goals.  Personal Factors and Comorbidities Age;Fitness;Comorbidity 2;Time since onset of injury/illness/exacerbation;Past/Current  Experience  Comorbidities anxiety, osteoporosis  Examination-Activity Limitations Bathing;Locomotion Level;Bend;Carry;Squat;Dressing;Stairs;Hygiene/Grooming;Stand;Lift  Examination-Participation Restrictions Yard Work;Shop;Laundry;Community Activity;Cleaning;Church;Meal Prep  Pt will benefit from skilled therapeutic intervention in order to improve on the following deficits Abnormal gait;Difficulty walking;Decreased activity tolerance;Decreased balance;Decreased knowledge of use of DME;Impaired flexibility;Improper body mechanics;Postural dysfunction;Decreased strength  Stability/Clinical Decision Making Evolving/Moderate complexity  Rehab Potential Good  PT Frequency 2x / week  PT Duration 8 weeks  PT Treatment/Interventions ADLs/Self Care Home Management;Canalith Repostioning;Cryotherapy;Electrical Stimulation;DME Instruction;Moist Heat;Gait training;Stair training;Functional mobility training;Therapeutic activities;Therapeutic exercise;Balance training;Neuromuscular re-education;Manual techniques;Patient/family education;Passive range of motion;Dry needling;Energy conservation;Vestibular;Taping  PT Next Visit Plan progress L>R LE strengthening, STS transfer technique, gait training with walking poles, balance  Consulted and Agree with Plan of Care Patient       Patient will benefit from skilled therapeutic intervention in order to improve the following deficits and impairments:     Visit Diagnosis: Unsteadiness on feet  Other abnormalities of gait and mobility     Problem List Patient Active Problem List   Diagnosis Date Noted   Hemoptysis 09/12/2016    Frazier Butt., PT 08/29/2021, 11:58 AM  [facility] [address] [place], [place], [address] Phone: [phone]   Fax:  [phone]  Name: Christina Patton MRN: [id number] Date of Birth: 1946-02-11

## 2021-08-31 ENCOUNTER — Other Ambulatory Visit: Payer: Self-pay

## 2021-08-31 ENCOUNTER — Encounter: Payer: Self-pay | Admitting: Physical Therapy

## 2021-08-31 ENCOUNTER — Ambulatory Visit: Payer: Medicare HMO | Attending: Family Medicine | Admitting: Physical Therapy

## 2021-08-31 DIAGNOSIS — R293 Abnormal posture: Secondary | ICD-10-CM | POA: Diagnosis not present

## 2021-08-31 DIAGNOSIS — R2689 Other abnormalities of gait and mobility: Secondary | ICD-10-CM | POA: Diagnosis not present

## 2021-08-31 DIAGNOSIS — M6281 Muscle weakness (generalized): Secondary | ICD-10-CM | POA: Diagnosis not present

## 2021-08-31 DIAGNOSIS — R2681 Unsteadiness on feet: Secondary | ICD-10-CM | POA: Diagnosis not present

## 2021-08-31 NOTE — Therapy (Signed)
[facility] [address] [place], [place], [address] Phone: [phone]   Fax:  [phone]  Physical Therapy Treatment  Patient Details  Name: Christina Patton MRN: [id number] Date of Birth: 15-Sep-1945 Referring Provider (PT): Theadore Nan, MD   Encounter Date: 08/31/2021   PT End of Session - 08/31/21 1445     Visit Number 6    Number of Visits 17    Date for PT Re-Evaluation 10/04/21    Authorization Type [employer]    PT Start Time 1403    PT Stop Time 1444    PT Time Calculation (min) 41 min    Equipment Utilized During Treatment Gait belt    Activity Tolerance Patient tolerated treatment well    Behavior During Therapy [facility] for tasks assessed/performed             Past Medical History:  Diagnosis Date   Anxiety    Cancer ([place])    skin cancer on face   Osteoporosis     History reviewed. No pertinent surgical history.  There were no vitals filed for this visit.   Subjective Assessment - 08/31/21 1406     Subjective Husband is back home and doing better. I am doing pretty good. Knees are feeling much better today.    Pertinent History anxiety, osteoporosis    Diagnostic tests none recent    Patient Stated Goals improve balance and strength    Currently in Pain? No/denies                               [facility] Adult PT Treatment/Exercise - 08/31/21 0001       Ambulation/Gait   Ambulation/Gait Assistance 5: Supervision;4: Min guard    Ambulation Distance (Feet) 172 Feet    Assistive device --   single walking pole   Gait Pattern Step-to pattern;Step-through pattern;Decreased step length - right;Decreased step length - left;Poor foot clearance - left;Poor foot clearance - right;Right flexed knee in stance;Left flexed knee in stance    Ambulation Surface Level;Indoor    Gait Comments gait training with single walking pole with manual and verbal cues for proper sequencing and encouraging  step through pattern.  Cues to pick up L foot and increase R step length to encourage step through pattern      Neuro Re-ed    Neuro Re-ed Details  static rockerboard balance an/tpos and R/L balance and wt shifts with light B UE support on II bars and CGA; R/L fwd/back stepping over 1/2 foam with heel touch x15 each with variable UE support; multidirectional 3 cone tap without UE support; R/L weight shifts      Knee/Hip Exercises: Aerobic   Nustep L5 x 6 min (BUEs/BLEs); cues to maintain >/=70 SPM                 Balance Exercises - 08/31/21 0001       Balance Exercises: Standing   Standing Eyes Closed Narrow base of support (BOS);Solid surface;2 reps;30 secs   head turns/nods                 PT Short Term Goals - 08/15/21 1448       PT SHORT TERM GOAL #1   Title Patient to be independent with initial HEP.    Time 3    Period Weeks    Status Achieved    Target Date 08/30/21  PT Long Term Goals - 08/15/21 1448       PT LONG TERM GOAL #1   Title Patient to be independent with advanced HEP.    Time 8    Period Weeks    Status On-going    Target Date 10/04/21      PT LONG TERM GOAL #2   Title Patient to demonstrate B LE strength >/=4+/5.    Time 8    Period Weeks    Status On-going    Target Date 10/04/21      PT LONG TERM GOAL #3   Title Patient to demonstrate 5xSTS test in <18 sec in order to decrease risk of falls.    Time 8    Period Weeks    Status On-going    Target Date 10/04/21      PT LONG TERM GOAL #4   Title Patient to score at least 18/24 on DGI in order to decrease risk of falls.    Time 8    Period Weeks    Status On-going    Target Date 10/04/21      PT LONG TERM GOAL #5   Title Patient to demonstrate normalized step length with knee extension at heel strike and stance phase of gait with LRAD.    Time 8    Period Weeks    Status On-going    Target Date 10/04/21      PT LONG TERM GOAL #6   Title Patient to  report tolerance for 1/2 mile walk with LRAD for safety.    Time 8    Period Weeks    Status On-going    Target Date 10/04/21      PT LONG TERM GOAL #7   Title Patient to perform tall kneeling <> stand with mod I and good stability for improved safety with gardening.    Time 8    Period Weeks    Status On-going    Target Date 10/04/21                   Plan - 08/31/21 1445     Clinical Impression Statement Patient arrived to session with report of improved knee pain today. Ambulating into session with single walking pole with inconsistent sequencing. Practiced gait training with this device, provided cues to pick up L foot and increase R step length to encourage step through pattern. Patient with more difficulty recovering balance posteriorly on rockerboard today, requiring reaching strategy and verbal cueing to correct. Toe taps required manual and verbal cues to encourage full R/L weight shift onto supportive LE; patient with limited carryover of this. Patient with fairly good static balance with addition of head movements. Patient tolerated session well and without complaints upon leaving.    Personal Factors and Comorbidities Age;Fitness;Comorbidity 2;Time since onset of injury/illness/exacerbation;Past/Current Experience    Comorbidities anxiety, osteoporosis    Examination-Activity Limitations Bathing;Locomotion Level;Bend;Carry;Squat;Dressing;Stairs;Hygiene/Grooming;Stand;Lift    Examination-Participation Restrictions Yard Work;Shop;Laundry;Community Activity;Cleaning;Church;Meal Prep    Stability/Clinical Decision Making Evolving/Moderate complexity    Rehab Potential Good    PT Frequency 2x / week    PT Duration 8 weeks    PT Treatment/Interventions ADLs/Self Care Home Management;Canalith Repostioning;Cryotherapy;Electrical Stimulation;DME Instruction;Moist Heat;Gait training;Stair training;Functional mobility training;Therapeutic activities;Therapeutic exercise;Balance  training;Neuromuscular re-education;Manual techniques;Patient/family education;Passive range of motion;Dry needling;Energy conservation;Vestibular;Taping    PT Next Visit Plan progress L>R LE strengthening, STS transfer technique, gait training with walking poles, balance    Consulted and Agree with Plan of Care Patient  Patient will benefit from skilled therapeutic intervention in order to improve the following deficits and impairments:  Abnormal gait, Difficulty walking, Decreased activity tolerance, Decreased balance, Decreased knowledge of use of DME, Impaired flexibility, Improper body mechanics, Postural dysfunction, Decreased strength  Visit Diagnosis: Unsteadiness on feet  Other abnormalities of gait and mobility  Muscle weakness (generalized)  Abnormal posture     Problem List Patient Active Problem List   Diagnosis Date Noted   Hemoptysis 09/12/2016    Janene Harvey, PT, DPT 08/31/21 5:07 PM   [facility] [address] [place], [place], [address] Phone: [phone]   Fax:  [phone]  Name: ELEAH LAHAIE MRN: [id number] Date of Birth: 12-03-1945

## 2021-08-31 NOTE — Therapy (Signed)
[facility] [address], [place], [place], [address] Phone: [phone]   Fax:  [phone]  Physical Therapy Treatment  Patient Details  Name: Christina Patton MRN: [id number] Date of Birth: November 13, 1945 Referring Provider (PT): Theadore Nan, MD   Encounter Date: 08/31/2021   PT End of Session - 08/31/21 1445     Visit Number 6    Number of Visits 17    Date for PT Re-Evaluation 10/04/21    Authorization Type [employer] Medicare    PT Start Time 1403    PT Stop Time 1444    PT Time Calculation (min) 41 min    Equipment Utilized During Treatment Gait belt    Activity Tolerance Patient tolerated treatment well    Behavior During Therapy [facility] for tasks assessed/performed             Past Medical History:  Diagnosis Date   Anxiety    Cancer ([place])    skin cancer on face   Osteoporosis     History reviewed. No pertinent surgical history.  There were no vitals filed for this visit.   Subjective Assessment - 08/31/21 1406     Subjective Husband is back home and doing better. I am doing pretty good. Knees are feeling much better today.    Pertinent History anxiety, osteoporosis    Diagnostic tests none recent    Patient Stated Goals improve balance and strength    Currently in Pain? No/denies                               [facility] Adult PT Treatment/Exercise - 08/31/21 0001       Ambulation/Gait   Ambulation/Gait Assistance 5: Supervision;4: Min guard    Ambulation Distance (Feet) 172 Feet    Assistive device --   single walking pole   Gait Pattern Step-to pattern;Step-through pattern;Decreased step length - right;Decreased step length - left;Poor foot clearance - left;Poor foot clearance - right;Right flexed knee in stance;Left flexed knee in stance    Ambulation Surface Level;Indoor    Gait Comments gait training with single walking pole with manual and verbal cues for proper sequencing and encouraging  step through pattern.  Cues to pick up L foot and increase R step length to encourage step through pattern      Neuro Re-ed    Neuro Re-ed Details  static rockerboard balance an/tpos and R/L balance and wt shifts with light B UE support on II bars and CGA; R/L fwd/back stepping over 1/2 foam with heel touch x15 each with variable UE support; multidirectional 3 cone tap without UE support; R/L weight shifts      Knee/Hip Exercises: Aerobic   Nustep L5 x 6 min (BUEs/BLEs); cues to maintain >/=70 SPM                 Balance Exercises - 08/31/21 0001       Balance Exercises: Standing   Standing Eyes Closed Narrow base of support (BOS);Solid surface;2 reps;30 secs   head turns/nods                 PT Short Term Goals - 08/15/21 1448       PT SHORT TERM GOAL #1   Title Patient to be independent with initial HEP.    Time 3    Period Weeks    Status Achieved    Target Date 08/30/21  PT Long Term Goals - 08/15/21 1448       PT LONG TERM GOAL #1   Title Patient to be independent with advanced HEP.    Time 8    Period Weeks    Status On-going    Target Date 10/04/21      PT LONG TERM GOAL #2   Title Patient to demonstrate B LE strength >/=4+/5.    Time 8    Period Weeks    Status On-going    Target Date 10/04/21      PT LONG TERM GOAL #3   Title Patient to demonstrate 5xSTS test in <18 sec in order to decrease risk of falls.    Time 8    Period Weeks    Status On-going    Target Date 10/04/21      PT LONG TERM GOAL #4   Title Patient to score at least 18/24 on DGI in order to decrease risk of falls.    Time 8    Period Weeks    Status On-going    Target Date 10/04/21      PT LONG TERM GOAL #5   Title Patient to demonstrate normalized step length with knee extension at heel strike and stance phase of gait with LRAD.    Time 8    Period Weeks    Status On-going    Target Date 10/04/21      PT LONG TERM GOAL #6   Title Patient to  report tolerance for 1/2 mile walk with LRAD for safety.    Time 8    Period Weeks    Status On-going    Target Date 10/04/21      PT LONG TERM GOAL #7   Title Patient to perform tall kneeling <> stand with mod I and good stability for improved safety with gardening.    Time 8    Period Weeks    Status On-going    Target Date 10/04/21                   Plan - 08/31/21 1445     Clinical Impression Statement Patient arrived to session with report of improved knee pain today. Ambulating into session with single walking pole with inconsistent sequencing. Practiced gait training with this device, provided cues to pick up L foot and increase R step length to encourage step through pattern. Patient with more difficulty recovering balance posteriorly on rockerboard today, requiring reaching strategy and verbal cueing to correct. Toe taps required manual and verbal cues to encourage full R/L weight shift onto supportive LE; patient with limited carryover of this. Patient with fairly good static balance with addition of head movements. Patient tolerated session well and without complaints upon leaving.    Personal Factors and Comorbidities Age;Fitness;Comorbidity 2;Time since onset of injury/illness/exacerbation;Past/Current Experience    Comorbidities anxiety, osteoporosis    Examination-Activity Limitations Bathing;Locomotion Level;Bend;Carry;Squat;Dressing;Stairs;Hygiene/Grooming;Stand;Lift    Examination-Participation Restrictions Yard Work;Shop;Laundry;Community Activity;Cleaning;Church;Meal Prep    Stability/Clinical Decision Making Evolving/Moderate complexity    Rehab Potential Good    PT Frequency 2x / week    PT Duration 8 weeks    PT Treatment/Interventions ADLs/Self Care Home Management;Canalith Repostioning;Cryotherapy;Electrical Stimulation;DME Instruction;Moist Heat;Gait training;Stair training;Functional mobility training;Therapeutic activities;Therapeutic exercise;Balance  training;Neuromuscular re-education;Manual techniques;Patient/family education;Passive range of motion;Dry needling;Energy conservation;Vestibular;Taping    PT Next Visit Plan progress L>R LE strengthening, STS transfer technique, gait training with walking poles, balance    Consulted and Agree with Plan of Care Patient  Patient will benefit from skilled therapeutic intervention in order to improve the following deficits and impairments:  Abnormal gait, Difficulty walking, Decreased activity tolerance, Decreased balance, Decreased knowledge of use of DME, Impaired flexibility, Improper body mechanics, Postural dysfunction, Decreased strength  Visit Diagnosis: Unsteadiness on feet  Other abnormalities of gait and mobility  Muscle weakness (generalized)  Abnormal posture     Problem List Patient Active Problem List   Diagnosis Date Noted   Hemoptysis 09/12/2016    Manuela Neptune, PT 08/31/2021, 5:06 PM  [facility] [address] [place], [place], [address] Phone: [phone]   Fax:  [phone]  Name: Christina Patton MRN: [id number] Date of Birth: 07-Nov-1945

## 2021-09-05 ENCOUNTER — Other Ambulatory Visit: Payer: Self-pay

## 2021-09-05 ENCOUNTER — Ambulatory Visit: Payer: Medicare HMO | Admitting: Physical Therapy

## 2021-09-05 ENCOUNTER — Encounter: Payer: Self-pay | Admitting: Physical Therapy

## 2021-09-05 DIAGNOSIS — R2689 Other abnormalities of gait and mobility: Secondary | ICD-10-CM | POA: Diagnosis not present

## 2021-09-05 DIAGNOSIS — R293 Abnormal posture: Secondary | ICD-10-CM | POA: Diagnosis not present

## 2021-09-05 DIAGNOSIS — R2681 Unsteadiness on feet: Secondary | ICD-10-CM

## 2021-09-05 DIAGNOSIS — M6281 Muscle weakness (generalized): Secondary | ICD-10-CM | POA: Diagnosis not present

## 2021-09-05 NOTE — Therapy (Signed)
[facility] [address] [place], [place], [address] Phone: [phone]   Fax:  [phone]  Physical Therapy Treatment  Patient Details  Name: Christina Patton MRN: [id number] Date of Birth: 1946-02-23 Referring Provider (PT): Theadore Nan, MD   Encounter Date: 09/05/2021   PT End of Session - 09/05/21 1316     Visit Number 7    Number of Visits 17    Date for PT Re-Evaluation 10/04/21    Authorization Type [employer] Medicare    PT Start Time 1318    PT Stop Time 1359    PT Time Calculation (min) 41 min    Equipment Utilized During Treatment Gait belt    Activity Tolerance Patient tolerated treatment well    Behavior During Therapy [facility] for tasks assessed/performed             Past Medical History:  Diagnosis Date   Anxiety    Cancer ([place])    skin cancer on face   Osteoporosis     History reviewed. No pertinent surgical history.  There were no vitals filed for this visit.   Subjective Assessment - 09/05/21 1316     Subjective Things going well.  No stumbles, falls.  Feeling better balanced at home.    Pertinent History anxiety, osteoporosis    Diagnostic tests none recent    Patient Stated Goals improve balance and strength    Currently in Pain? No/denies                               [facility] Adult PT Treatment/Exercise - 09/05/21 0001       Ambulation/Gait   Ambulation/Gait Yes    Ambulation/Gait Assistance 5: Supervision;4: Min guard    Ambulation Distance (Feet) 40 Feet   x 6, then >500 ft outdoors   Assistive device None;Other (Comment)   single walking pole outdoor gait   Gait Pattern Step-to pattern;Step-through pattern;Decreased step length - right;Decreased step length - left;Poor foot clearance - left;Poor foot clearance - right;Right flexed knee in stance;Left flexed knee in stance    Ambulation Surface Level;Indoor;Unlevel;Outdoor    Gait Comments Outdoor gait training with single  walking pole, cues for heel strike and foot clearance.  PT provides cues to listen for foot scuffing and try to avoid this; pt responds well to this cue, but does need cues to stop and reset to lessen scuffing/shuffling pattern.      Knee/Hip Exercises: Aerobic   Nustep L5 x 7 min (BUEs/BLEs); cues to maintain >/=60 SPM      Knee/Hip Exercises: Standing   Forward Step Up Right;Left;1 set;10 reps;Hand Hold: 2;Step Height: 6"    Forward Step Up Limitations cues for full knee extension                 Balance Exercises - 09/05/21 0001       Balance Exercises: Standing   Stepping Strategy Anterior;Posterior;Lateral;Foam/compliant surface;UE support;10 reps   cues for increased step length, heelstrike in forward direction   Rockerboard Anterior/posterior;EO;Limitations    Rockerboard Limitations Ankle/hip strategy work ant/post and lateral directions with UE support    Retro Gait Upper extremity support;Limitations;5 reps    Retro Gait Limitations Forward/back walking in parallel bars; cues for heelstrike, equal, even step length    Marching Foam/compliant surface;Upper extremity assist 2;Static;10 reps  PT Education - 09/05/21 1455     Education Details Use of auditory cues to listen for (try to avoid) sound of scuffing with her feet, to help reinforce foot clearance/heelstrike    Person(s) Educated Patient    Methods Explanation;Demonstration;Verbal cues    Comprehension Verbalized understanding;Returned demonstration;Verbal cues required              PT Short Term Goals - 08/15/21 1448       PT SHORT TERM GOAL #1   Title Patient to be independent with initial HEP.    Time 3    Period Weeks    Status Achieved    Target Date 08/30/21               PT Long Term Goals - 08/15/21 1448       PT LONG TERM GOAL #1   Title Patient to be independent with advanced HEP.    Time 8    Period Weeks    Status On-going    Target Date 10/04/21       PT LONG TERM GOAL #2   Title Patient to demonstrate B LE strength >/=4+/5.    Time 8    Period Weeks    Status On-going    Target Date 10/04/21      PT LONG TERM GOAL #3   Title Patient to demonstrate 5xSTS test in <18 sec in order to decrease risk of falls.    Time 8    Period Weeks    Status On-going    Target Date 10/04/21      PT LONG TERM GOAL #4   Title Patient to score at least 18/24 on DGI in order to decrease risk of falls.    Time 8    Period Weeks    Status On-going    Target Date 10/04/21      PT LONG TERM GOAL #5   Title Patient to demonstrate normalized step length with knee extension at heel strike and stance phase of gait with LRAD.    Time 8    Period Weeks    Status On-going    Target Date 10/04/21      PT LONG TERM GOAL #6   Title Patient to report tolerance for 1/2 mile walk with LRAD for safety.    Time 8    Period Weeks    Status On-going    Target Date 10/04/21      PT LONG TERM GOAL #7   Title Patient to perform tall kneeling <> stand with mod I and good stability for improved safety with gardening.    Time 8    Period Weeks    Status On-going    Target Date 10/04/21                   Plan - 09/05/21 1457     Clinical Impression Statement Continued skilled PT session this visit working on strength, balance, gait training.  For compliant surface balance stepping strategy work, pt requires cues for increased step length for improved foot clearance.  With gait, she responds well to use of auditory cue (trying to NOT hear scuffing) with gait, but she continues to have difficulty sustaining larger step length, heelstrike and foot clearance after about 25 ft of gait.  She continues to benefit from skilled PT towards goals for imrpoved mobility and decreased falls.    Personal Factors and Comorbidities Age;Fitness;Comorbidity 2;Time since onset of injury/illness/exacerbation;Past/Current Experience  Comorbidities anxiety, osteoporosis     Examination-Activity Limitations Bathing;Locomotion Level;Bend;Carry;Squat;Dressing;Stairs;Hygiene/Grooming;Stand;Lift    Examination-Participation Restrictions Yard Work;Shop;Laundry;Community Activity;Cleaning;Church;Meal Prep    Stability/Clinical Decision Making Evolving/Moderate complexity    Rehab Potential Good    PT Frequency 2x / week    PT Duration 8 weeks    PT Treatment/Interventions ADLs/Self Care Home Management;Canalith Repostioning;Cryotherapy;Electrical Stimulation;DME Instruction;Moist Heat;Gait training;Stair training;Functional mobility training;Therapeutic activities;Therapeutic exercise;Balance training;Neuromuscular re-education;Manual techniques;Patient/family education;Passive range of motion;Dry needling;Energy conservation;Vestibular;Taping    PT Next Visit Plan Continue RLE and LLE stregnthening, STS transfer technique, gait training with walking poles, balance; may consider trial of treadmill for incline/decline as pt reports having at home; may consider adding to HEP at some point    Consulted and Agree with Plan of Care Patient             Patient will benefit from skilled therapeutic intervention in order to improve the following deficits and impairments:  Abnormal gait, Difficulty walking, Decreased activity tolerance, Decreased balance, Decreased knowledge of use of DME, Impaired flexibility, Improper body mechanics, Postural dysfunction, Decreased strength  Visit Diagnosis: Other abnormalities of gait and mobility  Unsteadiness on feet  Muscle weakness (generalized)     Problem List Patient Active Problem List   Diagnosis Date Noted   Hemoptysis 09/12/2016    Frazier Butt., PT 09/05/2021, 3:01 PM  [facility] [address] [place], [place], [address] Phone: [phone]   Fax:  [phone]  Name: Christina Patton MRN: [id number] Date of Birth: 24-Sep-1945

## 2021-09-07 ENCOUNTER — Other Ambulatory Visit: Payer: Self-pay

## 2021-09-07 ENCOUNTER — Ambulatory Visit: Payer: Medicare HMO | Admitting: Physical Therapy

## 2021-09-07 ENCOUNTER — Encounter: Payer: Self-pay | Admitting: Physical Therapy

## 2021-09-07 DIAGNOSIS — R2689 Other abnormalities of gait and mobility: Secondary | ICD-10-CM | POA: Diagnosis not present

## 2021-09-07 DIAGNOSIS — R293 Abnormal posture: Secondary | ICD-10-CM | POA: Diagnosis not present

## 2021-09-07 DIAGNOSIS — M6281 Muscle weakness (generalized): Secondary | ICD-10-CM | POA: Diagnosis not present

## 2021-09-07 DIAGNOSIS — R2681 Unsteadiness on feet: Secondary | ICD-10-CM | POA: Diagnosis not present

## 2021-09-07 NOTE — Therapy (Signed)
[facility] [address] [place], [place], [address] Phone: [phone]   Fax:  [phone]  Physical Therapy Treatment  Patient Details  Name: Christina Patton MRN: [id number] Date of Birth: 1946/01/10 Referring Provider (PT): Theadore Nan, MD   Encounter Date: 09/07/2021   PT End of Session - 09/07/21 1404     Visit Number 8    Number of Visits 17    Date for PT Re-Evaluation 10/04/21    Authorization Type [employer] Medicare    PT Start Time 8250    PT Stop Time 1446    PT Time Calculation (min) 44 min    Equipment Utilized During Treatment Gait belt    Activity Tolerance Patient tolerated treatment well    Behavior During Therapy [facility] for tasks assessed/performed             Past Medical History:  Diagnosis Date   Anxiety    Cancer ([place])    skin cancer on face   Osteoporosis     History reviewed. No pertinent surgical history.  There were no vitals filed for this visit.   Subjective Assessment - 09/07/21 1404     Subjective Things going well.  No changes.  No falls.    Pertinent History anxiety, osteoporosis    Diagnostic tests none recent    Patient Stated Goals improve balance and strength    Currently in Pain? No/denies                               [facility] Adult PT Treatment/Exercise - 09/07/21 0001       Transfers   Transfers Sit to Stand;Stand to Sit    Sit to Stand 5: Supervision;Without upper extremity assist;From bed;6: Modified independent (Device/Increase time)    Stand to Sit 6: Modified independent (Device/Increase time);Without upper extremity assist;To bed    Number of Reps 2 sets;10 reps   2nd set of 10 standing on AIrex     Ambulation/Gait   Ambulation/Gait Yes    Ambulation/Gait Assistance 5: Supervision;4: Min guard    Ambulation Distance (Feet) 100 Feet   x 2, outdoor surfaces   Assistive device None;Other (Comment)   single walking pole   Gait Pattern Step-to  pattern;Step-through pattern;Decreased step length - right;Decreased step length - left;Poor foot clearance - left;Poor foot clearance - right;Right flexed knee in stance;Left flexed knee in stance    Ambulation Surface Level;Indoor;Unlevel;Outdoor    Gait Comments Treadmill gait training 0.4-0.5 mph with BUE support; verbal and visual cues to kick blue therapy ball, to assist with decreased anterior translation with gait.  Pt has heavy reliance on BUEs with gait on treadmill; 1 min, then 2 minutes.           Short distance gait between activities in PT session-pt with decreased heelstrike with gait, requires supervision.      Balance Exercises - 09/07/21 0001       Balance Exercises: Standing   Standing Eyes Opened Wide (BOA);Narrow base of support (BOS);Foam/compliant surface;Limitations    Standing Eyes Opened Limitations 2 sets x 5 reps head turns/nods BUE>1 UE support    Standing Eyes Closed Narrow base of support (BOS);Foam/compliant surface;Limitations;3 reps;20 secs    Standing Eyes Closed Limitations 1 episode of LOB requiring min A to recover; more steady on 2nd set    SLS with Vectors Solid surface;Upper extremity assist 2;Limitations    SLS with  Vectors Limitations Alt step taps to 6" step, BUE>1 UE support,2 x 10 reps    Stepping Strategy Anterior;Posterior;Lateral;Foam/compliant surface;UE support;10 reps;Limitations    Stepping Strategy Limitations Used mirror as visual feedback to assist with increased step length    Step Ups Forward;6 inch;UE support 2                  PT Short Term Goals - 08/15/21 1448       PT SHORT TERM GOAL #1   Title Patient to be independent with initial HEP.    Time 3    Period Weeks    Status Achieved    Target Date 08/30/21               PT Long Term Goals - 08/15/21 1448       PT LONG TERM GOAL #1   Title Patient to be independent with advanced HEP.    Time 8    Period Weeks    Status On-going    Target Date  10/04/21      PT LONG TERM GOAL #2   Title Patient to demonstrate B LE strength >/=4+/5.    Time 8    Period Weeks    Status On-going    Target Date 10/04/21      PT LONG TERM GOAL #3   Title Patient to demonstrate 5xSTS test in <18 sec in order to decrease risk of falls.    Time 8    Period Weeks    Status On-going    Target Date 10/04/21      PT LONG TERM GOAL #4   Title Patient to score at least 18/24 on DGI in order to decrease risk of falls.    Time 8    Period Weeks    Status On-going    Target Date 10/04/21      PT LONG TERM GOAL #5   Title Patient to demonstrate normalized step length with knee extension at heel strike and stance phase of gait with LRAD.    Time 8    Period Weeks    Status On-going    Target Date 10/04/21      PT LONG TERM GOAL #6   Title Patient to report tolerance for 1/2 mile walk with LRAD for safety.    Time 8    Period Weeks    Status On-going    Target Date 10/04/21      PT LONG TERM GOAL #7   Title Patient to perform tall kneeling <> stand with mod I and good stability for improved safety with gardening.    Time 8    Period Weeks    Status On-going    Target Date 10/04/21                   Plan - 09/07/21 1600     Clinical Impression Statement Pt without complaints in PT session today; focused on balance exercises on compliant surfaces as well as single limb stance.  Pt responds well to visual cues using mirror to help with step length with compliant surface stepping exercises.  Trial of treadmill on level surface at very low speed, and pt uses signficant UE support and has decreased forward translation of bilat lower extremities while on treadmill.  She continues to have difficulty sustaining heel strike and large step length, though she continues to report she is not having trouble with gait on outdoor and incline/decline surfaces at home.  She  will continue to benefit from skilled PT towards goals for improved functional  mobility.    Personal Factors and Comorbidities Age;Fitness;Comorbidity 2;Time since onset of injury/illness/exacerbation;Past/Current Experience    Comorbidities anxiety, osteoporosis    Examination-Activity Limitations Bathing;Locomotion Level;Bend;Carry;Squat;Dressing;Stairs;Hygiene/Grooming;Stand;Lift    Examination-Participation Restrictions Yard Work;Shop;Laundry;Community Activity;Cleaning;Church;Meal Prep    Stability/Clinical Decision Making Evolving/Moderate complexity    Rehab Potential Good    PT Frequency 2x / week    PT Duration 8 weeks    PT Treatment/Interventions ADLs/Self Care Home Management;Canalith Repostioning;Cryotherapy;Electrical Stimulation;DME Instruction;Moist Heat;Gait training;Stair training;Functional mobility training;Therapeutic activities;Therapeutic exercise;Balance training;Neuromuscular re-education;Manual techniques;Patient/family education;Passive range of motion;Dry needling;Energy conservation;Vestibular;Taping    PT Next Visit Plan Continue RLE and LLE stregnthening, STS transfer technique, gait training with walking poles, balance towards LTGs    Consulted and Agree with Plan of Care Patient             Patient will benefit from skilled therapeutic intervention in order to improve the following deficits and impairments:  Abnormal gait, Difficulty walking, Decreased activity tolerance, Decreased balance, Decreased knowledge of use of DME, Impaired flexibility, Improper body mechanics, Postural dysfunction, Decreased strength  Visit Diagnosis: Other abnormalities of gait and mobility  Unsteadiness on feet     Problem List Patient Active Problem List   Diagnosis Date Noted   Hemoptysis 09/12/2016    Frazier Butt., PT 09/07/2021, 4:05 PM  [facility] [address], [place], [place], [address] Phone: [phone]   Fax:  [phone]  Name: Christina Patton MRN: [id number] Date of Birth:  October 02, 1945

## 2021-09-12 ENCOUNTER — Other Ambulatory Visit: Payer: Self-pay

## 2021-09-12 ENCOUNTER — Ambulatory Visit: Payer: Medicare HMO

## 2021-09-12 DIAGNOSIS — R2689 Other abnormalities of gait and mobility: Secondary | ICD-10-CM

## 2021-09-12 DIAGNOSIS — M6281 Muscle weakness (generalized): Secondary | ICD-10-CM | POA: Diagnosis not present

## 2021-09-12 DIAGNOSIS — R293 Abnormal posture: Secondary | ICD-10-CM | POA: Diagnosis not present

## 2021-09-12 DIAGNOSIS — R2681 Unsteadiness on feet: Secondary | ICD-10-CM | POA: Diagnosis not present

## 2021-09-12 NOTE — Therapy (Signed)
[facility] [address], [place], [place], [address] Phone: [phone]   Fax:  [phone]  Physical Therapy Treatment  Patient Details  Name: Christina Patton MRN: [id number] Date of Birth: 05-29-1946 Referring Provider (PT): Theadore Nan, MD   Encounter Date: 09/12/2021   PT End of Session - 09/12/21 1404     Visit Number 9    Number of Visits 17    Date for PT Re-Evaluation 10/04/21    Authorization Type A[employer]na Medicare    PT Start Time 1403    PT Stop Time 1445    PT Time Calculation (min) 42 min    Equipment Utilized During Treatment Gait belt    Activity Tolerance Patient tolerated treatment well    Behavior During Therapy [facility] for tasks assessed/performed             Past Medical History:  Diagnosis Date   Anxiety    Cancer ([place])    skin cancer on face   Osteoporosis     No past surgical history on file.  There were no vitals filed for this visit.   Subjective Assessment - 09/12/21 1453     Subjective Pt notes balance is her main issue/deficit that she is trying to improve    Pertinent History anxiety, osteoporosis    Diagnostic tests none recent    Patient Stated Goals improve balance and strength    Currently in Pain? No/denies                               [facility] Adult PT Treatment/Exercise - 09/12/21 0001       Neuro Re-ed    Neuro Re-ed Details  static rockerboard balance an/tpos and R/L balance and wt shifts with light B UE support on II bars and CGA; R/L fwd/back stepping over 1/2 foam with heel touch x15 each with variable UE support; multidirectional 3 cone tap without UE support; R/L weight shifts. Unilat farmer's carry march 4x15 ft 6#. Sidestep, squat, forward press 6# 2x15 ft      Knee/Hip Exercises: Aerobic   Nustep L5 x 7 min (BUEs/BLEs); cues to maintain >/=60 SPM      Knee/Hip Exercises: Seated   Sit to Sand 2 sets;10 reps;without UE support   with single LE  elevated on 2" box to load LE, alternating Q 10 reps                      PT Short Term Goals - 08/15/21 1448       PT SHORT TERM GOAL #1   Title Patient to be independent with initial HEP.    Time 3    Period Weeks    Status Achieved    Target Date 08/30/21               PT Long Term Goals - 08/15/21 1448       PT LONG TERM GOAL #1   Title Patient to be independent with advanced HEP.    Time 8    Period Weeks    Status On-going    Target Date 10/04/21      PT LONG TERM GOAL #2   Title Patient to demonstrate B LE strength >/=4+/5.    Time 8    Period Weeks    Status On-going    Target Date 10/04/21      PT LONG  TERM GOAL #3   Title Patient to demonstrate 5xSTS test in <18 sec in order to decrease risk of falls.    Time 8    Period Weeks    Status On-going    Target Date 10/04/21      PT LONG TERM GOAL #4   Title Patient to score at least 18/24 on DGI in order to decrease risk of falls.    Time 8    Period Weeks    Status On-going    Target Date 10/04/21      PT LONG TERM GOAL #5   Title Patient to demonstrate normalized step length with knee extension at heel strike and stance phase of gait with LRAD.    Time 8    Period Weeks    Status On-going    Target Date 10/04/21      PT LONG TERM GOAL #6   Title Patient to report tolerance for 1/2 mile walk with LRAD for safety.    Time 8    Period Weeks    Status On-going    Target Date 10/04/21      PT LONG TERM GOAL #7   Title Patient to perform tall kneeling <> stand with mod I and good stability for improved safety with gardening.    Time 8    Period Weeks    Status On-going    Target Date 10/04/21                   Plan - 09/12/21 1454     Clinical Impression Statement Progressing with dynamic balance activities to improve BLE strength, coordination, and facilitation of righting reactions to reduce risk for falls.  Tolerating tx activities without incident and progressing  with STG/LTG. Progress note on next session    Personal Factors and Comorbidities Age;Fitness;Comorbidity 2;Time since onset of injury/illness/exacerbation;Past/Current Experience    Comorbidities anxiety, osteoporosis    Examination-Activity Limitations Bathing;Locomotion Level;Bend;Carry;Squat;Dressing;Stairs;Hygiene/Grooming;Stand;Lift    Examination-Participation Restrictions Yard Work;Shop;Laundry;Community Activity;Cleaning;Church;Meal Prep    Stability/Clinical Decision Making Evolving/Moderate complexity    Rehab Potential Good    PT Frequency 2x / week    PT Duration 8 weeks    PT Treatment/Interventions ADLs/Self Care Home Management;Canalith Repostioning;Cryotherapy;Electrical Stimulation;DME Instruction;Moist Heat;Gait training;Stair training;Functional mobility training;Therapeutic activities;Therapeutic exercise;Balance training;Neuromuscular re-education;Manual techniques;Patient/family education;Passive range of motion;Dry needling;Energy conservation;Vestibular;Taping    PT Next Visit Plan Progress note    Consulted and Agree with Plan of Care Patient             Patient will benefit from skilled therapeutic intervention in order to improve the following deficits and impairments:  Abnormal gait, Difficulty walking, Decreased activity tolerance, Decreased balance, Decreased knowledge of use of DME, Impaired flexibility, Improper body mechanics, Postural dysfunction, Decreased strength  Visit Diagnosis: Other abnormalities of gait and mobility  Unsteadiness on feet  Muscle weakness (generalized)  Abnormal posture     Problem List Patient Active Problem List   Diagnosis Date Noted   Hemoptysis 09/12/2016    Toniann Fail, PT 09/12/2021, 2:57 PM  [facility] [address] [place], [place], [address] Phone: [phone]   Fax:  [phone]  Name: Christina Patton MRN: [id number] Date of Birth: [birth year]

## 2021-09-14 ENCOUNTER — Ambulatory Visit: Payer: Medicare HMO

## 2021-09-14 ENCOUNTER — Other Ambulatory Visit: Payer: Self-pay

## 2021-09-14 DIAGNOSIS — M6281 Muscle weakness (generalized): Secondary | ICD-10-CM

## 2021-09-14 DIAGNOSIS — R293 Abnormal posture: Secondary | ICD-10-CM

## 2021-09-14 DIAGNOSIS — R2681 Unsteadiness on feet: Secondary | ICD-10-CM | POA: Diagnosis not present

## 2021-09-14 DIAGNOSIS — R2689 Other abnormalities of gait and mobility: Secondary | ICD-10-CM

## 2021-09-14 NOTE — Therapy (Signed)
[facility] [address], [place], [place], [address] Phone: [phone]   Fax:  [phone]  Physical Therapy Treatment, Progress Note, and D/C Summary  Patient Details  Name: Christina Patton MRN: [id number] Date of Birth: June 14, 1946 Referring Provider (PT): Theadore Nan, MD Progress Note Reporting Period 08/09/21 to 09/14/21  PHYSICAL THERAPY DISCHARGE SUMMARY  Visits from Start of Care: 10  Current functional level related to goals / functional outcomes: Pt able to meet STG/LTG    Remaining deficits: Minor balance deficits per DGI   Education / Equipment: HEP   Patient agrees to discharge. Patient goals were met. Patient is being discharged due to meeting the stated rehab goals.   See note below for Objective Data and Assessment of Progress/Goals.      Encounter Date: 09/14/2021   PT End of Session - 09/14/21 1448     Visit Number 10    Number of Visits 17    Date for PT Re-Evaluation 10/04/21    Authorization Type A[employer]na Medicare    PT Start Time 0160    PT Stop Time 1530    PT Time Calculation (min) 45 min    Equipment Utilized During Treatment Gait belt    Activity Tolerance Patient tolerated treatment well    Behavior During Therapy WFL for tasks assessed/performed             Past Medical History:  Diagnosis Date   Anxiety    Cancer ([place])    skin cancer on face   Osteoporosis     No past surgical history on file.  There were no vitals filed for this visit.   Subjective Assessment - 09/14/21 1447     Subjective No new issues noted. Pt understands objectives for progress note    Pertinent History anxiety, osteoporosis    Diagnostic tests none recent    Patient Stated Goals improve balance and strength                [facility] PT Assessment - 09/14/21 0001       Assessment   Medical Diagnosis Balance problem    Referring Provider (PT) Theadore Nan, MD      Strength   Right Hip Flexion  4+/5    Right Hip ABduction 4+/5    Right Hip ADduction 4+/5    Left Hip Flexion 4+/5    Left Hip ABduction 4+/5    Left Hip ADduction 4+/5    Right Knee Flexion 4+/5    Right Knee Extension 4+/5    Left Knee Flexion 4+/5    Left Knee Extension 4+/5    Right Ankle Dorsiflexion 5/5    Right Ankle Plantar Flexion 5/5    Left Ankle Dorsiflexion 5/5    Left Ankle Plantar Flexion 5/5      Transfers   Five time sit to stand comments  11 sec      Dynamic Gait Index   Level Surface Normal    Change in Gait Speed Normal    Gait with Horizontal Head Turns Mild Impairment    Gait with Vertical Head Turns Mild Impairment    Gait and Pivot Turn Normal    Step Over Obstacle Mild Impairment    Step Around Obstacles Normal    Steps Normal    Total Score 21  [facility] Adult PT Treatment/Exercise - 09/14/21 0001       Self-Care   Self-Care Other Self-Care Comments    Other Self-Care Comments  education on carryover of LE PRE and benefits for reducing fall risk and frailty      Therapeutic Activites    Therapeutic Activities Other Therapeutic Activities    Other Therapeutic Activities floor to stand trials with visual demo first and able to progress to modified independence after 2 trials. Trial of 3 techniques with good return demonstration                       PT Short Term Goals - 08/15/21 1448       PT SHORT TERM GOAL #1   Title Patient to be independent with initial HEP.    Time 3    Period Weeks    Status Achieved    Target Date 08/30/21               PT Long Term Goals - 09/14/21 1449       PT LONG TERM GOAL #1   Title Patient to be independent with advanced HEP.    Time 8    Period Weeks    Status Achieved    Target Date 10/04/21      PT LONG TERM GOAL #2   Title Patient to demonstrate B LE strength >/=4+/5.    Baseline 4+/5 gross hip/knee strength, ankles 5/5/    Time 8    Period Weeks    Status  Achieved    Target Date 10/04/21      PT LONG TERM GOAL #3   Title Patient to demonstrate 5xSTS test in <18 sec in order to decrease risk of falls.    Baseline 11 sec    Time 8    Period Weeks    Status Achieved    Target Date 10/04/21      PT LONG TERM GOAL #4   Title Patient to score at least 18/24 on DGI in order to decrease risk of falls.    Baseline 21/24 indicating low risk for falls    Time 8    Period Weeks    Status Achieved    Target Date 10/04/21      PT LONG TERM GOAL #5   Title Patient to demonstrate normalized step length with knee extension at heel strike and stance phase of gait with LRAD.    Baseline normal walking pattern level surfaces without deviation    Time 8    Period Weeks    Status Achieved    Target Date 10/04/21      PT LONG TERM GOAL #6   Title Patient to report tolerance for 1/2 mile walk with LRAD for safety.    Baseline able to walk 1/2 mile without AD on firm surface, walking stick for soft ground    Time 8    Period Weeks    Status Achieved    Target Date 10/04/21      PT LONG TERM GOAL #7   Title Patient to perform tall kneeling <> stand with mod I and good stability for improved safety with gardening.    Baseline independent    Time 8    Period Weeks    Status Achieved    Target Date 10/04/21                   Plan - 09/14/21 1537  Clinical Impression Statement Able to meet STG/LTG and will D/C to HEP. All concerns addressed, no further questions from patient. Continue with HEP and community-based exercise/balance activities    Personal Factors and Comorbidities Age;Fitness;Comorbidity 2;Time since onset of injury/illness/exacerbation;Past/Current Experience    Comorbidities anxiety, osteoporosis    Examination-Activity Limitations Bathing;Locomotion Level;Bend;Carry;Squat;Dressing;Stairs;Hygiene/Grooming;Stand;Lift    Examination-Participation Restrictions Yard Work;Shop;Laundry;Community Activity;Cleaning;Church;Meal  Prep    Stability/Clinical Decision Making Evolving/Moderate complexity    Rehab Potential Good    PT Frequency 2x / week    PT Duration 8 weeks    PT Treatment/Interventions ADLs/Self Care Home Management;Canalith Repostioning;Cryotherapy;Electrical Stimulation;DME Instruction;Moist Heat;Gait training;Stair training;Functional mobility training;Therapeutic activities;Therapeutic exercise;Balance training;Neuromuscular re-education;Manual techniques;Patient/family education;Passive range of motion;Dry needling;Energy conservation;Vestibular;Taping    PT Next Visit Plan Progress note    Consulted and Agree with Plan of Care Patient             Patient will benefit from skilled therapeutic intervention in order to improve the following deficits and impairments:  Abnormal gait, Difficulty walking, Decreased activity tolerance, Decreased balance, Decreased knowledge of use of DME, Impaired flexibility, Improper body mechanics, Postural dysfunction, Decreased strength  Visit Diagnosis: Other abnormalities of gait and mobility  Unsteadiness on feet  Muscle weakness (generalized)  Abnormal posture     Problem List Patient Active Problem List   Diagnosis Date Noted   Hemoptysis 09/12/2016    Toniann Fail, PT 09/14/2021, 3:39 PM  [facility] [address] [place], [place], [address] Phone: [phone]   Fax:  [phone]  Name: Christina Patton MRN: [id number] Date of Birth: [birth year]

## 2021-09-19 ENCOUNTER — Ambulatory Visit: Payer: Medicare HMO

## 2021-09-21 ENCOUNTER — Ambulatory Visit: Payer: Medicare HMO

## 2021-09-26 ENCOUNTER — Ambulatory Visit: Payer: Medicare HMO

## 2021-09-27 DIAGNOSIS — L309 Dermatitis, unspecified: Secondary | ICD-10-CM | POA: Diagnosis not present

## 2021-09-28 ENCOUNTER — Ambulatory Visit: Payer: Medicare HMO | Admitting: Physical Therapy

## 2021-10-02 ENCOUNTER — Ambulatory Visit (INDEPENDENT_AMBULATORY_CARE_PROVIDER_SITE_OTHER): Payer: Medicare HMO

## 2021-10-02 ENCOUNTER — Encounter: Payer: Self-pay | Admitting: Orthopedic Surgery

## 2021-10-02 ENCOUNTER — Ambulatory Visit: Payer: Medicare HMO | Admitting: Orthopedic Surgery

## 2021-10-02 ENCOUNTER — Other Ambulatory Visit: Payer: Self-pay

## 2021-10-02 VITALS — Ht 64.0 in | Wt 128.0 lb

## 2021-10-02 DIAGNOSIS — M79671 Pain in right foot: Secondary | ICD-10-CM | POA: Diagnosis not present

## 2021-10-02 NOTE — Progress Notes (Signed)
? ?  Office Visit Note ?  ?Patient: Christina Patton           ?Date of Birth: 1946-04-17           ?MRN: [id number] ?Visit Date: 10/02/2021 ?             ?Requested by: Cari Caraway, MD ?[facility] ?[place],  [place] [address] ?PCP: Cari Caraway, MD ? ?Chief Complaint  ?Patient presents with  ? Right Foot - Pain  ? ? ? ? ?HPI: ?Patient is a 76 year old woman who presents for initial evaluation for right forefoot pain that is been present for several months.  Patient states she has had surgery on the fourth toe about 20 years ago and states the toe is now curling back under the third toe.  Patient also complains of painful corns between the great toe and second toe. ? ?Assessment & Plan: ?Visit Diagnoses:  ?1. Pain in right foot   ? ? ?Plan: Recommended a wide new balance walking sneaker and silicone pads for the second toe and fourth toe.  Will reevaluate in 4 weeks. ? ?Follow-Up Instructions: Return in about 4 weeks (around 10/30/2021).  ? ?Ortho Exam ? ?Patient is alert, oriented, no adenopathy, well-dressed, normal affect, normal respiratory effort. ?Examination patient has a good dorsalis pedis and posterior tibial pulse.  She has hyperkeratotic corn on the PIP joint first webspace of the second toe as well as a corn on the fourth toe third webspace.  After informed consent a 10 blade knife was used to pare the corns.  Silicone pads were applied patient states that her foot did feel better.  There is no redness cellulitis ulceration or drainage. ? ?Imaging: ?XR Foot Complete Right ? ?Result Date: 10/02/2021 ?Three-view radiographs of the right foot shows no bony abnormality no fractures.  ?No images are attached to the encounter. ? ?Labs: ?No results found for: HGBA1C, ESRSEDRATE, CRP, LABURIC, REPTSTATUS, GRAMSTAIN, CULT, LABORGA ? ? ?No results found for: ALBUMIN, PREALBUMIN, CBC ? ?No results found for: MG ?No results found for: VD25OH ? ?No results found for: PREALBUMIN ?No flowsheet data found. ? ? ?Body  mass index is 21.97 kg/m?. ? ?Orders:  ?Orders Placed This Encounter  ?Procedures  ? XR Foot Complete Right  ? ?No orders of the defined types were placed in this encounter. ? ? ? Procedures: ?No procedures performed ? ?Clinical Data: ?No additional findings. ? ?ROS: ? ?All other systems negative, except as noted in the HPI. ?Review of Systems ? ?Objective: ?Vital Signs: Ht '5\' 4"'$  (1.626 m)   Wt 128 lb (58.1 kg)   BMI 21.97 kg/m?  ? ?Specialty Comments:  ?No specialty comments available. ? ?PMFS History: ?Patient Active Problem List  ? Diagnosis Date Noted  ? Hemoptysis 09/12/2016  ? ?Past Medical History:  ?Diagnosis Date  ? Anxiety   ? Cancer [facility])   ? skin cancer on face  ? Osteoporosis   ?  ?Family History  ?Problem Relation Age of Onset  ? Ovarian cancer Mother   ? Breast cancer Neg Hx   ?  ?History reviewed. No pertinent surgical history. ?Social History  ? ?Occupational History  ? Not on file  ?Tobacco Use  ? Smoking status: Never  ? Smokeless tobacco: Never  ?Substance and Sexual Activity  ? Alcohol use: Not on file  ? Drug use: Not on file  ? Sexual activity: Not on file  ? ? ? ? ? ?

## 2021-10-03 ENCOUNTER — Ambulatory Visit: Payer: Medicare HMO

## 2021-10-05 DIAGNOSIS — G4700 Insomnia, unspecified: Secondary | ICD-10-CM | POA: Diagnosis not present

## 2021-10-05 DIAGNOSIS — F419 Anxiety disorder, unspecified: Secondary | ICD-10-CM | POA: Diagnosis not present

## 2021-10-05 DIAGNOSIS — R69 Illness, unspecified: Secondary | ICD-10-CM | POA: Diagnosis not present

## 2021-10-16 DIAGNOSIS — F419 Anxiety disorder, unspecified: Secondary | ICD-10-CM | POA: Diagnosis not present

## 2021-10-16 DIAGNOSIS — R69 Illness, unspecified: Secondary | ICD-10-CM | POA: Diagnosis not present

## 2021-10-16 DIAGNOSIS — F331 Major depressive disorder, recurrent, moderate: Secondary | ICD-10-CM | POA: Diagnosis not present

## 2021-10-19 DIAGNOSIS — L309 Dermatitis, unspecified: Secondary | ICD-10-CM | POA: Diagnosis not present

## 2021-10-25 DIAGNOSIS — F411 Generalized anxiety disorder: Secondary | ICD-10-CM | POA: Diagnosis not present

## 2021-10-25 DIAGNOSIS — R69 Illness, unspecified: Secondary | ICD-10-CM | POA: Diagnosis not present

## 2021-10-28 DIAGNOSIS — N3001 Acute cystitis with hematuria: Secondary | ICD-10-CM | POA: Diagnosis not present

## 2021-10-29 DIAGNOSIS — N3001 Acute cystitis with hematuria: Secondary | ICD-10-CM | POA: Diagnosis not present

## 2021-10-31 ENCOUNTER — Ambulatory Visit (INDEPENDENT_AMBULATORY_CARE_PROVIDER_SITE_OTHER): Payer: Medicare HMO | Admitting: Orthopedic Surgery

## 2021-10-31 DIAGNOSIS — M79671 Pain in right foot: Secondary | ICD-10-CM | POA: Diagnosis not present

## 2021-10-31 DIAGNOSIS — M205X1 Other deformities of toe(s) (acquired), right foot: Secondary | ICD-10-CM | POA: Diagnosis not present

## 2021-11-03 ENCOUNTER — Encounter: Payer: Self-pay | Admitting: Orthopedic Surgery

## 2021-11-03 NOTE — Progress Notes (Signed)
? ?  Office Visit Note ?  ?Patient: Christina Patton           ?Date of Birth: 21-Aug-1945           ?MRN: [id number] ?Visit Date: 10/31/2021 ?             ?Requested by: Cari Caraway, MD ?[address] ?[place],  [place] [address] ?PCP: Cari Caraway, MD ? ?Chief Complaint  ?Patient presents with  ? Right Foot - Pain, Follow-up  ? ? ? ? ?HPI: ?Patient is a 76 year old woman who presents complaining of pain in the right foot and toes.  She states the fourth toe is curling under the third toe and she has corns between the great toe and second toe.  Patient states that she has obtained wider new balance sneakers and use silicone pads and this has helped. ? ?Assessment & Plan: ?Visit Diagnoses:  ?1. Pain in right foot   ?2. Claw toe, acquired, right   ? ? ?Plan: Recommended antifungal powder for the dry cracked skin as well as smart wool socks. ? ?Follow-Up Instructions: Return if symptoms worsen or fail to improve.  ? ?Ortho Exam ? ?Patient is alert, oriented, no adenopathy, well-dressed, normal affect, normal respiratory effort. ?Examination patient has good pulses she has dry cracked skin between the toes consistent with fungus.  There are no open ulcers no cellulitis no sausage digit swelling.  She does have clawing of the toes. ? ?Imaging: ?No results found. ?No images are attached to the encounter. ? ?Labs: ?No results found for: HGBA1C, ESRSEDRATE, CRP, LABURIC, REPTSTATUS, GRAMSTAIN, CULT, LABORGA ? ? ?No results found for: ALBUMIN, PREALBUMIN, CBC ? ?No results found for: MG ?No results found for: VD25OH ? ?No results found for: PREALBUMIN ?   ? View : No data to display.  ?  ?  ?  ? ? ? ?There is no height or weight on file to calculate BMI. ? ?Orders:  ?No orders of the defined types were placed in this encounter. ? ?No orders of the defined types were placed in this encounter. ? ? ? Procedures: ?No procedures performed ? ?Clinical Data: ?No additional findings. ? ?ROS: ? ?All other systems negative, except as  noted in the HPI. ?Review of Systems ? ?Objective: ?Vital Signs: There were no vitals taken for this visit. ? ?Specialty Comments:  ?No specialty comments available. ? ?PMFS History: ?Patient Active Problem List  ? Diagnosis Date Noted  ? Hemoptysis 09/12/2016  ? ?Past Medical History:  ?Diagnosis Date  ? Anxiety   ? Cancer [facility])   ? skin cancer on face  ? Osteoporosis   ?  ?Family History  ?Problem Relation Age of Onset  ? Ovarian cancer Mother   ? Breast cancer Neg Hx   ?  ?History reviewed. No pertinent surgical history. ?Social History  ? ?Occupational History  ? Not on file  ?Tobacco Use  ? Smoking status: Never  ? Smokeless tobacco: Never  ?Substance and Sexual Activity  ? Alcohol use: Not on file  ? Drug use: Not on file  ? Sexual activity: Not on file  ? ? ? ? ? ?

## 2021-11-10 DIAGNOSIS — M85851 Other specified disorders of bone density and structure, right thigh: Secondary | ICD-10-CM | POA: Diagnosis not present

## 2021-11-10 DIAGNOSIS — R300 Dysuria: Secondary | ICD-10-CM | POA: Diagnosis not present

## 2021-11-10 DIAGNOSIS — M170 Bilateral primary osteoarthritis of knee: Secondary | ICD-10-CM | POA: Diagnosis not present

## 2021-11-10 DIAGNOSIS — R69 Illness, unspecified: Secondary | ICD-10-CM | POA: Diagnosis not present

## 2021-11-10 DIAGNOSIS — R1031 Right lower quadrant pain: Secondary | ICD-10-CM | POA: Diagnosis not present

## 2021-11-10 DIAGNOSIS — K5901 Slow transit constipation: Secondary | ICD-10-CM | POA: Diagnosis not present

## 2021-11-10 DIAGNOSIS — G4700 Insomnia, unspecified: Secondary | ICD-10-CM | POA: Diagnosis not present

## 2021-11-26 DIAGNOSIS — N3000 Acute cystitis without hematuria: Secondary | ICD-10-CM | POA: Diagnosis not present

## 2021-12-07 DIAGNOSIS — R309 Painful micturition, unspecified: Secondary | ICD-10-CM | POA: Diagnosis not present

## 2021-12-14 DIAGNOSIS — H52223 Regular astigmatism, bilateral: Secondary | ICD-10-CM | POA: Diagnosis not present

## 2021-12-14 DIAGNOSIS — Z961 Presence of intraocular lens: Secondary | ICD-10-CM | POA: Diagnosis not present

## 2021-12-14 DIAGNOSIS — H524 Presbyopia: Secondary | ICD-10-CM | POA: Diagnosis not present

## 2022-01-09 DIAGNOSIS — Z8041 Family history of malignant neoplasm of ovary: Secondary | ICD-10-CM | POA: Diagnosis not present

## 2022-01-09 DIAGNOSIS — M85851 Other specified disorders of bone density and structure, right thigh: Secondary | ICD-10-CM | POA: Diagnosis not present

## 2022-01-09 DIAGNOSIS — Z79899 Other long term (current) drug therapy: Secondary | ICD-10-CM | POA: Diagnosis not present

## 2022-01-15 DIAGNOSIS — K5901 Slow transit constipation: Secondary | ICD-10-CM | POA: Diagnosis not present

## 2022-01-15 DIAGNOSIS — F411 Generalized anxiety disorder: Secondary | ICD-10-CM | POA: Diagnosis not present

## 2022-01-15 DIAGNOSIS — Z23 Encounter for immunization: Secondary | ICD-10-CM | POA: Diagnosis not present

## 2022-01-15 DIAGNOSIS — M170 Bilateral primary osteoarthritis of knee: Secondary | ICD-10-CM | POA: Diagnosis not present

## 2022-01-15 DIAGNOSIS — R69 Illness, unspecified: Secondary | ICD-10-CM | POA: Diagnosis not present

## 2022-01-15 DIAGNOSIS — M85851 Other specified disorders of bone density and structure, right thigh: Secondary | ICD-10-CM | POA: Diagnosis not present

## 2022-01-17 ENCOUNTER — Encounter (HOSPITAL_BASED_OUTPATIENT_CLINIC_OR_DEPARTMENT_OTHER): Payer: Self-pay

## 2022-01-19 DIAGNOSIS — Z23 Encounter for immunization: Secondary | ICD-10-CM | POA: Diagnosis not present

## 2022-01-19 DIAGNOSIS — Z1389 Encounter for screening for other disorder: Secondary | ICD-10-CM | POA: Diagnosis not present

## 2022-01-19 DIAGNOSIS — Z0000 Encounter for general adult medical examination without abnormal findings: Secondary | ICD-10-CM | POA: Diagnosis not present

## 2022-01-24 ENCOUNTER — Ambulatory Visit: Payer: Medicare HMO

## 2022-01-24 DIAGNOSIS — R2689 Other abnormalities of gait and mobility: Secondary | ICD-10-CM

## 2022-01-24 DIAGNOSIS — T18108A Unspecified foreign body in esophagus causing other injury, initial encounter: Secondary | ICD-10-CM | POA: Diagnosis not present

## 2022-01-24 DIAGNOSIS — R2681 Unsteadiness on feet: Secondary | ICD-10-CM

## 2022-01-24 DIAGNOSIS — T189XXA Foreign body of alimentary tract, part unspecified, initial encounter: Secondary | ICD-10-CM | POA: Diagnosis not present

## 2022-01-24 DIAGNOSIS — K225 Diverticulum of esophagus, acquired: Secondary | ICD-10-CM | POA: Diagnosis not present

## 2022-01-24 DIAGNOSIS — M6281 Muscle weakness (generalized): Secondary | ICD-10-CM

## 2022-01-24 DIAGNOSIS — R1310 Dysphagia, unspecified: Secondary | ICD-10-CM | POA: Diagnosis not present

## 2022-01-24 DIAGNOSIS — K222 Esophageal obstruction: Secondary | ICD-10-CM | POA: Diagnosis not present

## 2022-01-24 DIAGNOSIS — T18128A Food in esophagus causing other injury, initial encounter: Secondary | ICD-10-CM | POA: Diagnosis not present

## 2022-01-24 NOTE — Therapy (Signed)
OUTPATIENT PHYSICAL THERAPY NEURO EVALUATION   Patient Name: Christina Patton MRN: [id number] DOB:[birth year], 76 y.o., female Today's Date: 01/24/2022   PCP: Cari Caraway, MD  REFERRING PROVIDER: Cari Caraway, MD    PT End of Session - 01/24/22 1030     Visit Number 1    Number of Visits 12    Date for PT Re-Evaluation 03/07/22    Authorization Type [employer]na/Medicare    PT Start Time 0930    PT Stop Time 1015    PT Time Calculation (min) 45 min    Activity Tolerance Patient tolerated treatment well    Behavior During Therapy [facility] for tasks assessed/performed             Past Medical History:  Diagnosis Date   Anxiety    Cancer ([place])    skin cancer on face   Osteoporosis    No past surgical history on file. Patient Active Problem List   Diagnosis Date Noted   Hemoptysis 09/12/2016    ONSET DATE: past 6 months  REFERRING DIAG: R26.89 (ICD-10-CM) - Other abnormalities of gait and mobility   THERAPY DIAG:  Unsteadiness on feet  Muscle weakness (generalized)  Other abnormalities of gait and mobility  Rationale for Evaluation and Treatment Rehabilitation  SUBJECTIVE:                                                                                                                                                                                              SUBJECTIVE STATEMENT: Pt notes long hx of knee pain and has recently had numerous family tragedies which has limited her activity participation and notes decline in ambulation and increased difficulty in performing gardening activities with difficulty in arising from the ground.  Pt notes having to rely on furniture walking and has developed a shuffling gait pattern Pt accompanied by: self  PERTINENT HISTORY: hx of PT at this clinic for balance issues in the past  PAIN:  Are you having pain? No slight "nuisance" in knees  PRECAUTIONS: None  WEIGHT BEARING RESTRICTIONS No  FALLS: Has patient fallen in last  6 months? No  LIVING ENVIRONMENT: Lives with: lives alone Lives in: House/apartment Stairs: Yes: Internal: 12 steps; can reach both and External: 3-4 steps; can reach both Has following equipment at home: Single point cane  PLOF: Independent  PATIENT GOALS Be able to arise from the ground for gardening activities, be able to walk without shuffling. Be able to walk nature trail on her property (with someone accompanying)  OBJECTIVE:   DIAGNOSTIC FINDINGS: nothing recently  COGNITION: Overall cognitive status: Within functional limits for tasks  assessed   SENSATION: WFL, has noticed more numbness/tingling on the soles of her feet over the past few months  COORDINATION: Deficits with LUE finger opposition, difficulty with rapid pronation/supination RUE   MUSCLE TONE: DNT   MUSCLE LENGTH: DNT  DTRs:  DNT  POSTURE: rounded shoulders  LOWER EXTREMITY ROM:     Active  Right Eval Left Eval  Hip flexion WNL WNL  Hip extension    Hip abduction WNL WNL  Hip adduction    Hip internal rotation    Hip external rotation    Knee flexion WNL WNL  Knee extension WNL WNL  Ankle dorsiflexion 8 10  Ankle plantarflexion    Ankle inversion    Ankle eversion     (Blank rows = not tested)  LOWER EXTREMITY MMT:    MMT Right Eval Left Eval  Hip flexion 4- 4-  Hip extension    Hip abduction 4- 3+  Hip adduction 4- 4-  Hip internal rotation    Hip external rotation    Knee flexion 4 4-  Knee extension 4 4-  Ankle dorsiflexion 4 4  Ankle plantarflexion 3 3  Ankle inversion    Ankle eversion    (Blank rows = not tested)  BED MOBILITY:  Independent  TRANSFERS: Assistive device utilized: None  Sit to stand: Complete Independence and Modified independence Stand to sit: Complete Independence Chair to chair: Complete Independence and Modified independence Floor: SBA, goes from kneeling on foam to knees-over-toes position to squat to pushing on knees; unsteady on  arising   CURB:  Level of Assistance: Modified independence Assistive device utilized: None Curb Comments: turns sideways  STAIRS:  Level of Assistance: Modified independence  Stair Negotiation Technique: Alternating Pattern  with Bilateral Rails  Number of Stairs: 6   Height of Stairs: 4-6"  Comments:   GAIT: Gait pattern: step through pattern, decreased step length- Right, and decreased hip/knee flexion- Right, SBA-CGA on uneven surfaces Distance walked:  Assistive device utilized: None Level of assistance: Complete Independence and Modified independence Comments: deviation/reduced speed  FUNCTIONAL TESTs:  5 times sit to stand: 21.41 sec with retro-LOB and heavy reliance on pushing knees against chair Timed up and go (TUG): 13.09 sec Berg Balance Scale: 47/56 Gait speed: 12.40 sec ; 2.6 ft/sec  PATIENT SURVEYS:    TODAY'S TREATMENT:  Evaluation, tandem stance 3x15 sec   PATIENT EDUCATION: Education details: assessment findings Person educated: Patient Education method: Explanation Education comprehension: verbalized understanding   HOME EXERCISE PROGRAM: Access Code: [email or web address] URL: [email or web address] Date: 01/24/2022 Prepared by: Sherlyn Lees  Exercises - Standing Tandem Balance with Counter Support  - 1 x daily - 7 x weekly - 3 sets - 15 sec hold    GOALS: Goals reviewed with patient? Yes  SHORT TERM GOALS: Target date: 02/14/2022  Patient will be independent in HEP to improve functional outcomes Baseline: Goal status: INITIAL  2.  Manifest improved BLE strength and balance as evidenced by 15 sec 5xSTS to improve ability to arise from the ground Baseline: 21.41 sec with retro-LOB and heavy reliance on pushing knees against chair Goal status: INITIAL   3. Increase bilateral hip abduction strength to 4/5 to improve single limb stance stability to improve safety with ADL  [place]: 3+ to 4-/5  Goal status: INITIAL    LONG TERM  GOALS: Target date: 03/07/2022  Manifest improved balance to reduce risk for falls and enable safe ambulation/activity in garden per score 52/56 Berg Balance Test Baseline: 47/56 Goal status:  INITIAL  2.  Report improved gait tolerance and balance as evidenced by ability to walk nature trail at her home with supervision Baseline: unable Goal status: INITIAL  3.  Demo independent floor/ground to stand recovery to improve independence and safety with gardening activities Baseline: SBA Goal status: INITIAL    ASSESSMENT:  CLINICAL IMPRESSION: Patient is a 76  y.o. lady who was seen today for physical therapy evaluation and treatment for gait/balance deviations with unsteadiness on feet.  Patient presents with balance and LE strength deficits as evidenced by manual muscle test and 5xSTS test which reveals compensatory strategies and inability to arise from ground without external support.  Pt reports overall decline in her walking tolerance and stability frequently relying on furniture walking in the home and increased difficulty with walking outside on uneven surfaces.  Pt would benefit from PT Services to increase LE strength, enhance dynamic balance, improve coordination, and reduce risk for falls to enable return to typical level of activity for improved quality of life.     OBJECTIVE IMPAIRMENTS Abnormal gait, decreased activity tolerance, decreased balance, decreased coordination, decreased endurance, decreased mobility, difficulty walking, and decreased strength.   ACTIVITY LIMITATIONS carrying, lifting, squatting, and transfers  PARTICIPATION LIMITATIONS: cleaning, laundry, and yard work  Boykin Age and Time since onset of injury/illness/exacerbation are also affecting patient's functional outcome.   REHAB POTENTIAL: Excellent  CLINICAL DECISION MAKING: Stable/uncomplicated  EVALUATION COMPLEXITY: Low  PLAN: PT FREQUENCY: 1-2x/week  PT DURATION: 6 weeks  PLANNED  INTERVENTIONS: Therapeutic exercises, Therapeutic activity, Neuromuscular re-education, Balance training, Gait training, Patient/Family education, Joint mobilization, Stair training, Vestibular training, Canalith repositioning, DME instructions, Aquatic Therapy, Dry Needling, Electrical stimulation, Spinal mobilization, Cryotherapy, Moist heat, Taping, and Manual therapy  PLAN FOR NEXT SESSION: HEP review (tandem stance 3x15 sec and rec to walk her driveway 4-9E/EFE). Trial lunges with trekking poles and built up/padded surface for knee   12:23 PM, 01/24/22 M. Sherlyn Lees, PT, DPT Physical Therapist- [place] Office Number: [phone]

## 2022-01-25 ENCOUNTER — Emergency Department (EMERGENCY_DEPARTMENT_HOSPITAL): Payer: Medicare HMO | Admitting: Anesthesiology

## 2022-01-25 ENCOUNTER — Encounter (HOSPITAL_BASED_OUTPATIENT_CLINIC_OR_DEPARTMENT_OTHER): Payer: Self-pay | Admitting: Emergency Medicine

## 2022-01-25 ENCOUNTER — Emergency Department (HOSPITAL_BASED_OUTPATIENT_CLINIC_OR_DEPARTMENT_OTHER): Payer: Medicare HMO | Admitting: Radiology

## 2022-01-25 ENCOUNTER — Inpatient Hospital Stay (HOSPITAL_BASED_OUTPATIENT_CLINIC_OR_DEPARTMENT_OTHER)
Admission: EM | Admit: 2022-01-25 | Discharge: 2022-01-27 | DRG: 395 | Disposition: A | Discharge status: Other Acute Inpatient Hospital | Payer: Medicare HMO | Attending: Pulmonary Disease | Admitting: Pulmonary Disease

## 2022-01-25 ENCOUNTER — Encounter (HOSPITAL_COMMUNITY)
Admission: EM | Disposition: A | Discharge status: Other Acute Inpatient Hospital | Payer: Self-pay | Source: Home / Self Care | Attending: Pulmonary Disease

## 2022-01-25 ENCOUNTER — Inpatient Hospital Stay (HOSPITAL_COMMUNITY): Payer: Medicare HMO

## 2022-01-25 ENCOUNTER — Emergency Department (HOSPITAL_BASED_OUTPATIENT_CLINIC_OR_DEPARTMENT_OTHER): Payer: Medicare HMO

## 2022-01-25 ENCOUNTER — Emergency Department (HOSPITAL_COMMUNITY): Payer: Medicare HMO | Admitting: Anesthesiology

## 2022-01-25 ENCOUNTER — Other Ambulatory Visit: Payer: Self-pay

## 2022-01-25 DIAGNOSIS — R0989 Other specified symptoms and signs involving the circulatory and respiratory systems: Secondary | ICD-10-CM | POA: Diagnosis not present

## 2022-01-25 DIAGNOSIS — Z4682 Encounter for fitting and adjustment of non-vascular catheter: Secondary | ICD-10-CM | POA: Diagnosis not present

## 2022-01-25 DIAGNOSIS — Z85828 Personal history of other malignant neoplasm of skin: Secondary | ICD-10-CM

## 2022-01-25 DIAGNOSIS — R509 Fever, unspecified: Secondary | ICD-10-CM | POA: Diagnosis not present

## 2022-01-25 DIAGNOSIS — T18128A Food in esophagus causing other injury, initial encounter: Secondary | ICD-10-CM

## 2022-01-25 DIAGNOSIS — Z79899 Other long term (current) drug therapy: Secondary | ICD-10-CM | POA: Diagnosis not present

## 2022-01-25 DIAGNOSIS — T189XXA Foreign body of alimentary tract, part unspecified, initial encounter: Secondary | ICD-10-CM | POA: Diagnosis not present

## 2022-01-25 DIAGNOSIS — T18108A Unspecified foreign body in esophagus causing other injury, initial encounter: Secondary | ICD-10-CM | POA: Diagnosis not present

## 2022-01-25 DIAGNOSIS — K225 Diverticulum of esophagus, acquired: Secondary | ICD-10-CM

## 2022-01-25 DIAGNOSIS — F419 Anxiety disorder, unspecified: Secondary | ICD-10-CM | POA: Diagnosis present

## 2022-01-25 DIAGNOSIS — M810 Age-related osteoporosis without current pathological fracture: Secondary | ICD-10-CM | POA: Diagnosis not present

## 2022-01-25 DIAGNOSIS — K222 Esophageal obstruction: Principal | ICD-10-CM

## 2022-01-25 DIAGNOSIS — Q396 Congenital diverticulum of esophagus: Secondary | ICD-10-CM

## 2022-01-25 DIAGNOSIS — Z882 Allergy status to sulfonamides status: Secondary | ICD-10-CM

## 2022-01-25 DIAGNOSIS — Z7983 Long term (current) use of bisphosphonates: Secondary | ICD-10-CM | POA: Diagnosis not present

## 2022-01-25 DIAGNOSIS — Z885 Allergy status to narcotic agent status: Secondary | ICD-10-CM | POA: Diagnosis not present

## 2022-01-25 DIAGNOSIS — R1310 Dysphagia, unspecified: Secondary | ICD-10-CM | POA: Diagnosis not present

## 2022-01-25 DIAGNOSIS — Z888 Allergy status to other drugs, medicaments and biological substances status: Secondary | ICD-10-CM | POA: Diagnosis not present

## 2022-01-25 DIAGNOSIS — D62 Acute posthemorrhagic anemia: Secondary | ICD-10-CM | POA: Diagnosis not present

## 2022-01-25 DIAGNOSIS — R69 Illness, unspecified: Secondary | ICD-10-CM | POA: Diagnosis not present

## 2022-01-25 DIAGNOSIS — R918 Other nonspecific abnormal finding of lung field: Secondary | ICD-10-CM | POA: Diagnosis not present

## 2022-01-25 DIAGNOSIS — A419 Sepsis, unspecified organism: Secondary | ICD-10-CM | POA: Diagnosis not present

## 2022-01-25 DIAGNOSIS — E878 Other disorders of electrolyte and fluid balance, not elsewhere classified: Secondary | ICD-10-CM | POA: Diagnosis not present

## 2022-01-25 DIAGNOSIS — T18128D Food in esophagus causing other injury, subsequent encounter: Secondary | ICD-10-CM | POA: Diagnosis not present

## 2022-01-25 DIAGNOSIS — Z978 Presence of other specified devices: Secondary | ICD-10-CM

## 2022-01-25 DIAGNOSIS — Z9911 Dependence on respirator [ventilator] status: Secondary | ICD-10-CM | POA: Diagnosis not present

## 2022-01-25 DIAGNOSIS — I959 Hypotension, unspecified: Secondary | ICD-10-CM | POA: Diagnosis not present

## 2022-01-25 HISTORY — DX: Diverticulum of esophagus, acquired: K22.5

## 2022-01-25 HISTORY — PX: ESOPHAGOSCOPY: SHX5534

## 2022-01-25 HISTORY — PX: FOREIGN BODY REMOVAL: SHX962

## 2022-01-25 HISTORY — PX: ESOPHAGOGASTRODUODENOSCOPY: SHX5428

## 2022-01-25 LAB — GLUCOSE, CAPILLARY: Glucose-Capillary: 100 mg/dL — ABNORMAL HIGH (ref 70–99)

## 2022-01-25 SURGERY — ESOPHAGOSCOPY
Anesthesia: General | Site: Throat

## 2022-01-25 SURGERY — EGD (ESOPHAGOGASTRODUODENOSCOPY)
Anesthesia: Monitor Anesthesia Care

## 2022-01-25 MED ORDER — FENTANYL 2500MCG IN NS 250ML (10MCG/ML) PREMIX INFUSION
0.0000 ug/h | INTRAVENOUS | Status: DC
  Administered 2022-01-26: 150 ug/h via INTRAVENOUS
  Administered 2022-01-26: 50 ug/h via INTRAVENOUS
  Administered 2022-01-26 – 2022-01-27 (×2): 150 ug/h via INTRAVENOUS
  Filled 2022-01-25 (×2): qty 250

## 2022-01-25 MED ORDER — GLYCOPYRROLATE 0.2 MG/ML IJ SOLN
INTRAMUSCULAR | Status: DC | PRN
  Administered 2022-01-25: .2 mg via INTRAVENOUS

## 2022-01-25 MED ORDER — PANTOPRAZOLE 2 MG/ML SUSPENSION
40.0000 mg | Freq: Every day | ORAL | Status: DC
  Filled 2022-01-25: qty 20

## 2022-01-25 MED ORDER — DEXTROSE-NACL 5-0.45 % IV SOLN: INTRAVENOUS | Status: DC

## 2022-01-25 MED ORDER — PROPOFOL 500 MG/50ML IV EMUL
INTRAVENOUS | Status: DC | PRN
  Administered 2022-01-25: 150 ug/kg/min via INTRAVENOUS

## 2022-01-25 MED ORDER — LACTATED RINGERS IV SOLN: INTRAVENOUS | Status: DC | PRN

## 2022-01-25 MED ORDER — FENTANYL CITRATE (PF) 100 MCG/2ML IJ SOLN
INTRAMUSCULAR | Status: AC
  Filled 2022-01-25: qty 2

## 2022-01-25 MED ORDER — CHLORHEXIDINE GLUCONATE CLOTH 2 % EX PADS
6.0000 | MEDICATED_PAD | Freq: Every day | CUTANEOUS | Status: DC
  Administered 2022-01-25 – 2022-01-27 (×2): 6 via TOPICAL

## 2022-01-25 MED ORDER — DEXAMETHASONE SODIUM PHOSPHATE 4 MG/ML IJ SOLN
INTRAMUSCULAR | Status: DC | PRN
  Administered 2022-01-25: 10 mg via INTRAVENOUS

## 2022-01-25 MED ORDER — ONDANSETRON HCL 4 MG/2ML IJ SOLN: 4.0000 mg | Freq: Four times a day (QID) | INTRAMUSCULAR | Status: DC | PRN

## 2022-01-25 MED ORDER — POLYETHYLENE GLYCOL 3350 17 G PO PACK: 17.0000 g | PACK | Freq: Every day | ORAL | Status: DC | PRN

## 2022-01-25 MED ORDER — ROCURONIUM BROMIDE 10 MG/ML (PF) SYRINGE
PREFILLED_SYRINGE | INTRAVENOUS | Status: DC | PRN
  Administered 2022-01-25: 30 mg via INTRAVENOUS
  Administered 2022-01-25: 50 mg via INTRAVENOUS

## 2022-01-25 MED ORDER — FENTANYL CITRATE (PF) 100 MCG/2ML IJ SOLN
INTRAMUSCULAR | Status: DC | PRN
  Administered 2022-01-25 (×2): 50 ug via INTRAVENOUS

## 2022-01-25 MED ORDER — ESMOLOL HCL 100 MG/10ML IV SOLN
INTRAVENOUS | Status: DC | PRN
  Administered 2022-01-25 (×5): 10 mg via INTRAVENOUS

## 2022-01-25 MED ORDER — PROPOFOL 500 MG/50ML IV EMUL
INTRAVENOUS | Status: DC | PRN
  Administered 2022-01-25: 125 ug/kg/min via INTRAVENOUS

## 2022-01-25 MED ORDER — EPHEDRINE SULFATE-NACL 50-0.9 MG/10ML-% IV SOSY
PREFILLED_SYRINGE | INTRAVENOUS | Status: DC | PRN
  Administered 2022-01-25 (×2): 10 mg via INTRAVENOUS

## 2022-01-25 MED ORDER — SUCCINYLCHOLINE CHLORIDE 200 MG/10ML IV SOSY
PREFILLED_SYRINGE | INTRAVENOUS | Status: DC | PRN
  Administered 2022-01-25: 100 mg via INTRAVENOUS

## 2022-01-25 MED ORDER — STERILE WATER FOR IRRIGATION IR SOLN
Status: DC | PRN
  Administered 2022-01-25: 1000 mL

## 2022-01-25 MED ORDER — ONDANSETRON HCL 4 MG/2ML IJ SOLN
INTRAMUSCULAR | Status: DC | PRN
  Administered 2022-01-25: 4 mg via INTRAVENOUS

## 2022-01-25 MED ORDER — SODIUM CHLORIDE 0.9 % IV SOLN
INTRAVENOUS | Status: DC
Start: 2022-01-25 — End: 2022-01-25

## 2022-01-25 MED ORDER — DOCUSATE SODIUM 50 MG/5ML PO LIQD: 100.0000 mg | Freq: Two times a day (BID) | ORAL | Status: DC | PRN

## 2022-01-25 MED ORDER — PROPOFOL 10 MG/ML IV BOLUS
INTRAVENOUS | Status: DC | PRN
  Administered 2022-01-25: 40 mg via INTRAVENOUS

## 2022-01-25 SURGICAL SUPPLY — 35 items
BAG COUNTER SPONGE SURGICOUNT (BAG) ×2 IMPLANT
BALLN PULM 15 16.5 18X75 (BALLOONS)
BALLOON PULM 15 16.5 18X75 (BALLOONS) ×1 IMPLANT
BLADE SURG 15 STRL LF DISP TIS (BLADE) IMPLANT
BLADE SURG 15 STRL SS (BLADE)
CANISTER SUCT 3000ML PPV (MISCELLANEOUS) ×2 IMPLANT
CNTNR URN SCR LID CUP LEK RST (MISCELLANEOUS) ×2 IMPLANT
CONT SPEC 4OZ STRL OR WHT (MISCELLANEOUS) ×2
COVER BACK TABLE 60X90IN (DRAPES) ×2 IMPLANT
DRAPE HALF SHEET 40X57 (DRAPES) ×2 IMPLANT
DRSG TELFA 3X8 NADH (GAUZE/BANDAGES/DRESSINGS) IMPLANT
GAUZE SPONGE 4X4 12PLY STRL (GAUZE/BANDAGES/DRESSINGS) ×2 IMPLANT
GLOVE BIO SURGEON STRL SZ7 (GLOVE) ×2 IMPLANT
GLOVE SKINSENSE NS SZ6.5 (GLOVE) ×1
GLOVE SKINSENSE STRL SZ6.5 (GLOVE) IMPLANT
GOWN STRL REUS W/ TWL XL LVL3 (GOWN DISPOSABLE) IMPLANT
GOWN STRL REUS W/TWL LRG LVL3 (GOWN DISPOSABLE) ×1 IMPLANT
GOWN STRL REUS W/TWL XL LVL3 (GOWN DISPOSABLE) ×2
KIT TURNOVER KIT B (KITS) ×2 IMPLANT
MARKER SKIN DUAL TIP RULER LAB (MISCELLANEOUS) ×1 IMPLANT
NDL HYPO 25GX1X1/2 BEV (NEEDLE) IMPLANT
NEEDLE HYPO 25GX1X1/2 BEV (NEEDLE) IMPLANT
NS IRRIG 1000ML POUR BTL (IV SOLUTION) ×2 IMPLANT
PAD ARMBOARD 7.5X6 YLW CONV (MISCELLANEOUS) ×4 IMPLANT
PAD DRESSING TELFA 3X8 NADH (GAUZE/BANDAGES/DRESSINGS) ×1 IMPLANT
PATTIES SURGICAL .5 X3 (DISPOSABLE) IMPLANT
PATTIES SURGICAL 1X1 (DISPOSABLE) IMPLANT
SPECIMEN JAR SMALL (MISCELLANEOUS) ×2 IMPLANT
SPONGE INTESTINAL PEANUT (DISPOSABLE) IMPLANT
SURGILUBE 2OZ TUBE FLIPTOP (MISCELLANEOUS) ×1 IMPLANT
TOWEL GREEN STERILE FF (TOWEL DISPOSABLE) ×3 IMPLANT
TRAP SPECIMEN MUCUS 40CC (MISCELLANEOUS) IMPLANT
TUBE CONNECTING 12X1/4 (SUCTIONS) ×2 IMPLANT
WATER STERILE IRR 1000ML POUR (IV SOLUTION) ×2 IMPLANT
YANKAUER SUCT BULB TIP NO VENT (SUCTIONS) ×1 IMPLANT

## 2022-01-25 NOTE — Brief Op Note (Signed)
Unable to removed food impaction from cricopharyngeus with standard endoscope (see endopro note for details). ENT called (Dr. Marcelline Deist) to remove food bolus with rigid scope, which was not successful with [facility] equipment. Patient will be transferred to [facility] OR by Dr. Marcelline Deist via [facility] for management of her food impaction and presumed Zenker's Diverticulum. Family updated by Dr. Marcelline Deist. Dispo per ENT team.

## 2022-01-25 NOTE — H&P (View-Only) (Signed)
Referring Provider: Dr. Billy Fischer Primary Care Physician:  Cari Caraway, MD Primary Gastroenterologist:  Dr. Michail Sermon  Reason for Consultation:  Food Impaction  HPI: Christina Patton is a 76 y.o. female who after eating undercooked brussel sprouts felt like it was not going down right and started coughing last night and was unable to eat anything afterwards. Feels like something is stuck in her throat. Able to swallow liquids and saliva. History of Zenker's diverticulum with surgery years ago. Reports occasional dysphagia over the years after surgery until the past several years when she denies having sensation of food hanging up or trouble swallowing. Denies heartburn, melena, or hematochezia. CT shows impacted food in the proximal esophagus with significant dilation.  Past Medical History:  Diagnosis Date   Anxiety    Cancer ([place])    skin cancer on face   Osteoporosis    Zenker's diverticulum     Past Surgical History:  Procedure Laterality Date   ZENKER'S DIVERTICULECTOMY     operated/repaired    Prior to Admission medications   Medication Sig Start Date End Date Taking? Authorizing Provider  alendronate (FOSAMAX) 70 MG tablet Take 70 mg by mouth once a week. Take with a full glass of water on an empty stomach.   Yes [provider]  clonazePAM (KLONOPIN) 0.5 MG tablet Take 0.5 mg by mouth 2 (two) times daily as needed for anxiety.   Yes [provider]  augmented betamethasone dipropionate (DIPROLENE-AF) 0.05 % cream Apply topically 2 (two) times daily. 10/10/20   [provider]  escitalopram (LEXAPRO) 10 MG tablet Take 1 tablet by mouth daily. 07/27/16   [provider]  guaiFENesin (MUCINEX) 600 MG 12 hr tablet Take 600 mg by mouth 2 (two) times daily as needed. Patient not taking: Reported on 12/19/2020    [provider]  loratadine (CLARITIN) 10 MG tablet Take 10 mg by mouth daily. Patient not taking: Reported on 12/19/2020     [provider]  Multiple Vitamin (MULTIVITAMIN WITH MINERALS) TABS tablet Take 1 tablet by mouth daily.    [provider]  nabumetone (RELAFEN) 500 MG tablet Take 1 tablet (500 mg total) by mouth 2 (two) times daily as needed. 01/04/21   Hilts, Legrand Como, MD  polyethylene glycol (MIRALAX / GLYCOLAX) packet Take 17 g by mouth daily.    [provider]    Scheduled Meds: Continuous Infusions:  sodium chloride     PRN Meds:.  Allergies as of 01/25/2022 - Review Complete 01/25/2022  Allergen Reaction Noted   Glucosamine  10/17/2020   Oxycodone hcl  10/17/2020   Sulfa antibiotics  10/17/2020    Family History  Problem Relation Age of Onset   Ovarian cancer Mother    Breast cancer Neg Hx     Social History   Socioeconomic History   Marital status: Married    Spouse name: Not on file   Number of children: Not on file   Years of education: Not on file   Highest education level: Not on file  Occupational History   Not on file  Tobacco Use   Smoking status: Never   Smokeless tobacco: Never  Substance and Sexual Activity   Alcohol use: Never   Drug use: Not on file   Sexual activity: Not on file  Other Topics Concern   Not on file  Social History Narrative   Not on file   Social Determinants of Health   Financial Resource Strain: Not on file  Food  Insecurity: Not on file  Transportation Needs: Not on file  Physical Activity: Not on file  Stress: Not on file  Social Connections: Not on file  Intimate Partner Violence: Not on file    Review of Systems: All negative except as stated above in HPI.  Physical Exam: Vital signs: Vitals:   01/25/22 1745 01/25/22 1921  BP: (!) 163/76 (!) 179/82  Pulse: (!) 53 (!) 57  Resp: 18 14  Temp:    SpO2: 96% 97%     General:   Alert,  elderly, thin, pleasant and cooperative in NAD Head: normocephalic, atraumatic Eyes: anicteric sclera ENT: oropharynx clear Neck: supple, nontender Lungs:  Clear  throughout to auscultation.   No wheezes, crackles, or rhonchi. No acute distress. Heart:  Regular rate and rhythm; no murmurs, clicks, rubs,  or gallops. Abdomen: RLQ tenderness with guarding otherwise nontender, soft, nondistended, +BS  Rectal:  Deferred Ext: no edema  GI:  Lab Results: No results for input(s): "WBC", "HGB", "HCT", "PLT" in the last 72 hours. BMET No results for input(s): "NA", "K", "CL", "CO2", "GLUCOSE", "BUN", "CREATININE", "CALCIUM" in the last 72 hours. LFT No results for input(s): "PROT", "ALBUMIN", "AST", "ALT", "ALKPHOS", "BILITOT", "BILIDIR", "IBILI" in the last 72 hours. PT/INR No results for input(s): "LABPROT", "INR" in the last 72 hours.   Studies/Results: CT Soft Tissue Neck Wo Contrast  Result Date: 01/25/2022 CLINICAL DATA:  Patient states that she was eating brussel sprouts last night and felt something get stuck in her throat, CX-ray done showing probable FB. GI consult to proceed with CT scan. Hx of Zenkers Diverticulum EXAM: CT NECK WITHOUT CONTRAST TECHNIQUE: Multidetector CT imaging of the neck was performed following the standard protocol without intravenous contrast. RADIATION DOSE REDUCTION: This exam was performed according to the departmental dose-optimization program which includes automated exposure control, adjustment of the mA and/or kV according to patient size and/or use of iterative reconstruction technique. COMPARISON:  Soft tissue neck 01/25/2022 FINDINGS: Pharynx and larynx: No radiopaque foreign body. Normal pharynx. No mass lesion. Epiglottis and larynx normal. There is impacted food in the proximal esophagus which is significantly dilated. This accounts for the soft tissue thickening in the prevertebral space on the lateral soft tissue neck x-ray. No definite mass lesion identified. Salivary glands: No inflammation, mass, or stone. Thyroid: Negative Lymph nodes: No enlarged lymph nodes in the neck. Vascular: Limited vascular evaluation  without intravenous contrast. Limited intracranial: Negative Visualized orbits: Bilateral cataract extraction. No orbital lesion. Mastoids and visualized paranasal sinuses: Paranasal sinuses clear. Mastoid and middle ear clear. Skeleton: Degenerative change in the cervical spine. No acute abnormality. Upper chest: Lung apices clear bilaterally Other: None IMPRESSION: Impacted food in the dilated proximal esophagus. This could be due to stricture or tumor or diverticulum. GI consultation recommended. No radiopaque foreign body in the pharynx or esophagus. Electronically Signed   By: Franchot Gallo M.D.   On: 01/25/2022 17:21   DG Neck Soft Tissue  Result Date: 01/25/2022 CLINICAL DATA:  Globus sensation EXAM: NECK SOFT TISSUES - 1+ VIEW COMPARISON:  None Available. FINDINGS: Abnormal prevertebral soft tissue thickening at the C7 level measuring 2.6 cm in thickness. There is no evidence of epiglottic enlargement. The cervical airway is unremarkable and no radio-opaque foreign body identified. IMPRESSION: Abnormal prevertebral soft tissue thickening at the C7 level. Impacted food bolus within the proximal esophagus is not excluded given the history. Electronically Signed   By: Davina Poke D.O.   On: 01/25/2022 15:49    Impression/Plan:  Food impaction in the setting of history of Zenker's diverticulum repair years ago. Despite lack of drooling and still being able to swallow liquids she needs an EGD to further evaluate the CT findings. Will not plan to dilate today if it is needed due to the likely mucosal trauma from any impacted food.    LOS: 0 days   [name]  01/25/2022, 7:47 PM  Questions please call [phone]

## 2022-01-25 NOTE — Op Note (Signed)
[facility] Patient Name: Christina Patton Procedure Date: 01/25/2022 MRN: [id number] Attending MD: [name] , MD Date of Birth: 01-04-1946 CSN: [id number] Age: 76 Admit Type: Outpatient Procedure:                Upper GI endoscopy Indications:              Dysphagia, Foreign body in the esophagus Providers:                [name], MD, Benay Pillow, RN,                            Doristine Johns, RN, Benetta Spar, Technician Referring MD:             ER Medicines:                Propofol per Anesthesia, Monitored Anesthesia Care,                            Intubated prior to attempted food removal Complications:            No immediate complications. Estimated Blood Loss:     Estimated blood loss: none. Procedure:                Pre-Anesthesia Assessment:                           - Prior to the procedure, a History and Physical                            was performed, and patient medications and                            allergies were reviewed. The patient's tolerance of                            previous anesthesia was also reviewed. The risks                            and benefits of the procedure and the sedation                            options and risks were discussed with the patient.                            All questions were answered, and informed consent                            was obtained. Prior Anticoagulants: The patient has                            taken no previous anticoagulant or antiplatelet                            agents. ASA Grade Assessment: III - A patient with  severe systemic disease. After reviewing the risks                            and benefits, the patient was deemed in                            satisfactory condition to undergo the procedure.                           After obtaining informed consent, the endoscope was                            passed under direct  vision. Throughout the                            procedure, the patient's blood pressure, pulse, and                            oxygen saturations were monitored continuously. The                            [id number]-[id number] ([id number]) Olympus endoscope was introduced                            through the mouth, with the intention of advancing                            to the duodenum. The scope was advanced to the                            cricopharyngeal esophagus before the procedure was                            aborted. Medications were given. The upper GI                            endoscopy was extremely difficult due to presence                            of food. The patient tolerated the procedure well. Scope In: Scope Out: Findings:      Food was found at the cricopharyngeus. Attempted to remove the food       bolus with passes of the Talon grasper, rat tooth forceps and the Jabier Mutton       net but due to fine consistency of the food and the proximal nature of       the food and concern for a Zenker's diverticulum only able to remove       very small portions of the food and the majority of the bolus remained       lodged in the cricopharyngeus. Procedure terminated at this point. Impression:               - Food at the cricopharyngeus.                           -  No specimens collected.                           - Likely Zenker's Diverticulum causing the food                            impaction in the cricopharyngeus. Moderate Sedation:      N/A - MAC procedure Recommendation:           - ENT called to remove food with a rigid scope and                            then pt will need outpt f/u with ENT for repair of                            the Zenker's. Procedure Code(s):        --- Professional ---                           [phone], 52, Esophagogastroduodenoscopy, flexible,                            transoral; diagnostic, including collection of                             specimen(s) by brushing or washing, when performed                            (separate procedure) Diagnosis Code(s):        --- Professional ---                           M46.803O, Food in esophagus causing other injury,                            initial encounter                           R13.10, Dysphagia, unspecified                           T18.108A, Unspecified foreign body in esophagus                            causing other injury, initial encounter CPT copyright 2019 [employer] Association. All rights reserved. The codes documented in this report are preliminary and upon coder review may  be revised to meet current compliance requirements. [name], MD 01/25/2022 9:06:07 PM This report has been signed electronically. Number of Addenda: 0

## 2022-01-25 NOTE — ED Triage Notes (Signed)
Pt has history of Zinkers diverticulum, treated years ago with surgery and has had no issues. Pt swallowed brussell sprout last night and feels stuck.

## 2022-01-25 NOTE — Transfer of Care (Signed)
Immediate Anesthesia Transfer of Care Note  Patient: SHONDREA STEINERT  Procedure(s) Performed: ESOPHAGOSCOPY (Throat)  Patient Location: ICU  Anesthesia Type:General  Level of Consciousness: sedated, unresponsive and Patient remains intubated per anesthesia plan  Airway & Oxygen Therapy: Patient remains intubated per anesthesia plan and Patient placed on Ventilator (see vital sign flow sheet for setting) +BBS  Post-op Assessment: Report given to RN and Post -op Vital signs reviewed and stable  Post vital signs: Reviewed and stable  Last Vitals:  Vitals Value Taken Time  BP    Temp    Pulse 70 01/25/22 2338  Resp 17 01/25/22 2338  SpO2 100 % 01/25/22 2338  Vitals shown include unvalidated device data.  Last Pain:  Vitals:   01/25/22 1921  PainSc: 0-No pain         Complications: No notable events documented.

## 2022-01-25 NOTE — H&P (Signed)
Subjective:     Christina Patton is a 76 y.o. female who presents for evaluation of dysphagia. She has a history of a Zenker's diverticulum repair and was eating brussell sprouts earlier today when she developed a right sided globus sensation.  I was called and suggested that she have a barium swallow and follow up as an outpatient for a definitive Zenker's repair with removal of the food and debris.  After my recommendations, GI medicine was called and they elected to take her to the endoscopy suite for removal of the food bolus this evening.  After multiple attempts by GI medicine in the endoscopy suite, I was called for an intraprocedure consult.  They were not successful in removing the bolus.  I arrived and tried to perform a DL with the limited equipment available here at [facility].  I was not able to suspend her and no rigid esophagoscopes were available.  There were no rigid endoscopes available.  I suggested that she remain intubated due to a concern for aspiration of the debris with extubation and postop recovery.  Given the lack of equipment, she is being transferred to [facility] intubated for a second attempt now by me to remove the bolus.  I have spoken with her sister in law about the situation and discussed the risks of perforation, dental damage, and inability to remove the bolus.  The entire endoscopy team noted several areas of broken dentition prior to any instrumentation by me. I have discussed the transfer with anesthesia at [facility];  he would like her brought directly to the OR rather than going to an ICU first.  Patient History:  The following portions of the patient's history were reviewed and updated as appropriate: allergies, current medications, past family history, past medical history, past social history, past surgical history, and problem list.   Objective:    BP (!) 179/82   Pulse (!) 57   Temp 98.1 F (36.7 C)   Resp 14   Ht '5\' 4"'$  (1.626 m)   Wt 55.3 kg   SpO2  97%   BMI 20.94 kg/m   Intubated Damaged dentition noted lower incisors Face without trauma Chest symmetric Intubated  Assessment:   Dysphagia Foreign body - esophagus s/p unsuccessful removal by GI medicine   Plan:   To OR for [facility] with rigid cervical esophagoscopy.  Sister in law aware of plan and risks.  If unable to remove endoscopically, will likely leave intubated and consider external approach via lateral pharyngotomy.

## 2022-01-25 NOTE — Interval H&P Note (Signed)
History and Physical Interval Note:  01/25/2022 7:53 PM  Christina Patton  has presented today for surgery, with the diagnosis of food impaction.  The various methods of treatment have been discussed with the patient and family. After consideration of risks, benefits and other options for treatment, the patient has consented to  Procedure(s): ESOPHAGOGASTRODUODENOSCOPY (EGD) (N/A) as a surgical intervention.  The patient's history has been reviewed, patient examined, no change in status, stable for surgery.  I have reviewed the patient's chart and labs.  Questions were answered to the patient's satisfaction.     [name]

## 2022-01-25 NOTE — Anesthesia Procedure Notes (Signed)
Procedure Name: Intubation Date/Time: 01/25/2022 8:13 PM  Performed by: Gean Maidens, CRNAPre-anesthesia Checklist: Patient identified, Emergency Drugs available, Suction available, Patient being monitored and Timeout performed Patient Re-evaluated:Patient Re-evaluated prior to induction Oxygen Delivery Method: Circle system utilized Preoxygenation: Pre-oxygenation with 100% oxygen Induction Type: IV induction and Rapid sequence Laryngoscope Size: Glidescope and 4 Grade View: Grade I Tube type: Oral Tube size: 7.0 mm Number of attempts: 2 Airway Equipment and Method: Video-laryngoscopy Placement Confirmation: ETT inserted through vocal cords under direct vision, positive ETCO2 and breath sounds checked- equal and bilateral Secured at: 21 cm Tube secured with: Tape Dental Injury: Teeth and Oropharynx as per pre-operative assessment  Difficulty Due To: Difficulty was unanticipated and Difficult Airway- due to dentition Future Recommendations: Recommend- induction with short-acting agent, and alternative techniques readily available

## 2022-01-25 NOTE — Transfer of Care (Signed)
Immediate Anesthesia Transfer of Care Note  Patient: Christina Patton  Procedure(s) Performed: ESOPHAGOGASTRODUODENOSCOPY (EGD) FOREIGN BODY REMOVAL  Patient Location: Pt moved to [facility] via [facility].  Anesthesia Type:General  Level of Consciousness: Patient remains intubated per anesthesia plan  Airway & Oxygen Therapy: Patient remains intubated per anesthesia plan and Patient placed on Ventilator (see vital sign flow sheet for setting)  Post-op Assessment: Post -op Vital signs reviewed and stable  Post vital signs: Reviewed and stable  Last Vitals:  Vitals Value Taken Time  BP    Temp    Pulse    Resp    SpO2      Last Pain:  Vitals:   01/25/22 1921  PainSc: 0-No pain         Complications:  Encounter Notable Events  Notable Event Outcome Phase Comment  Difficult to intubate - unexpected  Intraprocedure Filed from anesthesia note documentation.

## 2022-01-25 NOTE — ED Provider Notes (Signed)
[place] EMERGENCY DEPT Provider Note   CSN: [id number] Arrival date & time: 01/25/22  1317     History  Chief Complaint  Patient presents with   Dysphagia    Christina Patton is a 76 y.o. female.  HPI     Had hx of prior zenker's diverticulum, had surgery no problems for years Last night was eating brussel sprouts undercooked around 6PM. Felt like something got caught Solids won't go down Is not drooling No throat pain, no dyspnea Feels like there is lump anterior neck Since last night around 6PM Thought could cough and it would go away but it is the same as it was last night  [facility] Does not have ENT  Past Medical History:  Diagnosis Date   Anxiety    Cancer ([place])    skin cancer on face   Osteoporosis    Zenker's diverticulum     Home Medications Prior to Admission medications   Medication Sig Start Date End Date Taking? Authorizing Provider  alendronate (FOSAMAX) 70 MG tablet Take 70 mg by mouth once a week. Take with a full glass of water on an empty stomach.   Yes [provider]  clonazePAM (KLONOPIN) 0.5 MG tablet Take 0.5 mg by mouth 2 (two) times daily as needed for anxiety.   Yes [provider]  augmented betamethasone dipropionate (DIPROLENE-AF) 0.05 % cream Apply topically 2 (two) times daily. 10/10/20   [provider]  escitalopram (LEXAPRO) 10 MG tablet Take 1 tablet by mouth daily. 07/27/16   [provider]  guaiFENesin (MUCINEX) 600 MG 12 hr tablet Take 600 mg by mouth 2 (two) times daily as needed. Patient not taking: Reported on 12/19/2020    [provider]  loratadine (CLARITIN) 10 MG tablet Take 10 mg by mouth daily. Patient not taking: Reported on 12/19/2020    [provider]  Multiple Vitamin (MULTIVITAMIN WITH MINERALS) TABS tablet Take 1 tablet by mouth daily.    [provider]  nabumetone (RELAFEN) 500 MG tablet Take 1 tablet (500 mg total) by mouth 2 (two)  times daily as needed. 01/04/21   Hilts, Legrand Como, MD  polyethylene glycol (MIRALAX / GLYCOLAX) packet Take 17 g by mouth daily.    [provider]      Allergies    Glucosamine, Oxycodone hcl, and Sulfa antibiotics    Review of Systems   Review of Systems  Physical Exam Updated Vital Signs BP 134/75 (BP Location: Right Arm)   Pulse 77   Temp (!) 96.6 F (35.9 C) (Axillary)   Resp 20   Ht '5\' 4"'$  (1.626 m)   Wt 54.1 kg   SpO2 98%   BMI 20.47 kg/m  Physical Exam Vitals and nursing note reviewed.  Constitutional:      General: She is not in acute distress.    Appearance: She is well-developed. She is not diaphoretic.  HENT:     Head: Normocephalic and atraumatic.  Eyes:     Conjunctiva/sclera: Conjunctivae normal.  Cardiovascular:     Rate and Rhythm: Normal rate and regular rhythm.     Heart sounds: Normal heart sounds. No murmur heard.    No friction rub. No gallop.  Pulmonary:     Effort: Pulmonary effort is normal. No respiratory distress.     Breath sounds: Normal breath sounds. No wheezing or rales.  Abdominal:     General: There is no distension.     Palpations: Abdomen is soft.  Tenderness: There is no abdominal tenderness. There is no guarding.  Musculoskeletal:        General: No tenderness.     Cervical back: Normal range of motion.  Skin:    General: Skin is warm and dry.     Findings: No erythema or rash.  Neurological:     Mental Status: She is alert and oriented to person, place, and time.     ED Results / Procedures / Treatments   Labs (all labs ordered are listed, but only abnormal results are displayed) Labs Reviewed  CBC WITH DIFFERENTIAL/PLATELET - Abnormal; Notable for the following components:      Result Value   WBC 14.4 (*)    Hemoglobin 16.0 (*)    Neutro Abs 13.0 (*)    All other components within normal limits  COMPREHENSIVE METABOLIC PANEL - Abnormal; Notable for the following components:   Potassium 3.4 (*)    Glucose,  Bld 132 (*)    All other components within normal limits  GLUCOSE, CAPILLARY - Abnormal; Notable for the following components:   Glucose-Capillary 100 (*)    All other components within normal limits  BASIC METABOLIC PANEL - Abnormal; Notable for the following components:   Sodium 134 (*)    CO2 20 (*)    Glucose, Bld 187 (*)    All other components within normal limits  GLUCOSE, CAPILLARY - Abnormal; Notable for the following components:   Glucose-Capillary 206 (*)    All other components within normal limits  POCT I-STAT 7, (LYTES, BLD GAS, ICA,H+H) - Abnormal; Notable for the following components:   pH, Arterial 7.485 (*)    pO2, Arterial 178 (*)    Acid-Base Excess 3.0 (*)    Potassium 3.2 (*)    Hemoglobin 15.6 (*)    All other components within normal limits  MRSA NEXT GEN BY PCR, NASAL  PROTIME-INR  BLOOD GAS, ARTERIAL    EKG None  Radiology CT SOFT TISSUE NECK WO CONTRAST  Result Date: 01/26/2022 CLINICAL DATA:  Initial evaluation for esophageal diverticulum with food impaction, status post attempt at removal/decompression. EXAM: CT NECK WITHOUT CONTRAST TECHNIQUE: Multidetector CT imaging of the neck was performed following the standard protocol without intravenous contrast. RADIATION DOSE REDUCTION: This exam was performed according to the departmental dose-optimization program which includes automated exposure control, adjustment of the mA and/or kV according to patient size and/or use of iterative reconstruction technique. COMPARISON:  CT from 01/25/2022. FINDINGS: Pharynx and larynx: Endotracheal tube in place. Tip is somewhat low lying at the level of the carina projecting towards the right mainstem bronchus. Retraction by proximally 2 cm suggested to avoid inferior slippage. The supraglottic larynx and glottis is largely collapsed around the 2 and not well assessed. Previously seen impacted bolus at the level of the proximal esophagus is now decreased in size, although a  smaller bolus remains measuring approximately 1.9 x 2.5 cm (series 3, image 77). This is favored to reflect impacted food within an esophageal diverticulum, although again an obstructive stricture or mass is not excluded. Visualized esophagus is decompressed distally. No pneumomediastinum to suggest perforation or other complication status post intervention. No radiopaque foreign body. Salivary glands: Parotid and submandibular glands are within normal limits. Thyroid: Negative. Lymph nodes: No enlarged or concerning adenopathy within the neck. Vascular: Not well assessed due to lack of IV contrast. No significant atheromatous disease. Limited intracranial: Unremarkable. Visualized orbits: Prior bilateral ocular lens replacement. Otherwise unremarkable. Mastoids and visualized paranasal sinuses: Mild mucoperiosteal  thickening about the ethmoidal air cells. Paranasal sinuses are otherwise largely clear. Visualized mastoid no discrete or worrisome osseous lesions. Air cells and middle ear cavities are well pneumatized and free of fluid. Skeleton: No discrete or worrisome osseous lesions. Upper chest: Mild dependent atelectatic changes noted within the visualized lungs. Visualized lungs are otherwise clear. No pneumothorax. Other: None. IMPRESSION: 1. Previously seen impacted food bolus at the level of the proximal esophagus is decreased in size, although a smaller bolus remains. This is favored to reflect impacted food within an esophageal diverticulum, although again an obstructive stricture or mass is not excluded. No evidence for perforation or other complication status post intervention. 2. Endotracheal tube in place with tip somewhat low lying at the level of the carina projecting towards the right mainstem bronchus. Retraction by 2 cm suggested to avoid slippage into the mainstem bronchus. Electronically Signed   By: Jeannine Boga M.D.   On: 01/26/2022 01:16   DG CHEST PORT 1 VIEW  Result Date:  01/26/2022 CLINICAL DATA:  Intubated EXAM: PORTABLE CHEST 1 VIEW COMPARISON:  09/12/2016 FINDINGS: Endotracheal tube tip is about 3 cm superior to the carina. Curvilinear opacity at the left lung base suggestive of scarring. Right lung is clear. Normal cardiac size. No pneumothorax. IMPRESSION: 1. Endotracheal tube tip about 3 cm superior to carina 2. Curvilinear opacity in the left lower lung suggestive of scarring Electronically Signed   By: Donavan Foil M.D.   On: 01/26/2022 00:03   CT Soft Tissue Neck Wo Contrast  Result Date: 01/25/2022 CLINICAL DATA:  Patient states that she was eating brussel sprouts last night and felt something get stuck in her throat, CX-ray done showing probable FB. GI consult to proceed with CT scan. Hx of Zenkers Diverticulum EXAM: CT NECK WITHOUT CONTRAST TECHNIQUE: Multidetector CT imaging of the neck was performed following the standard protocol without intravenous contrast. RADIATION DOSE REDUCTION: This exam was performed according to the departmental dose-optimization program which includes automated exposure control, adjustment of the mA and/or kV according to patient size and/or use of iterative reconstruction technique. COMPARISON:  Soft tissue neck 01/25/2022 FINDINGS: Pharynx and larynx: No radiopaque foreign body. Normal pharynx. No mass lesion. Epiglottis and larynx normal. There is impacted food in the proximal esophagus which is significantly dilated. This accounts for the soft tissue thickening in the prevertebral space on the lateral soft tissue neck x-ray. No definite mass lesion identified. Salivary glands: No inflammation, mass, or stone. Thyroid: Negative Lymph nodes: No enlarged lymph nodes in the neck. Vascular: Limited vascular evaluation without intravenous contrast. Limited intracranial: Negative Visualized orbits: Bilateral cataract extraction. No orbital lesion. Mastoids and visualized paranasal sinuses: Paranasal sinuses clear. Mastoid and middle ear  clear. Skeleton: Degenerative change in the cervical spine. No acute abnormality. Upper chest: Lung apices clear bilaterally Other: None IMPRESSION: Impacted food in the dilated proximal esophagus. This could be due to stricture or tumor or diverticulum. GI consultation recommended. No radiopaque foreign body in the pharynx or esophagus. Electronically Signed   By: Franchot Gallo M.D.   On: 01/25/2022 17:21   DG Neck Soft Tissue  Result Date: 01/25/2022 CLINICAL DATA:  Globus sensation EXAM: NECK SOFT TISSUES - 1+ VIEW COMPARISON:  None Available. FINDINGS: Abnormal prevertebral soft tissue thickening at the C7 level measuring 2.6 cm in thickness. There is no evidence of epiglottic enlargement. The cervical airway is unremarkable and no radio-opaque foreign body identified. IMPRESSION: Abnormal prevertebral soft tissue thickening at the C7 level. Impacted food bolus within  the proximal esophagus is not excluded given the history. Electronically Signed   By: Davina Poke D.O.   On: 01/25/2022 15:49    Procedures Procedures    Medications Ordered in ED Medications  Chlorhexidine Gluconate Cloth 2 % PADS 6 each (6 each Topical Given 01/25/22 2330)  docusate (COLACE) 50 MG/5ML liquid 100 mg (has no administration in time range)  polyethylene glycol (MIRALAX / GLYCOLAX) packet 17 g (has no administration in time range)  dextrose 5 %-0.45 % sodium chloride infusion ( Intravenous Infusion Verify 01/26/22 0900)  ondansetron (ZOFRAN) injection 4 mg (has no administration in time range)  fentaNYL 2568mg in NS 2555m(1053mml) infusion-PREMIX (50 mcg/hr Intravenous New Bag/Given 01/26/22 0752)  Oral care mouth rinse (15 mLs Mouth Rinse Given 01/26/22 0949)  Oral care mouth rinse (has no administration in time range)  hydrALAZINE (APRESOLINE) injection 10 mg (10 mg Intravenous Given 01/26/22 0639)  propofol (DIPRIVAN) 1000 MG/100ML infusion (20 mcg/kg/min  55.3 kg Intravenous Infusion Verify 01/26/22 0900)   0.9 %  sodium chloride infusion ( Intravenous New Bag/Given 01/26/22 0534)  pantoprazole (PROTONIX) injection 40 mg (40 mg Intravenous Given 01/26/22 0949)  midazolam (VERSED) injection 1 mg (has no administration in time range)  potassium chloride 10 mEq in 100 mL IVPB (10 mEq Intravenous New Bag/Given 01/26/22 [id number]  ED Course/ Medical Decision Making/ A&P                           Medical Decision Making Amount and/or Complexity of Data Reviewed Radiology: ordered.   [id number] with history of prior zenker's diverticulum with surgical removal and no problems for many years presents with difficulty swallowing after eating brussel sprouts last night.    Contacted GI and ENT regarding symptoms.  Discussed with Dr. CalMarcelline Deistho recommends outpatient ENT follow up with concern for recurrent Zenker's Diverticulum and gave number for me to contact to arrange outpatient follow up however did not receive call regarding this.  Discussed with Dr. BraRosalie Gums conversation with Dr. CalMarcelline Deist well as the patient's presentation and reviewed XR which shows possible FB at level of C7.  Discussed difficulty knowing if this is related to Zenker's Diverticulum or other esophageal food impaction.  Recommended CT neck which showed proximal esophageal food impaction. Contacted Dr. SchMichail Sermon regarding these findings who recommends [facility].  Discussed with Dr. PfiColvin Carolid [facility]         Final Clinical Impression(s) / ED Diagnoses Final diagnoses:  Esophageal obstruction due to food impaction    Rx / DC Orders ED Discharge Orders     None         SchGareth MorganD 01/26/22 1042

## 2022-01-25 NOTE — ED Notes (Signed)
Report given to [facility] ED charge RN.

## 2022-01-25 NOTE — H&P (Signed)
NAME:  NEELAH MANNINGS, MRN:  [id number], DOB:  [birth year], LOS: 0 ADMISSION DATE:  01/25/2022, CONSULTATION DATE:  01/25/22 REFERRING MD:  Marcelline Deist, CHIEF COMPLAINT:  impacted food bolus in esophagus  History of Present Illness:  Ms Riggle is a 76 yo woman with a hx of Zenker's diverticulum (surgery years ago), presented today with feeling of food stuck in her throat from 6/28 pm after eating undercooked brussels sprouts.  Able to pass liquids and saliva but unable to eat.   Hx of dysphagia for years, resolved after surgery several years ago.   GI (Dr. Michail Sermon) attempted to remove food.   Per their op note: food found at cricopharyngeus.  Unable to remove substantial portion of food.   ENT called intraoperatively at [facility], no rigid endoscopes actually available however.  Tx to [facility] cone for second attempt, this time with ridid scope.   "Damaged dentition noted lower incisors " from ENT note.    Prior to Arrival in ICU: received 1L LR     Pertinent  Medical History  Anxiety  Hx of skin cancer (face) Osteoporosis S/p zenkers diverticulectomy   Meds: fosamax, klonopin, lexapro, claritin, nabumetone, miralax, MVI  Significant Hospital Events: Including procedures, antibiotic start and stop dates in addition to other pertinent events     Interim History / Subjective:    Objective   Blood pressure (!) 179/82, pulse (!) 57, temperature 98.1 F (36.7 C), resp. rate 14, height '5\' 4"'$  (1.626 m), weight 55.3 kg, SpO2 97 %.        Intake/Output Summary (Last 24 hours) at 01/25/2022 2307 Last data filed at 01/25/2022 2148 Gross per 24 hour  Intake 1000 ml  Output --  Net 1000 ml   Filed Weights   01/25/22 1921  Weight: 55.3 kg    Examination: General: intubated, sedated  HENT: tongue appears slightly/moderately swollen  Lungs: CTAB Cardiovascular: rrr no mgr  Abdomen: distention in lower abdomen, nbs Extremities: no edema  Neuro: sedated, non responsive PERRL GU:  purewick   CT soft tissue, neck w/o contrast; IMPRESSION: Impacted food in the dilated proximal esophagus. This could be due to stricture or tumor or diverticulum. GI consultation recommended.   No radiopaque foreign body in the pharynx or esophagus. Resolved Hospital Problem list     Assessment & Plan:  Impacted food bolus in esophagus, hx of zenkers diverticula. Attempts to remove by GI and ENT.  Further management of food bolus by ENT.  Remain intubated tonight for airway protection after manipulation of airway, and more importantly plan for additional possible intervention soon.  CT of neck to reeval position of bolus is pending.   NO NG tube, maint IVF for now.   Mech vent for airway protection.  Cont sedation and pain control as needed.   ABG pending.   Lower abdominal distention: check bladder scan, foley if retaining.    Anxiety  Hx of skin cancer (face) Osteoporosis S/p zenkers diverticulectomy   Best Practice (right click and "Reselect all SmartList Selections" daily)   Diet/type: NPO DVT prophylaxis: not indicated holding for now.   GI prophylaxis: PPI Lines: N/A Foley:  N/A Code Status:  full code Last date of multidisciplinary goals of care discussion '[ ]'$   Labs   CBC: No results for input(s): "WBC", "NEUTROABS", "HGB", "HCT", "MCV", "PLT" in the last 168 hours.  Basic Metabolic Panel: No results for input(s): "NA", "K", "CL", "CO2", "GLUCOSE", "BUN", "CREATININE", "CALCIUM", "MG", "PHOS" in the last 168 hours. GFR:  CrCl cannot be calculated (No successful lab value found.). No results for input(s): "PROCALCITON", "WBC", "LATICACIDVEN" in the last 168 hours.  Liver Function Tests: No results for input(s): "AST", "ALT", "ALKPHOS", "BILITOT", "PROT", "ALBUMIN" in the last 168 hours. No results for input(s): "LIPASE", "AMYLASE" in the last 168 hours. No results for input(s): "AMMONIA" in the last 168 hours.  ABG No results found for: "PHART", "PCO2ART",  "PO2ART", "HCO3", "TCO2", "ACIDBASEDEF", "O2SAT"   Coagulation Profile: No results for input(s): "INR", "PROTIME" in the last 168 hours.  Cardiac Enzymes: No results for input(s): "CKTOTAL", "CKMB", "CKMBINDEX", "TROPONINI" in the last 168 hours.  HbA1C: No results found for: "HGBA1C"  CBG: No results for input(s): "GLUCAP" in the last 168 hours.  Review of Systems:   Unable to assess.    Past Medical History:  She,  has a past medical history of Anxiety, Cancer ([place]), Osteoporosis, and Zenker's diverticulum.   Surgical History:   Past Surgical History:  Procedure Laterality Date   ZENKER'S DIVERTICULECTOMY     operated/repaired     Social History:   reports that she has never smoked. She has never used smokeless tobacco. She reports that she does not drink alcohol.   Family History:  Her family history includes Ovarian cancer in her mother. There is no history of Breast cancer.   Allergies Allergies  Allergen Reactions   Glucosamine     Other reaction(s): stomach upset   Oxycodone Hcl     Other reaction(s): nausea   Sulfa Antibiotics     Other reaction(s): childhood allergy     Home Medications  Prior to Admission medications   Medication Sig Start Date End Date Taking? Authorizing Provider  alendronate (FOSAMAX) 70 MG tablet Take 70 mg by mouth once a week. Take with a full glass of water on an empty stomach.   Yes [provider]  clonazePAM (KLONOPIN) 0.5 MG tablet Take 0.5 mg by mouth 2 (two) times daily as needed for anxiety.   Yes [provider]  augmented betamethasone dipropionate (DIPROLENE-AF) 0.05 % cream Apply topically 2 (two) times daily. 10/10/20   [provider]  escitalopram (LEXAPRO) 10 MG tablet Take 1 tablet by mouth daily. 07/27/16   [provider]  guaiFENesin (MUCINEX) 600 MG 12 hr tablet Take 600 mg by mouth 2 (two) times daily as needed. Patient not taking: Reported on 12/19/2020    [provider]  loratadine (CLARITIN) 10 MG tablet Take 10 mg by mouth daily. Patient not taking: Reported on 12/19/2020    [provider]  Multiple Vitamin (MULTIVITAMIN WITH MINERALS) TABS tablet Take 1 tablet by mouth daily.    [provider]  nabumetone (RELAFEN) 500 MG tablet Take 1 tablet (500 mg total) by mouth 2 (two) times daily as needed. 01/04/21   Hilts, Legrand Como, MD  polyethylene glycol (MIRALAX / GLYCOLAX) packet Take 17 g by mouth daily.    [provider]     Critical care time: 45 min

## 2022-01-25 NOTE — Anesthesia Preprocedure Evaluation (Addendum)
Anesthesia Evaluation  Patient identified by MRN, date of birth, ID band Patient awake    Reviewed: Allergy & Precautions, NPO status , Patient's Chart, lab work & pertinent test results  Airway Mallampati: II  TM Distance: >3 FB Neck ROM: Full    Dental  (+) Dental Advisory Given, Chipped   Pulmonary neg pulmonary ROS,    Pulmonary exam normal breath sounds clear to auscultation       Cardiovascular negative cardio ROS Normal cardiovascular exam Rhythm:Regular Rate:Normal     Neuro/Psych PSYCHIATRIC DISORDERS Anxiety negative neurological ROS     GI/Hepatic Neg liver ROS, Zenker's diverticulum food impaction   Endo/Other  negative endocrine ROS  Renal/GU negative Renal ROS     Musculoskeletal negative musculoskeletal ROS (+)   Abdominal   Peds  Hematology negative hematology ROS (+)   Anesthesia Other Findings Day of surgery medications reviewed with the patient.  Reproductive/Obstetrics                            Anesthesia Physical Anesthesia Plan  ASA: 2 and emergent  Anesthesia Plan: MAC   Post-op Pain Management: Minimal or no pain anticipated   Induction: Intravenous  PONV Risk Score and Plan: 2 and TIVA and Treatment may vary due to age or medical condition  Airway Management Planned: Nasal Cannula and Natural Airway  Additional Equipment:   Intra-op Plan:   Post-operative Plan:   Informed Consent: I have reviewed the patients History and Physical, chart, labs and discussed the procedure including the risks, benefits and alternatives for the proposed anesthesia with the patient or authorized representative who has indicated his/her understanding and acceptance.     Dental advisory given  Plan Discussed with: CRNA  Anesthesia Plan Comments:        Anesthesia Quick Evaluation

## 2022-01-25 NOTE — Anesthesia Preprocedure Evaluation (Signed)
Anesthesia Evaluation  Patient identified by MRN, date of birth, ID band Patient awake    Reviewed: Allergy & Precautions, NPO status , Patient's Chart, lab work & pertinent test results  Airway Mallampati: Intubated  TM Distance: >3 FB Neck ROM: Full    Dental  (+) Dental Advisory Given, Chipped   Pulmonary neg pulmonary ROS,    Pulmonary exam normal breath sounds clear to auscultation       Cardiovascular negative cardio ROS Normal cardiovascular exam Rhythm:Regular Rate:Normal     Neuro/Psych PSYCHIATRIC DISORDERS Anxiety negative neurological ROS     GI/Hepatic Neg liver ROS, Zenker's diverticulum food impaction   Endo/Other  negative endocrine ROS  Renal/GU negative Renal ROS     Musculoskeletal negative musculoskeletal ROS (+)   Abdominal   Peds  Hematology negative hematology ROS (+)   Anesthesia Other Findings Day of surgery medications reviewed with the patient.  Reproductive/Obstetrics                             Anesthesia Physical  Anesthesia Plan  ASA: 2 and emergent  Anesthesia Plan: General   Post-op Pain Management: Minimal or no pain anticipated   Induction: Intravenous  PONV Risk Score and Plan: 2 and TIVA and Treatment may vary due to age or medical condition  Airway Management Planned: Nasal Cannula and Natural Airway  Additional Equipment:   Intra-op Plan:   Post-operative Plan: Possible Post-op intubation/ventilation and Extubation in OR  Informed Consent: I have reviewed the patients History and Physical, chart, labs and discussed the procedure including the risks, benefits and alternatives for the proposed anesthesia with the patient or authorized representative who has indicated his/her understanding and acceptance.     Dental advisory given  Plan Discussed with: CRNA  Anesthesia Plan Comments: (Pt transferred from [place] after ENT unable to  visualize or intervene on impacted food 2/2 lack of equipment. Pt transported on ventilator to [facility] OR for further intervention. If successful plan to extubate post procedure. If not will leave intubated until tomorrow for definitive intervention per ENT.)        Anesthesia Quick Evaluation

## 2022-01-25 NOTE — Op Note (Signed)
Procedure(s): ESOPHAGOSCOPY Procedure Note  HARMAN FERRIN female 76 y.o. 01/25/2022  Procedure(s) and Anesthesia Type:    * ESOPHAGOSCOPY - General       Direct laryngoscopy  Surgeon(s) and Role:    * Aryah Doering, Alvy Beal., MD - Primary     Surgeon: Boyce Medici.   Procedure Detail  Findings: Patient arrived intubated to the operating room.  She was transferred to the table in the supine position.  Her eyes had been taped and recovered in eye pads.  A head wrap was placed.  I first attempted to suspend the larynx.  I was able to see the tube in the posterior glottis.  Attempts made at passing an NG tube into the esophagus were initially unsuccessful.  The rigid laryngoscope was removed and the small pediatric rigid esophagoscope was lubricated and inserted into the hypopharynx.  The teeth were protected using a dental guard.  The patient had a very anterior larynx, and I was not successful at successfully visualizing the esophagus.  The anesthesiologist held the glide scope which did provide guidance allowing me to attempt to pass the rigid scope into the esophagus.  Unfortunately due to the angle I was not able to pass the scope and did not want to risk perforation due to undue force.  The scope was withdrawn.  At this point the NG tube was passed multiple times and I was able to break up pieces of food and suction those into the tube.  My hope is that the bolus was disrupted enough that it has now passed into the stomach.  Everything was removed from the mouth and the patient remained intubated.  We will plan on repeating her CT scan and see if the bolus is passed.        Complications:  * No complications entered in OR log *         Disposition: ICU - intubated and hemodynamically stable.         Condition: stable

## 2022-01-25 NOTE — Consult Note (Signed)
Referring Provider: Dr. Billy Fischer Primary Care Physician:  Cari Caraway, MD Primary Gastroenterologist:  Dr. Michail Sermon  Reason for Consultation:  Food Impaction  HPI: Christina Patton is a 76 y.o. female who after eating undercooked brussel sprouts felt like it was not going down right and started coughing last night and was unable to eat anything afterwards. Feels like something is stuck in her throat. Able to swallow liquids and saliva. History of Zenker's diverticulum with surgery years ago. Reports occasional dysphagia over the years after surgery until the past several years when she denies having sensation of food hanging up or trouble swallowing. Denies heartburn, melena, or hematochezia. CT shows impacted food in the proximal esophagus with significant dilation.  Past Medical History:  Diagnosis Date   Anxiety    Cancer ([place])    skin cancer on face   Osteoporosis    Zenker's diverticulum     Past Surgical History:  Procedure Laterality Date   ZENKER'S DIVERTICULECTOMY     operated/repaired    Prior to Admission medications   Medication Sig Start Date End Date Taking? Authorizing Provider  alendronate (FOSAMAX) 70 MG tablet Take 70 mg by mouth once a week. Take with a full glass of water on an empty stomach.   Yes [provider]  clonazePAM (KLONOPIN) 0.5 MG tablet Take 0.5 mg by mouth 2 (two) times daily as needed for anxiety.   Yes [provider]  augmented betamethasone dipropionate (DIPROLENE-AF) 0.05 % cream Apply topically 2 (two) times daily. 10/10/20   [provider]  escitalopram (LEXAPRO) 10 MG tablet Take 1 tablet by mouth daily. 07/27/16   [provider]  guaiFENesin (MUCINEX) 600 MG 12 hr tablet Take 600 mg by mouth 2 (two) times daily as needed. Patient not taking: Reported on 12/19/2020    [provider]  loratadine (CLARITIN) 10 MG tablet Take 10 mg by mouth daily. Patient not taking: Reported on 12/19/2020     [provider]  Multiple Vitamin (MULTIVITAMIN WITH MINERALS) TABS tablet Take 1 tablet by mouth daily.    [provider]  nabumetone (RELAFEN) 500 MG tablet Take 1 tablet (500 mg total) by mouth 2 (two) times daily as needed. 01/04/21   Hilts, Legrand Como, MD  polyethylene glycol (MIRALAX / GLYCOLAX) packet Take 17 g by mouth daily.    [provider]    Scheduled Meds: Continuous Infusions:  sodium chloride     PRN Meds:.  Allergies as of 01/25/2022 - Review Complete 01/25/2022  Allergen Reaction Noted   Glucosamine  10/17/2020   Oxycodone hcl  10/17/2020   Sulfa antibiotics  10/17/2020    Family History  Problem Relation Age of Onset   Ovarian cancer Mother    Breast cancer Neg Hx     Social History   Socioeconomic History   Marital status: Married    Spouse name: Not on file   Number of children: Not on file   Years of education: Not on file   Highest education level: Not on file  Occupational History   Not on file  Tobacco Use   Smoking status: Never   Smokeless tobacco: Never  Substance and Sexual Activity   Alcohol use: Never   Drug use: Not on file   Sexual activity: Not on file  Other Topics Concern   Not on file  Social History Narrative   Not on file   Social Determinants of Health   Financial Resource Strain: Not on file  Food  Insecurity: Not on file  Transportation Needs: Not on file  Physical Activity: Not on file  Stress: Not on file  Social Connections: Not on file  Intimate Partner Violence: Not on file    Review of Systems: All negative except as stated above in HPI.  Physical Exam: Vital signs: Vitals:   01/25/22 1745 01/25/22 1921  BP: (!) 163/76 (!) 179/82  Pulse: (!) 53 (!) 57  Resp: 18 14  Temp:    SpO2: 96% 97%     General:   Alert,  elderly, thin, pleasant and cooperative in NAD Head: normocephalic, atraumatic Eyes: anicteric sclera ENT: oropharynx clear Neck: supple, nontender Lungs:  Clear  throughout to auscultation.   No wheezes, crackles, or rhonchi. No acute distress. Heart:  Regular rate and rhythm; no murmurs, clicks, rubs,  or gallops. Abdomen: RLQ tenderness with guarding otherwise nontender, soft, nondistended, +BS  Rectal:  Deferred Ext: no edema  GI:  Lab Results: No results for input(s): "WBC", "HGB", "HCT", "PLT" in the last 72 hours. BMET No results for input(s): "NA", "K", "CL", "CO2", "GLUCOSE", "BUN", "CREATININE", "CALCIUM" in the last 72 hours. LFT No results for input(s): "PROT", "ALBUMIN", "AST", "ALT", "ALKPHOS", "BILITOT", "BILIDIR", "IBILI" in the last 72 hours. PT/INR No results for input(s): "LABPROT", "INR" in the last 72 hours.   Studies/Results: CT Soft Tissue Neck Wo Contrast  Result Date: 01/25/2022 CLINICAL DATA:  Patient states that she was eating brussel sprouts last night and felt something get stuck in her throat, CX-ray done showing probable FB. GI consult to proceed with CT scan. Hx of Zenkers Diverticulum EXAM: CT NECK WITHOUT CONTRAST TECHNIQUE: Multidetector CT imaging of the neck was performed following the standard protocol without intravenous contrast. RADIATION DOSE REDUCTION: This exam was performed according to the departmental dose-optimization program which includes automated exposure control, adjustment of the mA and/or kV according to patient size and/or use of iterative reconstruction technique. COMPARISON:  Soft tissue neck 01/25/2022 FINDINGS: Pharynx and larynx: No radiopaque foreign body. Normal pharynx. No mass lesion. Epiglottis and larynx normal. There is impacted food in the proximal esophagus which is significantly dilated. This accounts for the soft tissue thickening in the prevertebral space on the lateral soft tissue neck x-ray. No definite mass lesion identified. Salivary glands: No inflammation, mass, or stone. Thyroid: Negative Lymph nodes: No enlarged lymph nodes in the neck. Vascular: Limited vascular evaluation  without intravenous contrast. Limited intracranial: Negative Visualized orbits: Bilateral cataract extraction. No orbital lesion. Mastoids and visualized paranasal sinuses: Paranasal sinuses clear. Mastoid and middle ear clear. Skeleton: Degenerative change in the cervical spine. No acute abnormality. Upper chest: Lung apices clear bilaterally Other: None IMPRESSION: Impacted food in the dilated proximal esophagus. This could be due to stricture or tumor or diverticulum. GI consultation recommended. No radiopaque foreign body in the pharynx or esophagus. Electronically Signed   By: Franchot Gallo M.D.   On: 01/25/2022 17:21   DG Neck Soft Tissue  Result Date: 01/25/2022 CLINICAL DATA:  Globus sensation EXAM: NECK SOFT TISSUES - 1+ VIEW COMPARISON:  None Available. FINDINGS: Abnormal prevertebral soft tissue thickening at the C7 level measuring 2.6 cm in thickness. There is no evidence of epiglottic enlargement. The cervical airway is unremarkable and no radio-opaque foreign body identified. IMPRESSION: Abnormal prevertebral soft tissue thickening at the C7 level. Impacted food bolus within the proximal esophagus is not excluded given the history. Electronically Signed   By: Davina Poke D.O.   On: 01/25/2022 15:49    Impression/Plan:  Food impaction in the setting of history of Zenker's diverticulum repair years ago. Despite lack of drooling and still being able to swallow liquids she needs an EGD to further evaluate the CT findings. Will not plan to dilate today if it is needed due to the likely mucosal trauma from any impacted food.    LOS: 0 days   [name]  01/25/2022, 7:47 PM  Questions please call [phone]

## 2022-01-26 ENCOUNTER — Encounter (HOSPITAL_COMMUNITY): Payer: Self-pay | Admitting: Gastroenterology

## 2022-01-26 ENCOUNTER — Inpatient Hospital Stay (HOSPITAL_COMMUNITY): Payer: Medicare HMO

## 2022-01-26 DIAGNOSIS — T18128D Food in esophagus causing other injury, subsequent encounter: Secondary | ICD-10-CM

## 2022-01-26 LAB — POCT I-STAT 7, (LYTES, BLD GAS, ICA,H+H)
Acid-Base Excess: 3 mmol/L — ABNORMAL HIGH (ref 0.0–2.0)
Bicarbonate: 25.8 mmol/L (ref 20.0–28.0)
Calcium, Ion: 1.17 mmol/L (ref 1.15–1.40)
HCT: 46 % (ref 36.0–46.0)
Hemoglobin: 15.6 g/dL — ABNORMAL HIGH (ref 12.0–15.0)
O2 Saturation: 100 %
Patient temperature: 97.9
Potassium: 3.2 mmol/L — ABNORMAL LOW (ref 3.5–5.1)
Sodium: 137 mmol/L (ref 135–145)
TCO2: 27 mmol/L (ref 22–32)
pCO2 arterial: 34.2 mmHg (ref 32–48)
pH, Arterial: 7.485 — ABNORMAL HIGH (ref 7.35–7.45)
pO2, Arterial: 178 mmHg — ABNORMAL HIGH (ref 83–108)

## 2022-01-26 LAB — COMPREHENSIVE METABOLIC PANEL
ALT: 17 U/L (ref 0–44)
AST: 28 U/L (ref 15–41)
Albumin: 4.1 g/dL (ref 3.5–5.0)
Alkaline Phosphatase: 60 U/L (ref 38–126)
Anion gap: 14 (ref 5–15)
BUN: 8 mg/dL (ref 8–23)
CO2: 23 mmol/L (ref 22–32)
Calcium: 9.6 mg/dL (ref 8.9–10.3)
Chloride: 102 mmol/L (ref 98–111)
Creatinine, Ser: 0.86 mg/dL (ref 0.44–1.00)
GFR, Estimated: >60 mL/min (ref >60)
Glucose, Bld: 132 mg/dL — ABNORMAL HIGH (ref 70–99)
Potassium: 3.4 mmol/L — ABNORMAL LOW (ref 3.5–5.1)
Sodium: 139 mmol/L (ref 135–145)
Total Bilirubin: 1.1 mg/dL (ref 0.3–1.2)
Total Protein: 7 g/dL (ref 6.5–8.1)

## 2022-01-26 LAB — CBC WITH DIFFERENTIAL/PLATELET
Abs Immature Granulocytes: 0.06 10*3/uL (ref 0.00–0.07)
Basophils Absolute: 0 10*3/uL (ref 0.0–0.1)
Basophils Relative: 0 %
Eosinophils Absolute: 0 10*3/uL (ref 0.0–0.5)
Eosinophils Relative: 0 %
HCT: 45.5 % (ref 36.0–46.0)
Hemoglobin: 16 g/dL — ABNORMAL HIGH (ref 12.0–15.0)
Immature Granulocytes: 0 %
Lymphocytes Relative: 6 %
Lymphs Abs: 0.8 10*3/uL (ref 0.7–4.0)
MCH: 32.3 pg (ref 26.0–34.0)
MCHC: 35.2 g/dL (ref 30.0–36.0)
MCV: 91.7 fL (ref 80.0–100.0)
Monocytes Absolute: 0.5 10*3/uL (ref 0.1–1.0)
Monocytes Relative: 3 %
Neutro Abs: 13 10*3/uL — ABNORMAL HIGH (ref 1.7–7.7)
Neutrophils Relative %: 91 %
Platelets: 196 10*3/uL (ref 150–400)
RBC: 4.96 MIL/uL (ref 3.87–5.11)
RDW: 11.7 % (ref 11.5–15.5)
WBC: 14.4 10*3/uL — ABNORMAL HIGH (ref 4.0–10.5)
nRBC: 0 % (ref 0.0–0.2)

## 2022-01-26 LAB — BASIC METABOLIC PANEL
Anion gap: 14 (ref 5–15)
BUN: 9 mg/dL (ref 8–23)
CO2: 20 mmol/L — ABNORMAL LOW (ref 22–32)
Calcium: 9.1 mg/dL (ref 8.9–10.3)
Chloride: 100 mmol/L (ref 98–111)
Creatinine, Ser: 0.8 mg/dL (ref 0.44–1.00)
GFR, Estimated: >60 mL/min (ref >60)
Glucose, Bld: 187 mg/dL — ABNORMAL HIGH (ref 70–99)
Potassium: 3.8 mmol/L (ref 3.5–5.1)
Sodium: 134 mmol/L — ABNORMAL LOW (ref 135–145)

## 2022-01-26 LAB — GLUCOSE, CAPILLARY
Glucose-Capillary: 206 mg/dL — ABNORMAL HIGH (ref 70–99)
Glucose-Capillary: 153 mg/dL — ABNORMAL HIGH (ref 70–99)
Glucose-Capillary: 133 mg/dL — ABNORMAL HIGH (ref 70–99)
Glucose-Capillary: 130 mg/dL — ABNORMAL HIGH (ref 70–99)
Glucose-Capillary: 115 mg/dL — ABNORMAL HIGH (ref 70–99)

## 2022-01-26 LAB — PROTIME-INR
INR: 1 (ref 0.8–1.2)
Prothrombin Time: 13.2 seconds (ref 11.4–15.2)

## 2022-01-26 LAB — MRSA NEXT GEN BY PCR, NASAL: MRSA by PCR Next Gen: NOT DETECTED

## 2022-01-26 MED ORDER — HYDRALAZINE HCL 20 MG/ML IJ SOLN
10.0000 mg | INTRAMUSCULAR | Status: DC | PRN
Start: 2022-01-26 — End: 2022-01-27
  Administered 2022-01-26: 10 mg via INTRAVENOUS
  Filled 2022-01-26: qty 1

## 2022-01-26 MED ORDER — MIDAZOLAM HCL 2 MG/2ML IJ SOLN
1.0000 mg | INTRAMUSCULAR | Status: DC | PRN
Start: 2022-01-26 — End: 2022-01-27

## 2022-01-26 MED ORDER — DEXTROSE IN LACTATED RINGERS 5 % IV SOLN: INTRAVENOUS | Status: AC

## 2022-01-26 MED ORDER — HYDRALAZINE HCL 20 MG/ML IJ SOLN: 10.0000 mg | INTRAMUSCULAR | Status: DC | PRN

## 2022-01-26 MED ORDER — SODIUM CHLORIDE 0.9 % IV SOLN: INTRAVENOUS | Status: DC | PRN

## 2022-01-26 MED ORDER — PANTOPRAZOLE SODIUM 40 MG IV SOLR
40.0000 mg | INTRAVENOUS | Status: DC
  Administered 2022-01-26 – 2022-01-27 (×2): 40 mg via INTRAVENOUS
  Filled 2022-01-26 (×2): qty 10

## 2022-01-26 MED ORDER — PROPOFOL 1000 MG/100ML IV EMUL
5.0000 ug/kg/min | INTRAVENOUS | Status: DC
  Administered 2022-01-26: 20 ug/kg/min via INTRAVENOUS
  Administered 2022-01-26: 30 ug/kg/min via INTRAVENOUS
  Administered 2022-01-26: 75 ug/kg/min via INTRAVENOUS
  Administered 2022-01-27: 30 ug/kg/min via INTRAVENOUS
  Administered 2022-01-27: 25 ug/kg/min via INTRAVENOUS
  Filled 2022-01-26 (×5): qty 100

## 2022-01-26 MED ORDER — ORAL CARE MOUTH RINSE: 15.0000 mL | OROMUCOSAL | Status: DC | PRN

## 2022-01-26 MED ORDER — POTASSIUM CHLORIDE 20 MEQ PO PACK: 40.0000 meq | PACK | Freq: Once | ORAL | Status: DC

## 2022-01-26 MED ORDER — ORAL CARE MOUTH RINSE
15.0000 mL | OROMUCOSAL | Status: DC
  Administered 2022-01-26 – 2022-01-27 (×19): 15 mL via OROMUCOSAL

## 2022-01-26 MED ORDER — POTASSIUM CHLORIDE 10 MEQ/100ML IV SOLN
10.0000 meq | INTRAVENOUS | Status: AC
  Administered 2022-01-26 (×4): 10 meq via INTRAVENOUS
  Filled 2022-01-26 (×4): qty 100

## 2022-01-26 NOTE — Consult Note (Signed)
ENT CONSULT:  Reason for Consult: Zenker's diverticulum with impacted food bolus   Referring Physician:  Dr. Elba Barman  HPI: Christina Patton is an 76 y.o. female who presented to [facility] long ED with complaints of dysphagia.  Patient has history of Zenker's diverticulum repair and reportedly was eating Brussels sprouts earlier when she developed a globus sensation.  ENT was called and recommended outpatient follow-up with barium swallow.  Patient subsequently was taken to endoscopy by gastroenterology for attempted removal of the food bolus, which were unsuccessful.  Otolaryngology was called urgently to the endoscopy suite for intraprocedural consult.  A DL was attempted, but limited secondary to lack of rigid esophagoscopy.  Patient was subsequently transferred to [facility], where Dr. Marcelline Deist took patient to the OR for laryngoscopy and esophagoscopy.  Per documentation, visualization of the esophagus was difficult and unsuccessful despite multiple attempts and various instrumentation.  Rigid endoscopy was not performed due to concern for possible perforation.  An NG tube was used to try to break up the food bolus and suction free pieces, I will try to pass other portions into the distal esophagus.  A post operative CT neck with contrast demonstrated a slightly smaller bolus measuring 1.9 x 2.5cm.  ENT reconsulted for further recommendations.   Past Medical History:  Diagnosis Date   Anxiety    Cancer ([place])    skin cancer on face   Osteoporosis    Zenker's diverticulum     Past Surgical History:  Procedure Laterality Date   ESOPHAGOGASTRODUODENOSCOPY N/A 01/25/2022   Procedure: ESOPHAGOGASTRODUODENOSCOPY (EGD);  Surgeon: Wilford Corner, MD;  Location: [facility];  Service: Gastroenterology;  Laterality: N/A;   ESOPHAGOSCOPY N/A 01/25/2022   Procedure: ESOPHAGOSCOPY;  Surgeon: Boyce Medici., MD;  Location: [place];  Service: ENT;  Laterality: N/A;   FOREIGN BODY REMOVAL   01/25/2022   Procedure: FOREIGN BODY REMOVAL;  Surgeon: Wilford Corner, MD;  Location: [facility];  Service: Gastroenterology;;   [facility] DIVERTICULECTOMY     operated/repaired    Family History  Problem Relation Age of Onset   Ovarian cancer Mother    Breast cancer Neg Hx     Social History:  reports that she has never smoked. She has never used smokeless tobacco. She reports that she does not drink alcohol. No history on file for drug use.  Allergies:  Allergies  Allergen Reactions   Glucosamine     Other reaction(s): stomach upset   Oxycodone Hcl     Other reaction(s): nausea   Sulfa Antibiotics     Other reaction(s): childhood allergy    Medications: I have reviewed the patient's current medications.  Results for orders placed or performed during the hospital encounter of 01/25/22 (from the past 48 hour(s))  MRSA Next Gen by PCR, Nasal     Status: None   Collection Time: 01/25/22 11:32 PM   Specimen: Nasal Mucosa; Nasal Swab  Result Value Ref Range   MRSA by PCR Next Gen NOT DETECTED NOT DETECTED    Comment: (NOTE) The GeneXpert MRSA Assay (FDA approved for NASAL specimens only), is one component of a comprehensive MRSA colonization surveillance program. It is not intended to diagnose MRSA infection nor to guide or monitor treatment for MRSA infections. Test performance is not FDA approved in patients less than 53 years old. Performed at [facility], [place] [address], [place], [place] [address]   Glucose, capillary     Status: Abnormal   Collection Time: 01/25/22 11:40 PM  Result Value Ref Range   Glucose-Capillary 100 (H) 70 - 99 mg/dL    Comment: Glucose reference range applies only to samples taken after fasting for at least 8 hours.  I-STAT 7, (LYTES, BLD GAS, ICA, H+H)     Status: Abnormal   Collection Time: 01/26/22  1:02 AM  Result Value Ref Range   pH, Arterial 7.485 (H) 7.35 - 7.45   pCO2 arterial 34.2 32 - 48 mmHg   pO2, Arterial 178 (H) 83  - 108 mmHg   Bicarbonate 25.8 20.0 - 28.0 mmol/L   TCO2 27 22 - 32 mmol/L   O2 Saturation 100 %   Acid-Base Excess 3.0 (H) 0.0 - 2.0 mmol/L   Sodium 137 135 - 145 mmol/L   Potassium 3.2 (L) 3.5 - 5.1 mmol/L   Calcium, Ion 1.17 1.15 - 1.40 mmol/L   HCT 46.0 36.0 - 46.0 %   Hemoglobin 15.6 (H) 12.0 - 15.0 g/dL   Patient temperature 97.9 F    Collection site RADIAL, ALLEN'S TEST ACCEPTABLE    Drawn by HIDE    Sample type ARTERIAL   CBC with Differential/Platelet     Status: Abnormal   Collection Time: 01/26/22  2:58 AM  Result Value Ref Range   WBC 14.4 (H) 4.0 - 10.5 K/uL   RBC 4.96 3.87 - 5.11 MIL/uL   Hemoglobin 16.0 (H) 12.0 - 15.0 g/dL   HCT 45.5 36.0 - 46.0 %   MCV 91.7 80.0 - 100.0 fL   MCH 32.3 26.0 - 34.0 pg   MCHC 35.2 30.0 - 36.0 g/dL   RDW 11.7 11.5 - 15.5 %   Platelets 196 150 - 400 K/uL    Comment: REPEATED TO VERIFY   nRBC 0.0 0.0 - 0.2 %   Neutrophils Relative % 91 %   Neutro Abs 13.0 (H) 1.7 - 7.7 K/uL   Lymphocytes Relative 6 %   Lymphs Abs 0.8 0.7 - 4.0 K/uL   Monocytes Relative 3 %   Monocytes Absolute 0.5 0.1 - 1.0 K/uL   Eosinophils Relative 0 %   Eosinophils Absolute 0.0 0.0 - 0.5 K/uL   Basophils Relative 0 %   Basophils Absolute 0.0 0.0 - 0.1 K/uL   Immature Granulocytes 0 %   Abs Immature Granulocytes 0.06 0.00 - 0.07 K/uL    Comment: Performed at [facility], [address], [place], [place] [address]  Protime-INR     Status: None   Collection Time: 01/26/22  2:58 AM  Result Value Ref Range   Prothrombin Time 13.2 11.4 - 15.2 seconds   INR 1.0 0.8 - 1.2    Comment: (NOTE) INR goal varies based on device and disease states. Performed at [facility], [place] [address], [place],  [address]   Comprehensive metabolic panel     Status: Abnormal   Collection Time: 01/26/22  2:58 AM  Result Value Ref Range   Sodium 139 135 - 145 mmol/L   Potassium 3.4 (L) 3.5 - 5.1 mmol/L   Chloride 102 98 - 111 mmol/L   CO2 23 22 - 32 mmol/L    Glucose, Bld 132 (H) 70 - 99 mg/dL    Comment: Glucose reference range applies only to samples taken after fasting for at least 8 hours.   BUN 8 8 - 23 mg/dL   Creatinine, Ser 0.86 0.44 - 1.00 mg/dL   Calcium 9.6 8.9 - 10.3 mg/dL   Total Protein 7.0 6.5 - 8.1 g/dL  Albumin 4.1 3.5 - 5.0 g/dL   AST 28 15 - 41 U/L   ALT 17 0 - 44 U/L   Alkaline Phosphatase 60 38 - 126 U/L   Total Bilirubin 1.1 0.3 - 1.2 mg/dL   GFR, Estimated >60 >60 mL/min    Comment: (NOTE) Calculated using the CKD-EPI Creatinine Equation (2021)    Anion gap 14 5 - 15    Comment: Performed at [facility] [address], [place], [place] [address]  Basic metabolic panel     Status: Abnormal   Collection Time: 01/26/22  7:22 AM  Result Value Ref Range   Sodium 134 (L) 135 - 145 mmol/L   Potassium 3.8 3.5 - 5.1 mmol/L   Chloride 100 98 - 111 mmol/L   CO2 20 (L) 22 - 32 mmol/L   Glucose, Bld 187 (H) 70 - 99 mg/dL    Comment: Glucose reference range applies only to samples taken after fasting for at least 8 hours.   BUN 9 8 - 23 mg/dL   Creatinine, Ser 0.80 0.44 - 1.00 mg/dL   Calcium 9.1 8.9 - 10.3 mg/dL   GFR, Estimated >60 >60 mL/min    Comment: (NOTE) Calculated using the CKD-EPI Creatinine Equation (2021)    Anion gap 14 5 - 15    Comment: Performed at [facility] [address], [place], [place] [address]  Glucose, capillary     Status: Abnormal   Collection Time: 01/26/22  7:53 AM  Result Value Ref Range   Glucose-Capillary 206 (H) 70 - 99 mg/dL    Comment: Glucose reference range applies only to samples taken after fasting for at least 8 hours.   Comment 1 Notify RN    Comment 2 Document in Chart   Glucose, capillary     Status: Abnormal   Collection Time: 01/26/22 12:03 PM  Result Value Ref Range   Glucose-Capillary 153 (H) 70 - 99 mg/dL    Comment: Glucose reference range applies only to samples taken after fasting for at least 8 hours.   Comment 1 Notify RN    Comment 2  Document in Chart   Glucose, capillary     Status: Abnormal   Collection Time: 01/26/22  3:35 PM  Result Value Ref Range   Glucose-Capillary 133 (H) 70 - 99 mg/dL    Comment: Glucose reference range applies only to samples taken after fasting for at least 8 hours.   Comment 1 Notify RN    Comment 2 Document in Chart     DG CHEST PORT 1 VIEW  Result Date: 01/26/2022 CLINICAL DATA:  Dysphagia EXAM: PORTABLE CHEST 1 VIEW COMPARISON:  01/25/2022 FINDINGS: Endotracheal tube terminates 2.5 cm above the carina. Heart size is normal. Probable area of scarring again noted in the left lung base. Lungs are otherwise clear. No pleural effusion or pneumothorax. IMPRESSION: Endotracheal tube appropriately positioned. No acute cardiopulmonary findings. Electronically Signed   By: Davina Poke D.O.   On: 01/26/2022 11:26   CT SOFT TISSUE NECK WO CONTRAST  Result Date: 01/26/2022 CLINICAL DATA:  Initial evaluation for esophageal diverticulum with food impaction, status post attempt at removal/decompression. EXAM: CT NECK WITHOUT CONTRAST TECHNIQUE: Multidetector CT imaging of the neck was performed following the standard protocol without intravenous contrast. RADIATION DOSE REDUCTION: This exam was performed according to the departmental dose-optimization program which includes automated exposure control, adjustment of the mA and/or kV according to patient size and/or use of iterative reconstruction technique. COMPARISON:  CT from 01/25/2022. FINDINGS: Pharynx and larynx: Endotracheal tube in place. Tip is somewhat low lying at the level of the carina projecting towards the right mainstem bronchus. Retraction by proximally 2 cm suggested to avoid inferior slippage. The supraglottic larynx and glottis is largely collapsed around the 2 and not well assessed. Previously seen impacted bolus at the level of the proximal esophagus is now decreased in size, although a smaller bolus remains measuring approximately 1.9 x  2.5 cm (series 3, image 77). This is favored to reflect impacted food within an esophageal diverticulum, although again an obstructive stricture or mass is not excluded. Visualized esophagus is decompressed distally. No pneumomediastinum to suggest perforation or other complication status post intervention. No radiopaque foreign body. Salivary glands: Parotid and submandibular glands are within normal limits. Thyroid: Negative. Lymph nodes: No enlarged or concerning adenopathy within the neck. Vascular: Not well assessed due to lack of IV contrast. No significant atheromatous disease. Limited intracranial: Unremarkable. Visualized orbits: Prior bilateral ocular lens replacement. Otherwise unremarkable. Mastoids and visualized paranasal sinuses: Mild mucoperiosteal thickening about the ethmoidal air cells. Paranasal sinuses are otherwise largely clear. Visualized mastoid no discrete or worrisome osseous lesions. Air cells and middle ear cavities are well pneumatized and free of fluid. Skeleton: No discrete or worrisome osseous lesions. Upper chest: Mild dependent atelectatic changes noted within the visualized lungs. Visualized lungs are otherwise clear. No pneumothorax. Other: None. IMPRESSION: 1. Previously seen impacted food bolus at the level of the proximal esophagus is decreased in size, although a smaller bolus remains. This is favored to reflect impacted food within an esophageal diverticulum, although again an obstructive stricture or mass is not excluded. No evidence for perforation or other complication status post intervention. 2. Endotracheal tube in place with tip somewhat low lying at the level of the carina projecting towards the right mainstem bronchus. Retraction by 2 cm suggested to avoid slippage into the mainstem bronchus. Electronically Signed   By: Jeannine Boga M.D.   On: 01/26/2022 01:16   DG CHEST PORT 1 VIEW  Result Date: 01/26/2022 CLINICAL DATA:  Intubated EXAM: PORTABLE CHEST  1 VIEW COMPARISON:  09/12/2016 FINDINGS: Endotracheal tube tip is about 3 cm superior to the carina. Curvilinear opacity at the left lung base suggestive of scarring. Right lung is clear. Normal cardiac size. No pneumothorax. IMPRESSION: 1. Endotracheal tube tip about 3 cm superior to carina 2. Curvilinear opacity in the left lower lung suggestive of scarring Electronically Signed   By: Donavan Foil M.D.   On: 01/26/2022 00:03   CT Soft Tissue Neck Wo Contrast  Result Date: 01/25/2022 CLINICAL DATA:  Patient states that she was eating brussel sprouts last night and felt something get stuck in her throat, CX-ray done showing probable FB. GI consult to proceed with CT scan. Hx of Zenkers Diverticulum EXAM: CT NECK WITHOUT CONTRAST TECHNIQUE: Multidetector CT imaging of the neck was performed following the standard protocol without intravenous contrast. RADIATION DOSE REDUCTION: This exam was performed according to the departmental dose-optimization program which includes automated exposure control, adjustment of the mA and/or kV according to patient size and/or use of iterative reconstruction technique. COMPARISON:  Soft tissue neck 01/25/2022 FINDINGS: Pharynx and larynx: No radiopaque foreign body. Normal pharynx. No mass lesion. Epiglottis and larynx normal. There is impacted food in the proximal esophagus which is significantly dilated. This accounts for the soft tissue thickening in the prevertebral space on the lateral soft tissue neck x-ray. No definite mass lesion identified. Salivary glands: No inflammation,  mass, or stone. Thyroid: Negative Lymph nodes: No enlarged lymph nodes in the neck. Vascular: Limited vascular evaluation without intravenous contrast. Limited intracranial: Negative Visualized orbits: Bilateral cataract extraction. No orbital lesion. Mastoids and visualized paranasal sinuses: Paranasal sinuses clear. Mastoid and middle ear clear. Skeleton: Degenerative change in the cervical spine.  No acute abnormality. Upper chest: Lung apices clear bilaterally Other: None IMPRESSION: Impacted food in the dilated proximal esophagus. This could be due to stricture or tumor or diverticulum. GI consultation recommended. No radiopaque foreign body in the pharynx or esophagus. Electronically Signed   By: Franchot Gallo M.D.   On: 01/25/2022 17:21   DG Neck Soft Tissue  Result Date: 01/25/2022 CLINICAL DATA:  Globus sensation EXAM: NECK SOFT TISSUES - 1+ VIEW COMPARISON:  None Available. FINDINGS: Abnormal prevertebral soft tissue thickening at the C7 level measuring 2.6 cm in thickness. There is no evidence of epiglottic enlargement. The cervical airway is unremarkable and no radio-opaque foreign body identified. IMPRESSION: Abnormal prevertebral soft tissue thickening at the C7 level. Impacted food bolus within the proximal esophagus is not excluded given the history. Electronically Signed   By: Davina Poke D.O.   On: 01/25/2022 15:49    ROS:ROS  Blood pressure 110/64, pulse 63, temperature 98.1 F (36.7 C), temperature source Axillary, resp. rate 16, height '5\' 4"'$  (1.626 m), weight 54.1 kg, SpO2 98 %.  PHYSICAL EXAM: CONSTITUTIONAL: intubated, sedated HENT: Head : normocephalic and atraumatic Ears: Right ear:   canal normal, external ear normal and hearing normal Left ear:   canal normal, external ear normal and hearing normal Nose: nose normal  Mouth/Throat:  Mouth: ETT tube in place EYES: conjunctiva normal, EOM normal and PERRL NECK: supple, trachea normal and no thyromegaly or cervical LAD  Studies Reviewed: CT neck with contrast performed 629 and 630 personally reviewed, documentation from gastroenterology and otolaryngology reviewed  Assessment/Plan: JAMETTA MOOREHEAD is a 76 y/o F with history of Zenker's diverticulum repair many years ago with proximal esophageal food bolus. -Attempts at endoscopic removal by gastroenterology and otolaryngology unsuccessful.  Patient was  discussed with Dr. Marcelline Deist (ENT), who felt that further attempts with rigid esophagoscopy would not be successful and poses risk of esophageal tear/injury.  I agree with this assessment.  Recommended transfer to [facility]ol[facility]ology at [facility] for consideration of open pharyngotomy, however this was declined.  Discussed with critical care, and recommended discussion with [facility] or [facility] otolaryngology; due to complexity and previously failed attempts at endoscopic removal, patient will be best served in tertiary care institution.    Thank you for allowing me to participate in the care of this patient. Please do not hesitate to contact me with any questions or concerns.   Jason Coop, [facility] Cell: [phone]   01/26/2022, 7:05 PM

## 2022-01-26 NOTE — Progress Notes (Signed)
Pt transported to ct and back with no complications.

## 2022-01-26 NOTE — Progress Notes (Addendum)
PCCM Update: Transfer to outside facility recommended by ENT. No further procedures recommended by GI or ENT.   Patient denied transfer to [facility] based on bed availability.  Patient denied transfer to [facility]. Patient denied transfer to [facility] in [place] as nothing more to offer.   CT Neck images have been powershared to [facility] and [facility].  65 minutes spent coordinating care between GI and ENT services along with outside hospitals.   Freda Jackson, MD [facility] Office: [phone]   See Amion for personal pager PCCM on call pager [phone] until 7pm. Please call El[facility] 7p-7a. [phone]

## 2022-01-26 NOTE — Progress Notes (Signed)
[facility] ADULT ICU REPLACEMENT PROTOCOL   The patient does apply for the [facility] Adult ICU Electrolyte Replacment Protocol based on the criteria listed below:   1.Exclusion criteria: TCTS patients, ECMO patients, and Dialysis patients 2. Is GFR >/= 30 ml/min? Yes.    Patient's GFR today is >60 3. Is SCr </= 2? No. Patient's SCr is 0.86 mg/dL 4. Did SCr increase >/= 0.5 in 24 hours? No. 5.Pt's weight >40kg  Yes.   6. Abnormal electrolyte(s): 0.86  7. Electrolytes replaced per protocol 8.  Call MD STAT for K+ </= 2.5, Phos </= 1, or Mag </= 1 Physician:  Dr. Baxter Kail, Janace Hoard 01/26/2022 4:51 AM

## 2022-01-26 NOTE — Progress Notes (Addendum)
Initial Nutrition Assessment  DOCUMENTATION CODES:   Non-severe (moderate) malnutrition in context of chronic illness  INTERVENTION:   When enteral access is placed, recommend initiate tube feeding: Jevity 1.2 at 30 ml/h, increase by 10 ml every 4 hours to goal rate of 60 ml/h (1440 ml per day).  Provides 1728 kcal, 80 gm protein, 1181 ml free water daily.  NUTRITION DIAGNOSIS:   Moderate Malnutrition related to chronic illness (Zenker's diverticulum, dysphagia) as evidenced by mild muscle depletion, moderate muscle depletion, mild fat depletion.  GOAL:   Patient will meet greater than or equal to 90% of their needs.  MONITOR:   Vent status, Labs, I & O's  REASON FOR ASSESSMENT:   Ventilator    ASSESSMENT:   76 yo female admitted with impacted food bolus in esophagus. Required intubation for airway protection. PMH includes Zenker's diverticulum S/P repair, dysphagia, osteoporosis, skin cancer (face), anxiety.  Discussed patient in ICU rounds and with RN today. ENT and GI have tried to remove lodged esophageal food bolus, but CT scan still shows impacted food. Patient to remain intubated until further procedure planning by ENT and GI.  Patient does not have enteral access, so unable to being tube feeding at this time.   Patient is currently intubated on ventilator support MV: 6.9 L/min Temp (24hrs), Avg:97.1 F (36.2 C), Min:96.6 F (35.9 C), Max:97.5 F (36.4 C)  Propofol: 6.6 ml/hr providing 174 kcal from lipid.  Labs reviewed. Na 134 CBG: 206-153  Medications reviewed and include Protonix, fentanyl, propofol.  IVF: D5 LR at 30 ml/h  Weight history reviewed.  Patient with 7% weight loss over the past 3.5 months.  Patient unable to provide any nutrition hx.  NUTRITION - FOCUSED PHYSICAL EXAM:  Flowsheet Row Most Recent Value  Orbital Region Mild depletion  Upper Arm Region Mild depletion  Thoracic and Lumbar Region Mild depletion  Buccal Region Unable  to assess  Temple Region Moderate depletion  Clavicle Bone Region Moderate depletion  Clavicle and Acromion Bone Region Mild depletion  Scapular Bone Region Moderate depletion  Dorsal Hand Moderate depletion  Patellar Region Mild depletion  Anterior Thigh Region Mild depletion  Posterior Calf Region Moderate depletion  Edema (RD Assessment) None  Hair Reviewed  Eyes Reviewed  Mouth Unable to assess  Skin Reviewed  Nails Reviewed       Diet Order:   Diet Order             Diet NPO time specified  Diet effective now                   EDUCATION NEEDS:   No education needs have been identified at this time  Skin:  Skin Assessment: Reviewed RN Assessment  Last BM:  no BM documented  Height:   Ht Readings from Last 1 Encounters:  01/25/22 '5\' 4"'$  (1.626 m)    Weight:   Wt Readings from Last 1 Encounters:  01/26/22 54.1 kg    Ideal Body Weight:  54.5 kg  BMI:  Body mass index is 20.47 kg/m.  Estimated Nutritional Needs:   Kcal:  1600-1800  Protein:  80-90 gm  Fluid:  1.6-1.8 L   Lucas Mallow RD, LDN, CNSC Please refer to Amion for contact information.

## 2022-01-26 NOTE — Progress Notes (Signed)
NAME:  Christina Patton, MRN:  [id number], DOB:  [birth year], LOS: 1 ADMISSION DATE:  01/25/2022, CONSULTATION DATE:  01/25/22 REFERRING MD:  Marcelline Deist CHIEF COMPLAINT:  Impacted food bolus in esophagus   History of Present Illness:  Christina Patton is a 76 yo woman with a hx of Zenker's diverticulum (surgery years ago), presented today with feeling of food stuck in her throat from 6/28 pm after eating undercooked brussels sprouts.  Able to pass liquids and saliva but unable to eat.   Hx of dysphagia for years, resolved after surgery several years ago.   GI (Dr. Michail Sermon) attempted to remove food.   Per their op note: food found at cricopharyngeus.  Unable to remove substantial portion of food.   ENT called intraoperatively at [facility], no rigid endoscopes actually available however.  Tx to [facility] cone for second attempt, this time with rigid scope.  Unable to completely remove lodged esophageal food bolus.  Pertinent  Medical History  Anxiety  Hx of skin cancer (face) Osteoporosis S/p zenkers diverticulectomy   Significant Hospital Events: Including procedures, antibiotic start and stop dates in addition to other pertinent events   6/29: admitted to ICU, intubated  Interim History / Subjective:   Remains intubated No acute events over night   Objective   Blood pressure 108/65, pulse (!) 52, temperature (!) 97.5 F (36.4 C), temperature source Oral, resp. rate 16, height '5\' 4"'$  (1.626 m), weight 54.1 kg, SpO2 98 %.    Vent Mode: PRVC FiO2 (%):  [30 %-100 %] 30 % Set Rate:  [16 bmp] 16 bmp Vt Set:  [430 mL-440 mL] 430 mL PEEP:  [5 cmH20] 5 cmH20 Plateau Pressure:  [12 cmH20] 12 cmH20   Intake/Output Summary (Last 24 hours) at 01/26/2022 0732 Last data filed at 01/26/2022 0500 Gross per 24 hour  Intake 1622.49 ml  Output 1330 ml  Net 292.49 ml   Filed Weights   01/25/22 1921 01/26/22 0328  Weight: 55.3 kg 54.1 kg    Examination: General: elderly woman, intubated, no acute  distress HENT: [place]/AT, ET tube in place, moist mucous membranes Lungs: clear to auscultation, no wheezing Cardiovascular: rrr, no murmurs Abdomen: soft, non-tender, non-distended, BS+ Extremities: warm, no edema Neuro: alert, moving all extremities GU: n/a  Repeat CT Neck 6/29 Previously seen impacted food bolus at the level of the proximal esophagus is decreased in size, although a smaller bolus remains. This is favored to reflect impacted food within an esophageal diverticulum, although again an obstructive stricture or mass is not excluded. No evidence for perforation or other complication status post intervention.  Resolved Hospital Problem list     Assessment & Plan:  Impacted food bolus in esophagus  hx of zenkers diverticula. Intubation for Airway protection Hx of anxiety  - patient to remain intubated until further procedure planning by ENT and GI as CT scan continues to show impacted food.  - Propofol and fentanyl for sedation. PRN versed given history of anxiety and benzo use at home - repeat CXR to check placement of ET tube  Best Practice (right click and "Reselect all SmartList Selections" daily)   Diet/type: NPO DVT prophylaxis: SCD GI prophylaxis: PPI Lines: N/A Foley:  N/A Code Status:  full code Last date of multidisciplinary goals of care discussion [6/29]  Labs   CBC: Recent Labs  Lab 01/26/22 0102 01/26/22 0258  WBC  --  14.4*  NEUTROABS  --  13.0*  HGB 15.6* 16.0*  HCT 46.0 45.5  MCV  --  91.7  PLT  --  096    Basic Metabolic Panel: Recent Labs  Lab 01/26/22 0102 01/26/22 0258  NA 137 139  K 3.2* 3.4*  CL  --  102  CO2  --  23  GLUCOSE  --  132*  BUN  --  8  CREATININE  --  0.86  CALCIUM  --  9.6   GFR: Estimated Creatinine Clearance: 46.8 mL/min (by C-G formula based on SCr of 0.86 mg/dL). Recent Labs  Lab 01/26/22 0258  WBC 14.4*    Liver Function Tests: Recent Labs  Lab 01/26/22 0258  AST 28  ALT 17  ALKPHOS 60   BILITOT 1.1  PROT 7.0  ALBUMIN 4.1   No results for input(s): "LIPASE", "AMYLASE" in the last 168 hours. No results for input(s): "AMMONIA" in the last 168 hours.  ABG    Component Value Date/Time   PHART 7.485 (H) 01/26/2022 0102   PCO2ART 34.2 01/26/2022 0102   PO2ART 178 (H) 01/26/2022 0102   HCO3 25.8 01/26/2022 0102   TCO2 27 01/26/2022 0102   O2SAT 100 01/26/2022 0102     Coagulation Profile: Recent Labs  Lab 01/26/22 0258  INR 1.0    Cardiac Enzymes: No results for input(s): "CKTOTAL", "CKMB", "CKMBINDEX", "TROPONINI" in the last 168 hours.  HbA1C: No results found for: "HGBA1C"  CBG: Recent Labs  Lab 01/25/22 2340  GLUCAP 100*    Critical care time: 32 minutes    Freda Jackson, MD [facility] Office: [phone]   See Amion for personal pager PCCM on call pager [phone] until 7pm. Please call Elink 7p-7a. [phone]

## 2022-01-26 NOTE — Progress Notes (Signed)
eLink Physician-Brief Progress Note Patient Name: Christina Patton DOB: Dec 05, 1945 MRN: [id number]   Date of Service  01/26/2022  HPI/Events of Note  75/F with hx of Zenker's diverticulum repair and was eating brussell sprouts presenting with globus sensation.  Pt was transferred from [facility] to [facility] after an unsuccessful attempt at removing the bolus by GI.  Pt kept intubated and transferred to the OR for rigid scope by ENT.   Food particles were suctioned by ENT.  Pt kept intubated and transferred to the ICU.   Pt is intubated and sedated.    eICU Interventions  Impacted food bolus in esophagus Hx of zenker's diverticulum s/p repair in the past Acute respiratory failure Hypertension  Pt will be kept intubated for airway protection. Continue with analgosedation. Follow up CT of the neck . NPO.  Continue PPI.  Hydralazine IV PRN for SBP >160. SCDs for DVT Prophylaxis.      Intervention Category Evaluation Type: New Patient Evaluation  [name] 01/26/2022, 12:26 AM

## 2022-01-27 DIAGNOSIS — Z79899 Other long term (current) drug therapy: Secondary | ICD-10-CM | POA: Diagnosis not present

## 2022-01-27 DIAGNOSIS — R609 Edema, unspecified: Secondary | ICD-10-CM | POA: Diagnosis not present

## 2022-01-27 DIAGNOSIS — X58XXXA Exposure to other specified factors, initial encounter: Secondary | ICD-10-CM | POA: Diagnosis not present

## 2022-01-27 DIAGNOSIS — K317 Polyp of stomach and duodenum: Secondary | ICD-10-CM | POA: Diagnosis not present

## 2022-01-27 DIAGNOSIS — F419 Anxiety disorder, unspecified: Secondary | ICD-10-CM | POA: Diagnosis not present

## 2022-01-27 DIAGNOSIS — Z85828 Personal history of other malignant neoplasm of skin: Secondary | ICD-10-CM | POA: Diagnosis not present

## 2022-01-27 DIAGNOSIS — Z882 Allergy status to sulfonamides status: Secondary | ICD-10-CM | POA: Diagnosis not present

## 2022-01-27 DIAGNOSIS — F32A Depression, unspecified: Secondary | ICD-10-CM | POA: Diagnosis not present

## 2022-01-27 DIAGNOSIS — Z9911 Dependence on respirator [ventilator] status: Secondary | ICD-10-CM | POA: Diagnosis not present

## 2022-01-27 DIAGNOSIS — Z20822 Contact with and (suspected) exposure to covid-19: Secondary | ICD-10-CM | POA: Diagnosis not present

## 2022-01-27 DIAGNOSIS — R000 Tachycardia, unspecified: Secondary | ICD-10-CM | POA: Diagnosis not present

## 2022-01-27 DIAGNOSIS — T18108A Unspecified foreign body in esophagus causing other injury, initial encounter: Secondary | ICD-10-CM | POA: Diagnosis not present

## 2022-01-27 DIAGNOSIS — K449 Diaphragmatic hernia without obstruction or gangrene: Secondary | ICD-10-CM | POA: Diagnosis not present

## 2022-01-27 DIAGNOSIS — I4891 Unspecified atrial fibrillation: Secondary | ICD-10-CM | POA: Diagnosis not present

## 2022-01-27 DIAGNOSIS — I959 Hypotension, unspecified: Secondary | ICD-10-CM | POA: Diagnosis not present

## 2022-01-27 DIAGNOSIS — R509 Fever, unspecified: Secondary | ICD-10-CM | POA: Diagnosis not present

## 2022-01-27 DIAGNOSIS — D62 Acute posthemorrhagic anemia: Secondary | ICD-10-CM | POA: Diagnosis not present

## 2022-01-27 DIAGNOSIS — K2270 Barrett's esophagus without dysplasia: Secondary | ICD-10-CM | POA: Diagnosis not present

## 2022-01-27 DIAGNOSIS — M810 Age-related osteoporosis without current pathological fracture: Secondary | ICD-10-CM | POA: Diagnosis not present

## 2022-01-27 DIAGNOSIS — K225 Diverticulum of esophagus, acquired: Secondary | ICD-10-CM | POA: Diagnosis not present

## 2022-01-27 DIAGNOSIS — J384 Edema of larynx: Secondary | ICD-10-CM | POA: Diagnosis not present

## 2022-01-27 DIAGNOSIS — J189 Pneumonia, unspecified organism: Secondary | ICD-10-CM | POA: Diagnosis not present

## 2022-01-27 DIAGNOSIS — T18128D Food in esophagus causing other injury, subsequent encounter: Secondary | ICD-10-CM | POA: Diagnosis not present

## 2022-01-27 DIAGNOSIS — R918 Other nonspecific abnormal finding of lung field: Secondary | ICD-10-CM | POA: Diagnosis not present

## 2022-01-27 DIAGNOSIS — E440 Moderate protein-calorie malnutrition: Secondary | ICD-10-CM | POA: Diagnosis not present

## 2022-01-27 DIAGNOSIS — T17828A Food in other parts of respiratory tract causing other injury, initial encounter: Secondary | ICD-10-CM | POA: Diagnosis not present

## 2022-01-27 DIAGNOSIS — K2970 Gastritis, unspecified, without bleeding: Secondary | ICD-10-CM | POA: Diagnosis not present

## 2022-01-27 DIAGNOSIS — Z6820 Body mass index (BMI) 20.0-20.9, adult: Secondary | ICD-10-CM | POA: Diagnosis not present

## 2022-01-27 DIAGNOSIS — T18128A Food in esophagus causing other injury, initial encounter: Secondary | ICD-10-CM | POA: Diagnosis not present

## 2022-01-27 DIAGNOSIS — R2689 Other abnormalities of gait and mobility: Secondary | ICD-10-CM | POA: Diagnosis not present

## 2022-01-27 DIAGNOSIS — Z885 Allergy status to narcotic agent status: Secondary | ICD-10-CM | POA: Diagnosis not present

## 2022-01-27 DIAGNOSIS — R9431 Abnormal electrocardiogram [ECG] [EKG]: Secondary | ICD-10-CM | POA: Diagnosis not present

## 2022-01-27 DIAGNOSIS — J690 Pneumonitis due to inhalation of food and vomit: Secondary | ICD-10-CM | POA: Diagnosis not present

## 2022-01-27 DIAGNOSIS — Z978 Presence of other specified devices: Secondary | ICD-10-CM

## 2022-01-27 DIAGNOSIS — K3189 Other diseases of stomach and duodenum: Secondary | ICD-10-CM | POA: Diagnosis not present

## 2022-01-27 DIAGNOSIS — R0689 Other abnormalities of breathing: Secondary | ICD-10-CM | POA: Diagnosis not present

## 2022-01-27 DIAGNOSIS — A419 Sepsis, unspecified organism: Secondary | ICD-10-CM | POA: Diagnosis not present

## 2022-01-27 DIAGNOSIS — Z9181 History of falling: Secondary | ICD-10-CM | POA: Diagnosis not present

## 2022-01-27 DIAGNOSIS — J9811 Atelectasis: Secondary | ICD-10-CM | POA: Diagnosis not present

## 2022-01-27 DIAGNOSIS — R1310 Dysphagia, unspecified: Secondary | ICD-10-CM | POA: Diagnosis not present

## 2022-01-27 DIAGNOSIS — E878 Other disorders of electrolyte and fluid balance, not elsewhere classified: Secondary | ICD-10-CM | POA: Diagnosis not present

## 2022-01-27 DIAGNOSIS — K222 Esophageal obstruction: Secondary | ICD-10-CM | POA: Diagnosis not present

## 2022-01-27 DIAGNOSIS — Z7983 Long term (current) use of bisphosphonates: Secondary | ICD-10-CM | POA: Diagnosis not present

## 2022-01-27 DIAGNOSIS — R69 Illness, unspecified: Secondary | ICD-10-CM | POA: Diagnosis not present

## 2022-01-27 DIAGNOSIS — Z4682 Encounter for fitting and adjustment of non-vascular catheter: Secondary | ICD-10-CM | POA: Diagnosis not present

## 2022-01-27 DIAGNOSIS — K2289 Other specified disease of esophagus: Secondary | ICD-10-CM | POA: Diagnosis not present

## 2022-01-27 DIAGNOSIS — Z888 Allergy status to other drugs, medicaments and biological substances status: Secondary | ICD-10-CM | POA: Diagnosis not present

## 2022-01-27 LAB — GLUCOSE, CAPILLARY
Glucose-Capillary: 109 mg/dL — ABNORMAL HIGH (ref 70–99)
Glucose-Capillary: 118 mg/dL — ABNORMAL HIGH (ref 70–99)
Glucose-Capillary: 120 mg/dL — ABNORMAL HIGH (ref 70–99)
Glucose-Capillary: 146 mg/dL — ABNORMAL HIGH (ref 70–99)

## 2022-01-27 LAB — BASIC METABOLIC PANEL
Anion gap: 12 (ref 5–15)
BUN: 13 mg/dL (ref 8–23)
CO2: 18 mmol/L — ABNORMAL LOW (ref 22–32)
Calcium: 8.4 mg/dL — ABNORMAL LOW (ref 8.9–10.3)
Chloride: 104 mmol/L (ref 98–111)
Creatinine, Ser: 0.83 mg/dL (ref 0.44–1.00)
GFR, Estimated: >60 mL/min (ref >60)
Glucose, Bld: 110 mg/dL — ABNORMAL HIGH (ref 70–99)
Potassium: 3.5 mmol/L (ref 3.5–5.1)
Sodium: 134 mmol/L — ABNORMAL LOW (ref 135–145)

## 2022-01-27 LAB — TRIGLYCERIDES: Triglycerides: 82 mg/dL (ref <150)

## 2022-01-27 MED ORDER — DEXTROSE IN LACTATED RINGERS 5 % IV SOLN: 30.0000 mL/h | INTRAVENOUS | 1 refills | Status: AC

## 2022-01-27 MED ORDER — FENTANYL 2500MCG IN NS 250ML (10MCG/ML) PREMIX INFUSION: 0.0000 ug/h | INTRAVENOUS | 0 refills | Status: DC

## 2022-01-27 MED ORDER — POLYETHYLENE GLYCOL 3350 17 G PO PACK
17.0000 g | PACK | Freq: Every day | ORAL | 0 refills | Status: AC | PRN
Start: 2022-01-27 — End: ?

## 2022-01-27 MED ORDER — HYDRALAZINE HCL 20 MG/ML IJ SOLN
10.0000 mg | INTRAMUSCULAR | Status: AC | PRN
Start: 2022-01-27 — End: ?

## 2022-01-27 MED ORDER — PANTOPRAZOLE SODIUM 40 MG IV SOLR: 40.0000 mg | INTRAVENOUS | Status: AC

## 2022-01-27 MED ORDER — DOCUSATE SODIUM 50 MG/5ML PO LIQD: 100.0000 mg | Freq: Two times a day (BID) | ORAL | 0 refills | Status: AC | PRN

## 2022-01-27 MED ORDER — ONDANSETRON HCL 4 MG/2ML IJ SOLN: 4.0000 mg | Freq: Four times a day (QID) | INTRAMUSCULAR | 0 refills | Status: AC | PRN

## 2022-01-27 MED ORDER — DEXTROSE IN LACTATED RINGERS 5 % IV SOLN: INTRAVENOUS | Status: DC

## 2022-01-27 MED ORDER — MIDAZOLAM HCL 2 MG/2ML IJ SOLN: 1.0000 mg | INTRAMUSCULAR | 0 refills | Status: DC | PRN

## 2022-01-27 MED ORDER — PROPOFOL 1000 MG/100ML IV EMUL: 5.0000 ug/kg/min | INTRAVENOUS | Status: DC

## 2022-01-27 NOTE — Progress Notes (Signed)
Sister-in-law, Danya Spearman, took patients belongings home. Clothes, glasses, and purse.

## 2022-01-27 NOTE — Progress Notes (Signed)
NAME:  Christina Patton, MRN:  [id number], DOB:  11-Mar-1946, LOS: 2 ADMISSION DATE:  01/25/2022, CONSULTATION DATE:  01/25/22 REFERRING MD:  Marcelline Deist CHIEF COMPLAINT:  Impacted food bolus in esophagus   History of Present Illness:  Ms Debruyn is a 76 yo woman with a hx of Zenker's diverticulum (surgery years ago), presented today with feeling of food stuck in her throat from 6/28 pm after eating undercooked brussels sprouts.  Able to pass liquids and saliva but unable to eat.   Hx of dysphagia for years, resolved after surgery several years ago.   GI (Dr. Michail Sermon) attempted to remove food.   Per their op note: food found at cricopharyngeus.  Unable to remove substantial portion of food.   ENT called intraoperatively at [facility], no rigid endoscopes actually available however.  Tx to [facility] for second attempt, this time with rigid scope.  Unable to completely remove lodged esophageal food bolus.  Pertinent  Medical History  Anxiety  Hx of skin cancer (face) Osteoporosis S/p zenkers diverticulectomy   Significant Hospital Events: Including procedures, antibiotic start and stop dates in addition to other pertinent events   6/29: admitted to ICU, intubated  Interim History / Subjective:   Remains intubated No acute events over night Unable to find transfer center yesterday   Objective   Blood pressure (!) 99/54, pulse (!) 50, temperature 98.3 F (36.8 C), temperature source Oral, resp. rate 16, height '5\' 4"'$  (1.626 m), weight 53.9 kg, SpO2 98 %.    Vent Mode: PRVC FiO2 (%):  [30 %] 30 % Set Rate:  [16 bmp] 16 bmp Vt Set:  [430 mL] 430 mL PEEP:  [5 cmH20] 5 cmH20 Plateau Pressure:  [13 cmH20-14 cmH20] 13 cmH20   Intake/Output Summary (Last 24 hours) at 01/27/2022 0740 Last data filed at 01/27/2022 0700 Gross per 24 hour  Intake 1866.36 ml  Output 720 ml  Net 1146.36 ml   Filed Weights   01/25/22 1921 01/26/22 0328 01/27/22 0500  Weight: 55.3 kg 54.1 kg 53.9 kg     Examination: General: elderly woman, intubated, no acute distress HENT: [place]/AT, ET tube in place, moist mucous membranes Lungs: clear to auscultation, no wheezing Cardiovascular: rrr, no murmurs Abdomen: soft, non-tender, non-distended, BS+ Extremities: warm, no edema Neuro: alert, moving all extremities GU: n/a  Repeat CT Neck 6/29 Previously seen impacted food bolus at the level of the proximal esophagus is decreased in size, although a smaller bolus remains. This is favored to reflect impacted food within an esophageal diverticulum, although again an obstructive stricture or mass is not excluded. No evidence for perforation or other complication status post intervention.  Resolved Hospital Problem list     Assessment & Plan:  Impacted food bolus in esophagus  hx of zenkers diverticula. Intubation for Airway protection Hx of anxiety  - patient to remain intubated for airway protection - ENT recommending transfer to tertiary center for consideration of open pharyngotomy - Will call [facility] today to request transfer - Propofol and fentanyl for sedation. PRN versed given history of anxiety and benzo use at home  Best Practice (right click and "Reselect all SmartList Selections" daily)   Diet/type: NPO DVT prophylaxis: SCD GI prophylaxis: PPI Lines: N/A Foley:  N/A Code Status:  full code Last date of multidisciplinary goals of care discussion [6/29]  Labs   CBC: Recent Labs  Lab 01/26/22 0102 01/26/22 0258  WBC  --  14.4*  NEUTROABS  --  13.0*  HGB  15.6* 16.0*  HCT 46.0 45.5  MCV  --  91.7  PLT  --  269    Basic Metabolic Panel: Recent Labs  Lab 01/26/22 0102 01/26/22 0258 01/26/22 0722  NA 137 139 134*  K 3.2* 3.4* 3.8  CL  --  102 100  CO2  --  23 20*  GLUCOSE  --  132* 187*  BUN  --  8 9  CREATININE  --  0.86 0.80  CALCIUM  --  9.6 9.1   GFR: Estimated Creatinine Clearance: 50.1 mL/min (by C-G formula based on SCr of 0.8  mg/dL). Recent Labs  Lab 01/26/22 0258  WBC 14.4*    Liver Function Tests: Recent Labs  Lab 01/26/22 0258  AST 28  ALT 17  ALKPHOS 60  BILITOT 1.1  PROT 7.0  ALBUMIN 4.1   No results for input(s): "LIPASE", "AMYLASE" in the last 168 hours. No results for input(s): "AMMONIA" in the last 168 hours.  ABG    Component Value Date/Time   PHART 7.485 (H) 01/26/2022 0102   PCO2ART 34.2 01/26/2022 0102   PO2ART 178 (H) 01/26/2022 0102   HCO3 25.8 01/26/2022 0102   TCO2 27 01/26/2022 0102   O2SAT 100 01/26/2022 0102     Coagulation Profile: Recent Labs  Lab 01/26/22 0258  INR 1.0    Cardiac Enzymes: No results for input(s): "CKTOTAL", "CKMB", "CKMBINDEX", "TROPONINI" in the last 168 hours.  HbA1C: No results found for: "HGBA1C"  CBG: Recent Labs  Lab 01/26/22 1535 01/26/22 1936 01/26/22 2325 01/27/22 0314 01/27/22 0738  GLUCAP 133* 130* 115* 109* 118*    Critical care time: 31 minutes    Freda Jackson, MD [place] [facility] Office: [phone]   See Amion for personal pager PCCM on call pager [phone] until 7pm. Please call Elink 7p-7a. [phone]

## 2022-01-27 NOTE — Discharge Summary (Signed)
Physician Discharge Summary  Patient ID: Christina Patton MRN: [id number] DOB/AGE: [birth year] 76 y.o.  Admit date: 01/25/2022 Discharge date: 01/27/2022  Admission Diagnoses: Food Impaction of proximal esophagus  Discharge Diagnoses:  Principal Problem:   Food impaction of esophagus Active Problems:   Esophageal diverticulum   Endotracheally intubated   Discharged Condition: stable  Hospital Course:  Christina Patton is a 76 year old woman with history of Zenker's Diverticulum stats post repair in 2009 who presented to [facility] on 6/29 with complaint of food (brussels sprouts) being stuck. CT Neck obtained which showed proximal esophageal food impaction. GI was consulted and patient transferred to [facility] for endoscopic removal of food which was unsuccessful as food was noted to be stuck at the cricopharyngeus. ENT was then consulted for further evaluation and treatment. She was transferred to [facility] on 6/29 for rigid laryngoscope which again was unsuccessful at removing the lodged food bolus. ENT did not recommend further intervention at this time due to concern for esophageal tear/injury. ENT recommended transfer to tertiary care center for consideration of open pharyngotomy. Patient has remained intubated and in the medical ICU sedated with propofol and fentanyl.   Consults: pulmonary/intensive care, GI, and ENT  Significant Diagnostic Studies:  CT Neck 01/26/22 FINDINGS: Pharynx and larynx: Endotracheal tube in place. Tip is somewhat low lying at the level of the carina projecting towards the right mainstem bronchus. Retraction by proximally 2 cm suggested to avoid inferior slippage. The supraglottic larynx and glottis is largely collapsed around the 2 and not well assessed. Previously seen impacted bolus at the level of the proximal esophagus is now decreased in size, although a smaller bolus remains measuring approximately 1.9 x 2.5 cm (series 3,  image 77). This is favored to reflect impacted food within an esophageal diverticulum, although again an obstructive stricture or mass is not excluded. Visualized esophagus is decompressed distally. No pneumomediastinum to suggest perforation or other complication status post intervention. No radiopaque foreign body.   Salivary glands: Parotid and submandibular glands are within normal limits.   Thyroid: Negative.   Lymph nodes: No enlarged or concerning adenopathy within the neck.   Vascular: Not well assessed due to lack of IV contrast. No significant atheromatous disease.   Limited intracranial: Unremarkable.   Visualized orbits: Prior bilateral ocular lens replacement. Otherwise unremarkable.   Mastoids and visualized paranasal sinuses: Mild mucoperiosteal thickening about the ethmoidal air cells. Paranasal sinuses are otherwise largely clear. Visualized mastoid no discrete or worrisome osseous lesions. Air cells and middle ear cavities are well pneumatized and free of fluid.   Skeleton: No discrete or worrisome osseous lesions.   Upper chest: Mild dependent atelectatic changes noted within the visualized lungs. Visualized lungs are otherwise clear. No pneumothorax.   Other: None.   Discharge Exam: Blood pressure (!) 95/53, pulse (!) 55, temperature 98.9 F (37.2 C), temperature source Axillary, resp. rate 16, height '5\' 4"'$  (1.626 m), weight 53.9 kg, SpO2 97 %.  General: elderly woman, intubated, no acute distress HENT: [place]/AT, ET tube in place, moist mucous membranes Lungs: clear to auscultation, no wheezing Cardiovascular: rrr, no murmurs Abdomen: soft, non-tender, non-distended, BS+ Extremities: warm, no edema Neuro: lightly sedated, moving all extremities GU: n/a  Disposition:  - Patient has been accepted in transfer to [facility] for further ENT evaluation - She is in stable condition for transfer - She will remain intubated for airway  protection  Allergies as of 01/27/2022  Reactions   Glucosamine    Other reaction(s): stomach upset   Oxycodone Hcl    Other reaction(s): nausea   Sulfa Antibiotics    Other reaction(s): childhood allergy        Medication List     STOP taking these medications    alendronate 70 MG tablet Commonly known as: FOSAMAX   augmented betamethasone dipropionate 0.05 % cream Commonly known as: DIPROLENE-AF   clonazePAM 0.5 MG tablet Commonly known as: KLONOPIN   escitalopram 10 MG tablet Commonly known as: LEXAPRO   guaiFENesin 600 MG 12 hr tablet Commonly known as: MUCINEX   loratadine 10 MG tablet Commonly known as: CLARITIN   multivitamin with minerals Tabs tablet   nabumetone 500 MG tablet Commonly known as: RELAFEN   zolpidem 10 MG tablet Commonly known as: AMBIEN       TAKE these medications    dextrose 5% lactated ringers 5 % infusion Inject 30 mL/hr into the vein continuous.   docusate 50 MG/5ML liquid Commonly known as: COLACE Place 10 mLs (100 mg total) into feeding tube 2 (two) times daily as needed for mild constipation.   fentaNYL 10 mcg/ml Soln infusion Inject 0-200 mcg/hr into the vein continuous.   hydrALAZINE 20 MG/ML injection Commonly known as: APRESOLINE Inject 0.5 mLs (10 mg total) into the vein every 4 (four) hours as needed (sbp > 160).   midazolam 2 MG/2ML Soln injection Commonly known as: VERSED Inject 1 mL (1 mg total) into the vein every 2 (two) hours as needed (to maintain RASS goal.).   ondansetron 4 MG/2ML Soln injection Commonly known as: ZOFRAN Inject 2 mLs (4 mg total) into the vein every 6 (six) hours as needed for nausea.   pantoprazole 40 MG injection Commonly known as: PROTONIX Inject 40 mg into the vein daily. Start taking on: January 28, 2022   polyethylene glycol 17 g packet Commonly known as: MIRALAX / GLYCOLAX Place 17 g into feeding tube daily as needed for moderate constipation. What changed:  how to  take this when to take this reasons to take this   propofol 1000 MG/100ML Emul injection Commonly known as: DIPRIVAN Inject 276.5-4,424 mcg/min into the vein continuous.         Signed: Freddi Starr 01/27/2022, 2:15 PM

## 2022-01-28 DIAGNOSIS — E878 Other disorders of electrolyte and fluid balance, not elsewhere classified: Secondary | ICD-10-CM | POA: Diagnosis not present

## 2022-01-28 DIAGNOSIS — R609 Edema, unspecified: Secondary | ICD-10-CM | POA: Diagnosis not present

## 2022-01-28 DIAGNOSIS — R9431 Abnormal electrocardiogram [ECG] [EKG]: Secondary | ICD-10-CM | POA: Diagnosis not present

## 2022-01-28 DIAGNOSIS — K3189 Other diseases of stomach and duodenum: Secondary | ICD-10-CM | POA: Diagnosis not present

## 2022-01-28 DIAGNOSIS — K225 Diverticulum of esophagus, acquired: Secondary | ICD-10-CM | POA: Diagnosis not present

## 2022-01-28 DIAGNOSIS — Z9911 Dependence on respirator [ventilator] status: Secondary | ICD-10-CM | POA: Diagnosis not present

## 2022-01-28 DIAGNOSIS — K2289 Other specified disease of esophagus: Secondary | ICD-10-CM | POA: Diagnosis not present

## 2022-01-28 DIAGNOSIS — T18128A Food in esophagus causing other injury, initial encounter: Secondary | ICD-10-CM | POA: Diagnosis not present

## 2022-01-28 DIAGNOSIS — I959 Hypotension, unspecified: Secondary | ICD-10-CM | POA: Diagnosis not present

## 2022-01-28 DIAGNOSIS — D62 Acute posthemorrhagic anemia: Secondary | ICD-10-CM | POA: Diagnosis not present

## 2022-01-28 DIAGNOSIS — R918 Other nonspecific abnormal finding of lung field: Secondary | ICD-10-CM | POA: Diagnosis not present

## 2022-01-28 DIAGNOSIS — T18108A Unspecified foreign body in esophagus causing other injury, initial encounter: Secondary | ICD-10-CM | POA: Diagnosis not present

## 2022-01-28 DIAGNOSIS — R1310 Dysphagia, unspecified: Secondary | ICD-10-CM | POA: Diagnosis not present

## 2022-01-28 DIAGNOSIS — K317 Polyp of stomach and duodenum: Secondary | ICD-10-CM | POA: Diagnosis not present

## 2022-01-28 DIAGNOSIS — A419 Sepsis, unspecified organism: Secondary | ICD-10-CM | POA: Diagnosis not present

## 2022-01-28 DIAGNOSIS — K449 Diaphragmatic hernia without obstruction or gangrene: Secondary | ICD-10-CM | POA: Diagnosis not present

## 2022-01-29 DIAGNOSIS — R0689 Other abnormalities of breathing: Secondary | ICD-10-CM | POA: Diagnosis not present

## 2022-01-29 DIAGNOSIS — Z9911 Dependence on respirator [ventilator] status: Secondary | ICD-10-CM | POA: Diagnosis not present

## 2022-01-29 DIAGNOSIS — K225 Diverticulum of esophagus, acquired: Secondary | ICD-10-CM | POA: Diagnosis not present

## 2022-01-29 DIAGNOSIS — J189 Pneumonia, unspecified organism: Secondary | ICD-10-CM | POA: Diagnosis not present

## 2022-01-29 DIAGNOSIS — R000 Tachycardia, unspecified: Secondary | ICD-10-CM | POA: Diagnosis not present

## 2022-01-29 DIAGNOSIS — J9811 Atelectasis: Secondary | ICD-10-CM | POA: Diagnosis not present

## 2022-01-29 DIAGNOSIS — J384 Edema of larynx: Secondary | ICD-10-CM | POA: Diagnosis not present

## 2022-01-29 NOTE — Anesthesia Postprocedure Evaluation (Signed)
Anesthesia Post Note  Patient: Christina Patton  Procedure(s) Performed: ESOPHAGOSCOPY (Throat)     Patient location during evaluation: SICU Anesthesia Type: General Level of consciousness: sedated Pain management: pain level controlled Vital Signs Assessment: post-procedure vital signs reviewed and stable Respiratory status: patient remains intubated per anesthesia plan Cardiovascular status: stable Postop Assessment: no apparent nausea or vomiting Anesthetic complications: no   No notable events documented.  Last Vitals:  Vitals:   01/27/22 1610 01/27/22 1615  BP: 118/60 (!) 104/51  Pulse: 60 63  Resp: 16 16  Temp:  37.2 C  SpO2: 96% 99%    Last Pain:  Vitals:   01/27/22 1113  TempSrc: Axillary  PainSc:                  Tiajuana Amass

## 2022-01-30 DIAGNOSIS — J189 Pneumonia, unspecified organism: Secondary | ICD-10-CM | POA: Diagnosis not present

## 2022-01-30 DIAGNOSIS — Z9911 Dependence on respirator [ventilator] status: Secondary | ICD-10-CM | POA: Diagnosis not present

## 2022-01-30 DIAGNOSIS — I4891 Unspecified atrial fibrillation: Secondary | ICD-10-CM | POA: Diagnosis not present

## 2022-01-30 DIAGNOSIS — K225 Diverticulum of esophagus, acquired: Secondary | ICD-10-CM | POA: Diagnosis not present

## 2022-01-30 DIAGNOSIS — R9431 Abnormal electrocardiogram [ECG] [EKG]: Secondary | ICD-10-CM | POA: Diagnosis not present

## 2022-01-30 DIAGNOSIS — K2970 Gastritis, unspecified, without bleeding: Secondary | ICD-10-CM | POA: Diagnosis not present

## 2022-01-30 DIAGNOSIS — T18128A Food in esophagus causing other injury, initial encounter: Secondary | ICD-10-CM | POA: Diagnosis not present

## 2022-01-30 DIAGNOSIS — Z4682 Encounter for fitting and adjustment of non-vascular catheter: Secondary | ICD-10-CM | POA: Diagnosis not present

## 2022-01-30 DIAGNOSIS — R0689 Other abnormalities of breathing: Secondary | ICD-10-CM | POA: Diagnosis not present

## 2022-01-31 DIAGNOSIS — T18128A Food in esophagus causing other injury, initial encounter: Secondary | ICD-10-CM | POA: Diagnosis not present

## 2022-01-31 DIAGNOSIS — J9811 Atelectasis: Secondary | ICD-10-CM | POA: Diagnosis not present

## 2022-02-04 NOTE — Anesthesia Postprocedure Evaluation (Signed)
Anesthesia Post Note  Patient: Christina Patton  Procedure(s) Performed: ESOPHAGOGASTRODUODENOSCOPY (EGD) FOREIGN BODY REMOVAL     Patient location during evaluation: Other Anesthesia Type: General Level of consciousness: sedated Pain management: pain level controlled Vital Signs Assessment: post-procedure vital signs reviewed and stable Respiratory status: patient remains intubated per anesthesia plan Cardiovascular status: stable Postop Assessment: no apparent nausea or vomiting Anesthetic complications: yes Comments: [facility] transport to [facility] from [place] Procedure room.   Encounter Notable Events  Notable Event Outcome Phase Comment  Difficult to intubate - unexpected  Intraprocedure Filed from anesthesia note documentation.    Last Vitals:  Vitals:   01/27/22 1610 01/27/22 1615  BP: 118/60 (!) 104/51  Pulse: 60 63  Resp: 16 16  Temp:  37.2 C  SpO2: 96% 99%    Last Pain:  Vitals:   01/27/22 1113  TempSrc: Axillary  PainSc:                  Santa Lighter

## 2022-02-05 ENCOUNTER — Ambulatory Visit: Payer: Medicare HMO

## 2022-02-06 DIAGNOSIS — T18128A Food in esophagus causing other injury, initial encounter: Secondary | ICD-10-CM | POA: Diagnosis not present

## 2022-02-06 DIAGNOSIS — R2689 Other abnormalities of gait and mobility: Secondary | ICD-10-CM | POA: Diagnosis not present

## 2022-02-06 DIAGNOSIS — Z9181 History of falling: Secondary | ICD-10-CM | POA: Diagnosis not present

## 2022-02-07 ENCOUNTER — Ambulatory Visit: Payer: Self-pay | Admitting: Physician Assistant

## 2022-02-08 ENCOUNTER — Ambulatory Visit: Payer: Medicare HMO

## 2022-02-08 DIAGNOSIS — K222 Esophageal obstruction: Secondary | ICD-10-CM | POA: Diagnosis not present

## 2022-02-08 DIAGNOSIS — M6281 Muscle weakness (generalized): Secondary | ICD-10-CM | POA: Diagnosis not present

## 2022-02-08 DIAGNOSIS — I10 Essential (primary) hypertension: Secondary | ICD-10-CM | POA: Diagnosis not present

## 2022-02-08 DIAGNOSIS — G4700 Insomnia, unspecified: Secondary | ICD-10-CM | POA: Diagnosis not present

## 2022-02-08 DIAGNOSIS — K225 Diverticulum of esophagus, acquired: Secondary | ICD-10-CM | POA: Diagnosis not present

## 2022-02-08 DIAGNOSIS — R69 Illness, unspecified: Secondary | ICD-10-CM | POA: Diagnosis not present

## 2022-02-08 DIAGNOSIS — R279 Unspecified lack of coordination: Secondary | ICD-10-CM | POA: Diagnosis not present

## 2022-02-08 DIAGNOSIS — R5381 Other malaise: Secondary | ICD-10-CM | POA: Diagnosis not present

## 2022-02-12 ENCOUNTER — Ambulatory Visit: Payer: Medicare HMO | Admitting: Physical Therapy

## 2022-02-12 DIAGNOSIS — G4700 Insomnia, unspecified: Secondary | ICD-10-CM | POA: Diagnosis not present

## 2022-02-15 ENCOUNTER — Ambulatory Visit: Payer: Medicare HMO

## 2022-02-18 DIAGNOSIS — K222 Esophageal obstruction: Secondary | ICD-10-CM | POA: Diagnosis not present

## 2022-02-19 ENCOUNTER — Ambulatory Visit: Payer: Medicare HMO | Admitting: Physical Therapy

## 2022-02-19 DIAGNOSIS — K222 Esophageal obstruction: Secondary | ICD-10-CM | POA: Diagnosis not present

## 2022-02-20 DIAGNOSIS — K222 Esophageal obstruction: Secondary | ICD-10-CM | POA: Diagnosis not present

## 2022-02-20 DIAGNOSIS — R1310 Dysphagia, unspecified: Secondary | ICD-10-CM | POA: Diagnosis not present

## 2022-02-20 DIAGNOSIS — R69 Illness, unspecified: Secondary | ICD-10-CM | POA: Diagnosis not present

## 2022-02-20 DIAGNOSIS — G4700 Insomnia, unspecified: Secondary | ICD-10-CM | POA: Diagnosis not present

## 2022-02-22 ENCOUNTER — Ambulatory Visit: Payer: Medicare HMO

## 2022-02-22 DIAGNOSIS — E440 Moderate protein-calorie malnutrition: Secondary | ICD-10-CM | POA: Diagnosis not present

## 2022-02-22 DIAGNOSIS — R1312 Dysphagia, oropharyngeal phase: Secondary | ICD-10-CM | POA: Diagnosis not present

## 2022-02-22 DIAGNOSIS — R531 Weakness: Secondary | ICD-10-CM | POA: Diagnosis not present

## 2022-02-22 DIAGNOSIS — R69 Illness, unspecified: Secondary | ICD-10-CM | POA: Diagnosis not present

## 2022-02-23 DIAGNOSIS — M6281 Muscle weakness (generalized): Secondary | ICD-10-CM | POA: Diagnosis not present

## 2022-02-23 DIAGNOSIS — T18128S Food in esophagus causing other injury, sequela: Secondary | ICD-10-CM | POA: Diagnosis not present

## 2022-02-23 DIAGNOSIS — K225 Diverticulum of esophagus, acquired: Secondary | ICD-10-CM | POA: Diagnosis not present

## 2022-02-23 DIAGNOSIS — R2689 Other abnormalities of gait and mobility: Secondary | ICD-10-CM | POA: Diagnosis not present

## 2022-02-23 DIAGNOSIS — M85851 Other specified disorders of bone density and structure, right thigh: Secondary | ICD-10-CM | POA: Diagnosis not present

## 2022-02-23 DIAGNOSIS — K209 Esophagitis, unspecified without bleeding: Secondary | ICD-10-CM | POA: Diagnosis not present

## 2022-02-23 DIAGNOSIS — K2970 Gastritis, unspecified, without bleeding: Secondary | ICD-10-CM | POA: Diagnosis not present

## 2022-02-23 DIAGNOSIS — G4700 Insomnia, unspecified: Secondary | ICD-10-CM | POA: Diagnosis not present

## 2022-02-23 DIAGNOSIS — R69 Illness, unspecified: Secondary | ICD-10-CM | POA: Diagnosis not present

## 2022-02-23 DIAGNOSIS — R1310 Dysphagia, unspecified: Secondary | ICD-10-CM | POA: Diagnosis not present

## 2022-02-23 DIAGNOSIS — K2270 Barrett's esophagus without dysplasia: Secondary | ICD-10-CM | POA: Diagnosis not present

## 2022-02-26 ENCOUNTER — Ambulatory Visit: Payer: Medicare HMO

## 2022-02-26 NOTE — Therapy (Signed)
[facility] [address] [place], [place], [address] Phone: [phone]   Fax:  [phone]  Patient Details  Name: Christina Patton MRN: [id number] Date of Birth: 1946-04-10 Referring Provider:  No ref. provider found  Encounter Date: 02/26/2022 PHYSICAL THERAPY DISCHARGE SUMMARY  Visits from Start of Care: 1  Current functional level related to goals / functional outcomes: Evaluation performed, pt admitted to hospital and not returning   Remaining deficits:    Education / Equipment:    Patient agrees to discharge. Patient goals were not met. Patient is being discharged due to a change in medical status.   Toniann Fail, PT 02/26/2022, 10:44 AM  [facility] [address], [place], [place], [address] Phone: [phone]   Fax:  [phone]

## 2022-02-27 DIAGNOSIS — F411 Generalized anxiety disorder: Secondary | ICD-10-CM | POA: Diagnosis not present

## 2022-02-27 DIAGNOSIS — K225 Diverticulum of esophagus, acquired: Secondary | ICD-10-CM | POA: Diagnosis not present

## 2022-02-27 DIAGNOSIS — I10 Essential (primary) hypertension: Secondary | ICD-10-CM | POA: Diagnosis not present

## 2022-02-27 DIAGNOSIS — R2689 Other abnormalities of gait and mobility: Secondary | ICD-10-CM | POA: Diagnosis not present

## 2022-02-28 DIAGNOSIS — R2689 Other abnormalities of gait and mobility: Secondary | ICD-10-CM | POA: Diagnosis not present

## 2022-03-01 ENCOUNTER — Ambulatory Visit: Payer: Medicare HMO | Admitting: Physical Therapy

## 2022-03-01 DIAGNOSIS — R2689 Other abnormalities of gait and mobility: Secondary | ICD-10-CM | POA: Diagnosis not present

## 2022-03-02 DIAGNOSIS — R2689 Other abnormalities of gait and mobility: Secondary | ICD-10-CM | POA: Diagnosis not present

## 2022-03-05 ENCOUNTER — Emergency Department (HOSPITAL_COMMUNITY): Payer: Medicare HMO

## 2022-03-05 ENCOUNTER — Emergency Department (HOSPITAL_COMMUNITY)
Admission: EM | Admit: 2022-03-05 | Discharge: 2022-03-05 | Disposition: A | Discharge status: Skilled Nursing Facility | Payer: Medicare HMO | Attending: Emergency Medicine | Admitting: Emergency Medicine

## 2022-03-05 ENCOUNTER — Telehealth: Payer: Self-pay | Admitting: Surgical

## 2022-03-05 ENCOUNTER — Ambulatory Visit: Payer: Medicare HMO

## 2022-03-05 ENCOUNTER — Telehealth (HOSPITAL_COMMUNITY): Payer: Self-pay | Admitting: Emergency Medicine

## 2022-03-05 ENCOUNTER — Encounter (HOSPITAL_COMMUNITY): Payer: Self-pay

## 2022-03-05 DIAGNOSIS — M25571 Pain in right ankle and joints of right foot: Secondary | ICD-10-CM | POA: Diagnosis not present

## 2022-03-05 DIAGNOSIS — W010XXA Fall on same level from slipping, tripping and stumbling without subsequent striking against object, initial encounter: Secondary | ICD-10-CM | POA: Insufficient documentation

## 2022-03-05 DIAGNOSIS — S82151A Displaced fracture of right tibial tuberosity, initial encounter for closed fracture: Secondary | ICD-10-CM | POA: Diagnosis not present

## 2022-03-05 DIAGNOSIS — S82154A Nondisplaced fracture of right tibial tuberosity, initial encounter for closed fracture: Secondary | ICD-10-CM

## 2022-03-05 DIAGNOSIS — M25551 Pain in right hip: Secondary | ICD-10-CM | POA: Diagnosis not present

## 2022-03-05 DIAGNOSIS — R609 Edema, unspecified: Secondary | ICD-10-CM | POA: Diagnosis not present

## 2022-03-05 DIAGNOSIS — S82191A Other fracture of upper end of right tibia, initial encounter for closed fracture: Secondary | ICD-10-CM | POA: Diagnosis not present

## 2022-03-05 DIAGNOSIS — M2578 Osteophyte, vertebrae: Secondary | ICD-10-CM | POA: Diagnosis not present

## 2022-03-05 DIAGNOSIS — Y92129 Unspecified place in nursing home as the place of occurrence of the external cause: Secondary | ICD-10-CM | POA: Diagnosis not present

## 2022-03-05 DIAGNOSIS — R531 Weakness: Secondary | ICD-10-CM | POA: Diagnosis not present

## 2022-03-05 DIAGNOSIS — W19XXXA Unspecified fall, initial encounter: Secondary | ICD-10-CM | POA: Diagnosis not present

## 2022-03-05 DIAGNOSIS — S8991XA Unspecified injury of right lower leg, initial encounter: Secondary | ICD-10-CM | POA: Diagnosis present

## 2022-03-05 DIAGNOSIS — M79604 Pain in right leg: Secondary | ICD-10-CM | POA: Diagnosis not present

## 2022-03-05 DIAGNOSIS — Z743 Need for continuous supervision: Secondary | ICD-10-CM | POA: Diagnosis not present

## 2022-03-05 DIAGNOSIS — Z043 Encounter for examination and observation following other accident: Secondary | ICD-10-CM | POA: Diagnosis not present

## 2022-03-05 MED ORDER — HYDROCODONE-ACETAMINOPHEN 5-325 MG PO TABS: 1.0000 | ORAL_TABLET | Freq: Four times a day (QID) | ORAL | 0 refills | Status: DC | PRN

## 2022-03-05 MED ORDER — HYDROCODONE-ACETAMINOPHEN 5-325 MG PO TABS
1.0000 | ORAL_TABLET | Freq: Once | ORAL | Status: AC
  Administered 2022-03-05: 1 via ORAL
  Filled 2022-03-05: qty 1

## 2022-03-05 MED ORDER — HYDROCODONE-ACETAMINOPHEN 5-325 MG PO TABS: 1.0000 | ORAL_TABLET | Freq: Four times a day (QID) | ORAL | 0 refills | Status: AC | PRN

## 2022-03-05 NOTE — ED Provider Notes (Signed)
[facility] DEPT Provider Note   CSN: [id number] Arrival date & time: 03/05/22  0846     History PMH: Zenkers Diverticulum, Osteoporosis, Anxiety Chief Complaint  Patient presents with   Fall   Knee Pain    right    Christina Patton is a 76 y.o. female. Patient presents after a fall that she had last night. She tripped over the ledge between carpet and hardwood flooring with her sketchers on. She fell forward onto her right knee. She has been unable to bear weight since then. She has been getting around with the help of the aides where she lives  She is currently living at [facility] for rehab as she was recently hospitalized for a zenker Diverticulum and ended up having a lot of deconditioning. She has finally gotten to the point where she can walk around her apartment without help or sometimes using a cane.   She states that most of her pain is in the right knee and right ankle. She does have some right hip discomfort that is not as severe. She has not been able to take any medication for pain. She denies any other injuries including hitting her head. She is not on blood thinners.    Fall  Knee Pain      Home Medications Prior to Admission medications   Medication Sig Start Date End Date Taking? Authorizing Provider  Dextrose in Lactated Ringers (DEXTROSE 5% LACTATED RINGERS) 5 % infusion Inject 30 mL/hr into the vein continuous. 01/27/22   Freddi Starr, MD  docusate (COLACE) 50 MG/5ML liquid Place 10 mLs (100 mg total) into feeding tube 2 (two) times daily as needed for mild constipation. 01/27/22   Freddi Starr, MD  fentaNYL 10 mcg/ml SOLN infusion Inject 0-200 mcg/hr into the vein continuous. 01/27/22   Freddi Starr, MD  hydrALAZINE (APRESOLINE) 20 MG/ML injection Inject 0.5 mLs (10 mg total) into the vein every 4 (four) hours as needed (sbp > 160). 01/27/22   Freddi Starr, MD  HYDROcodone-acetaminophen (NORCO/VICODIN)  5-325 MG tablet Take 1 tablet by mouth every 6 (six) hours as needed for up to 5 days for moderate pain or severe pain. 03/05/22 03/10/22  Katlynne Mckercher, Adora Fridge, PA-C  midazolam (VERSED) 2 MG/2ML SOLN injection Inject 1 mL (1 mg total) into the vein every 2 (two) hours as needed (to maintain RASS goal.). 01/27/22   Freddi Starr, MD  ondansetron (ZOFRAN) 4 MG/2ML SOLN injection Inject 2 mLs (4 mg total) into the vein every 6 (six) hours as needed for nausea. 01/27/22   Freddi Starr, MD  pantoprazole (PROTONIX) 40 MG injection Inject 40 mg into the vein daily. 01/28/22   Freddi Starr, MD  polyethylene glycol (MIRALAX / GLYCOLAX) 17 g packet Place 17 g into feeding tube daily as needed for moderate constipation. 01/27/22   Freddi Starr, MD  propofol (DIPRIVAN) 1000 MG/100ML EMUL injection Inject 276.5-4,424 mcg/min into the vein continuous. 01/27/22   Freddi Starr, MD      Allergies    Glucosamine, Oxycodone hcl, and Sulfa antibiotics    Review of Systems   Review of Systems  Musculoskeletal:  Positive for arthralgias, gait problem and joint swelling.  All other systems reviewed and are negative.   Physical Exam Updated Vital Signs BP 135/71 (BP Location: Right Arm)   Pulse 62   Temp 98.7 F (37.1 C) (Oral)   Resp 16   Ht '5\' 4"'$  (1.626 m)  Wt 55.3 kg   SpO2 98%   BMI 20.94 kg/m  Physical Exam Vitals and nursing note reviewed.  Constitutional:      General: She is not in acute distress.    Appearance: Normal appearance. She is well-developed. She is not ill-appearing, toxic-appearing or diaphoretic.  HENT:     Head: Normocephalic and atraumatic.     Nose: No nasal deformity.     Mouth/Throat:     Lips: Pink. No lesions.  Eyes:     General: Gaze aligned appropriately. No scleral icterus.       Right eye: No discharge.        Left eye: No discharge.     Conjunctiva/sclera: Conjunctivae normal.     Right eye: Right conjunctiva is not injected. No exudate or  hemorrhage.    Left eye: Left conjunctiva is not injected. No exudate or hemorrhage. Pulmonary:     Effort: Pulmonary effort is normal. No respiratory distress.  Musculoskeletal:     Comments: Right hip without deformity. Not tender to touch. Able to passively range Right knee with mild to moderate swelling. Very tender to the touch of the distal femur, lateral knee, and proximal tib/fib. Limited knee ROM. Right ankle with mild tenderness at the distal fibula. Has good ROM of ankle.   Pedal pulse 2+ Sensation intact.   Skin:    General: Skin is warm and dry.  Neurological:     Mental Status: She is alert and oriented to person, place, and time.  Psychiatric:        Mood and Affect: Mood normal.        Speech: Speech normal.        Behavior: Behavior normal. Behavior is cooperative.     ED Results / Procedures / Treatments   Labs (all labs ordered are listed, but only abnormal results are displayed) Labs Reviewed - No data to display  EKG None  Radiology DG Ankle Complete Right  Result Date: 03/05/2022 CLINICAL DATA:  Mechanical fall last night. Right knee and ankle pain. EXAM: RIGHT ANKLE - COMPLETE 3+ VIEW COMPARISON:  Right foot radiographs 10/02/2021 FINDINGS: Mildly decreased bone mineralization. The ankle mortise is symmetric and intact. Mild distal medial malleolar degenerative osteophytosis. Mild dorsal talar head-neck junction degenerative osteophytosis. No acute fracture or dislocation. IMPRESSION: No acute fracture. Electronically Signed   By: Yvonne Kendall M.D.   On: 03/05/2022 10:08   DG Hip Unilat W or Wo Pelvis 2-3 Views Right  Result Date: 03/05/2022 CLINICAL DATA:  Mechanical fall last night. EXAM: DG HIP (WITH OR WITHOUT PELVIS) 2-3V RIGHT COMPARISON:  None Available. FINDINGS: Mildly decreased bone mineralization. Minimal bilateral superior femoroacetabular joint space narrowing with mild bilateral superolateral acetabular degenerative osteophytosis. Mild  calcification lateral to the superior left acetabulum. No acute fracture is seen. No dislocation. The bilateral sacroiliac and pubic symphysis joint spaces are maintained. Moderate to severe right L4-5 disc space narrowing with endplate sclerosis and peripheral osteophytosis. IMPRESSION: There is mild bilateral femoroacetabular osteoarthritis. No acute fracture is seen. Electronically Signed   By: Yvonne Kendall M.D.   On: 03/05/2022 10:01   DG Knee Complete 4 Views Right  Result Date: 03/05/2022 CLINICAL DATA:  Mechanical fall lastly.  Swelling at the knee. EXAM: RIGHT KNEE - COMPLETE 4+ VIEW COMPARISON:  Right knee radiographs 01/04/2021 FINDINGS: There is a predominantly oblique linear lucency extending from the superior aspect of the medial tibial spine inferiorly and laterally through the proximal tibia to the lateral proximal tibial  metadiaphyseal cortex. No displacement. Mild-to-moderate medial compartment joint space narrowing. Small joint effusion is slightly increased from prior. Minimal chronic enthesopathic change at the patellar insertion of the tibial tubercle, unchanged. IMPRESSION: Acute, nondisplaced fracture extending from the superior aspect of the medial tibial spine through the proximal lateral tibial metadiaphyseal cortex. Electronically Signed   By: Yvonne Kendall M.D.   On: 03/05/2022 09:59    Procedures Procedures   Medications Ordered in ED Medications  HYDROcodone-acetaminophen (NORCO/VICODIN) 5-325 MG per tablet 1 tablet (1 tablet Oral Given 03/05/22 0955)    ED Course/ Medical Decision Making/ A&P Clinical Course as of 03/05/22 1501  Mon Mar 05, 2022  1005 + medial to lateral tibial plateau fracture [GL]  1022 She is a resident of [facility] on [address] [GL]  1030 Confirmed pharmacy with nursing facility staff as [facility] in [place] [GL] Sherre Poot, Adora Fridge, PA-C                           Medical Decision  Making Amount and/or Complexity of Data Reviewed Radiology: ordered.  Risk Prescription drug management.    MDM  This is a 76 y.o. female who presents to the ED with mechanical fall last night  Initial Impression  Appears well. Vitals Stable. She has not been ambulatory and most pain is surrounding the right knee, but also has some more mild aches of the right hip and ankle. She has some swelling on exam in the right knee as well as limited ROM. She is neurovascularly intact. Xray of hip, knee, and ankle obtained.  Norco for pain.  I personally ordered, reviewed, and interpreted all laboratory work and imaging and agree with radiologist interpretation. Results interpreted below: no ankle fracture. Hip xray with degenerative changes, no acute findings Right knee xray reveals acute, nondisplaced fracture extending from the superior aspect of the medial tibial spine through the proximal  lateral tibial metadiaphyseal cortex.    Assessment/Plan:  She has a tibial plateau fracture. I informed patient of diagnosis. She is placed in a knee immobilizer. She should be nonweightbearing. She feels safe going home and is able to get around with the help of her walker. She also has plenty of aides to help her at her facility. She is instructed to follow up with orthopedics and schedule an appointment over the next week.  She is stable for discharge and provided return precautions for sx of compartment syndrome.    Charting Requirements Additional history is obtained from:  Independent historian External Records from outside source obtained and reviewed including: Prior Orthopedic note Social Determinants of Health:  none Pertinant PMH that complicates patient's illness: n/a  Patient Care Problems that were addressed during this visit: - Closed nondisplaced fracture of the right tibial tuberosity: Acute illness with complication This patient was maintained on a cardiac monitor/telemetry. I  personally viewed and interpreted the cardiac monitor which reveals an underlying rhythm of NSR Medications given in ED: Norco Reevaluation of the patient after these medicines showed that the patient improved I have reviewed home medications and made changes accordingly.  Critical Care Interventions: n/a Consultations: n/a Disposition: discharge  This is a shared visit with my attending physician, Dr. Tamera Punt.  We have discussed this patient and they have independently evaluated this patient. The plan was altered or changed as needed.  Portions of this note were generated with [employer]. Dictation errors  may occur despite best attempts at proofreading.     Final Clinical Impression(s) / ED Diagnoses Final diagnoses:  Closed nondisplaced fracture of right tibial tuberosity, initial encounter    Rx / DC Orders ED Discharge Orders          Ordered    HYDROcodone-acetaminophen (NORCO/VICODIN) 5-325 MG tablet  Every 6 hours PRN,   Status:  Discontinued        03/05/22 1033              Mathieu Schloemer, Adora Fridge, PA-C 03/05/22 1501    Malvin Johns, MD 03/05/22 [phone]

## 2022-03-05 NOTE — Telephone Encounter (Signed)
Changing pharmacy to [facility] in [place]. Resent Norco prescription

## 2022-03-05 NOTE — ED Triage Notes (Signed)
Pt BIB [facility] from [facility] for mechanical fall last night. C/o right knee and ankle pain, knee worse. Unable to ambulate and hurts with ankle movement. Some swelling at knee. States she asked for pain medication and ice last night but did not receive. No additional complaints.  134/80 HR 64 96% RA

## 2022-03-05 NOTE — Discharge Instructions (Signed)
You have a fracture of your tibial plateau. We have placed you in a knee immobilizer and you need to stay off of your right leg.  Please call [facility] today to schedule your follow up appointment. I have the phone number included in your paperwork.  I have sent your pain prescription to your pharmacy. Please take this as needed for pain.   Please return to the ED if you develop worsening pain or swelling that is atypical for what you have already been experiencing.

## 2022-03-05 NOTE — ED Notes (Signed)
X-ray at bedside

## 2022-03-06 ENCOUNTER — Ambulatory Visit: Payer: Medicare HMO | Admitting: Physician Assistant

## 2022-03-06 ENCOUNTER — Encounter: Payer: Self-pay | Admitting: Physician Assistant

## 2022-03-06 DIAGNOSIS — M6281 Muscle weakness (generalized): Secondary | ICD-10-CM | POA: Diagnosis not present

## 2022-03-06 DIAGNOSIS — S82141A Displaced bicondylar fracture of right tibia, initial encounter for closed fracture: Secondary | ICD-10-CM | POA: Diagnosis not present

## 2022-03-06 DIAGNOSIS — Z9181 History of falling: Secondary | ICD-10-CM | POA: Diagnosis not present

## 2022-03-06 DIAGNOSIS — R1314 Dysphagia, pharyngoesophageal phase: Secondary | ICD-10-CM | POA: Diagnosis not present

## 2022-03-06 NOTE — Progress Notes (Signed)
Office Visit Note   Patient: Christina Patton           Date of Birth: 1945-09-03           MRN: [id number] Visit Date: 03/06/2022              Requested by: Cari Caraway, MD [place],  [place] [address] PCP: Cari Caraway, MD   Right knee pain   HPI: Patient is a pleasant 76 year old woman who is accompanied by her daughter.  She lives in an assisted living facility.  She normally ambulates without an assistive device although recently she has been told that she should be using a walker or cane.  She is 1 day status post tripping on some carpet and falling onto her right leg.  She was taken to the emergency room where she complained of right knee and right ankle pain.  Right ankle was benign.  Unfortunately the right knee did demonstrate a nondisplaced fracture of the tibial spine into the medial tibial plateau.  It was completely nondisplaced.  She was placed in a knee immobilizer and follows up today.  Assessment & Plan: Visit Diagnoses:  1. Tibial plateau fracture, right, closed, initial encounter     Plan: I reviewed the patient with Dr. Erlinda Hong.  He has recommended conservative care with the hinged knee brace so she can move her knee but she is to remain nonweightbearing.  I recommend if she is able to take a baby aspirin today to the event deep vein thrombosis.  She should follow-up in 2 weeks at which time new x-rays of her knee should be taken  Follow-Up Instructions: No follow-ups on file.   Ortho Exam  Patient is alert, oriented, no adenopathy, well-dressed, normal affect, normal respiratory effort. Examination of her right knee she has some mild effusion.  No redness or cellulitis.  She is focally tender over the medial tibial plateau.  Distal pulses are intact calves are soft and nontender sensation is intact  Imaging: No results found. No images are attached to the encounter.  Labs: No results found for: "HGBA1C", "ESRSEDRATE", "CRP", "LABURIC",  "REPTSTATUS", "GRAMSTAIN", "CULT", "LABORGA"   Lab Results  Component Value Date   ALBUMIN 4.1 01/26/2022    No results found for: "MG" No results found for: "VD25OH"  No results found for: "PREALBUMIN"    Latest Ref Rng & Units 01/26/2022    2:58 AM 01/26/2022    1:02 AM  CBC EXTENDED  WBC 4.0 - 10.5 K/uL 14.4    RBC 3.87 - 5.11 MIL/uL 4.96    Hemoglobin 12.0 - 15.0 g/dL 16.0  15.6   HCT 36.0 - 46.0 % 45.5  46.0   Platelets 150 - 400 K/uL 196    NEUT# 1.7 - 7.7 K/uL 13.0    Lymph# 0.7 - 4.0 K/uL 0.8       There is no height or weight on file to calculate BMI.  Orders:  No orders of the defined types were placed in this encounter.  No orders of the defined types were placed in this encounter.    Procedures: No procedures performed  Clinical Data: No additional findings.  ROS:  All other systems negative, except as noted in the HPI. Review of Systems  Objective: Vital Signs: There were no vitals taken for this visit.  Specialty Comments:  No specialty comments available.  PMFS History: Patient Active Problem List   Diagnosis Date Noted   Tibial plateau fracture, right,  closed, initial encounter 03/06/2022   Endotracheally intubated 01/27/2022   Food impaction of esophagus 01/25/2022   Esophageal diverticulum 01/25/2022   Hemoptysis 09/12/2016   Past Medical History:  Diagnosis Date   Anxiety    Cancer ([facility])    skin cancer on face   Osteoporosis    Zenker's diverticulum     Family History  Problem Relation Age of Onset   Ovarian cancer Mother    Breast cancer Neg Hx     Past Surgical History:  Procedure Laterality Date   ESOPHAGOGASTRODUODENOSCOPY N/A 01/25/2022   Procedure: ESOPHAGOGASTRODUODENOSCOPY (EGD);  Surgeon: Wilford Corner, MD;  Location: [facility];  Service: Gastroenterology;  Laterality: N/A;   ESOPHAGOSCOPY N/A 01/25/2022   Procedure: ESOPHAGOSCOPY;  Surgeon: Boyce Medici., MD;  Location: [facility] OR;  Service: ENT;   Laterality: N/A;   FOREIGN BODY REMOVAL  01/25/2022   Procedure: FOREIGN BODY REMOVAL;  Surgeon: Wilford Corner, MD;  Location: [facility]COPY;  Service: Gastroenterology;;   Stanford Breed DIVERTICULECTOMY     operated/repaired   Social History   Occupational History   Not on file  Tobacco Use   Smoking status: Never   Smokeless tobacco: Never  Substance and Sexual Activity   Alcohol use: Never   Drug use: Not on file   Sexual activity: Not on file

## 2022-03-08 ENCOUNTER — Ambulatory Visit: Payer: Medicare HMO

## 2022-03-08 DIAGNOSIS — R2689 Other abnormalities of gait and mobility: Secondary | ICD-10-CM | POA: Diagnosis not present

## 2022-03-08 DIAGNOSIS — R1314 Dysphagia, pharyngoesophageal phase: Secondary | ICD-10-CM | POA: Diagnosis not present

## 2022-03-08 DIAGNOSIS — M6281 Muscle weakness (generalized): Secondary | ICD-10-CM | POA: Diagnosis not present

## 2022-03-08 DIAGNOSIS — Z9181 History of falling: Secondary | ICD-10-CM | POA: Diagnosis not present

## 2022-03-08 DIAGNOSIS — R279 Unspecified lack of coordination: Secondary | ICD-10-CM | POA: Diagnosis not present

## 2022-03-09 DIAGNOSIS — R1314 Dysphagia, pharyngoesophageal phase: Secondary | ICD-10-CM | POA: Diagnosis not present

## 2022-03-09 DIAGNOSIS — M6281 Muscle weakness (generalized): Secondary | ICD-10-CM | POA: Diagnosis not present

## 2022-03-09 DIAGNOSIS — R279 Unspecified lack of coordination: Secondary | ICD-10-CM | POA: Diagnosis not present

## 2022-03-09 DIAGNOSIS — R2689 Other abnormalities of gait and mobility: Secondary | ICD-10-CM | POA: Diagnosis not present

## 2022-03-12 DIAGNOSIS — R279 Unspecified lack of coordination: Secondary | ICD-10-CM | POA: Diagnosis not present

## 2022-03-12 DIAGNOSIS — R2689 Other abnormalities of gait and mobility: Secondary | ICD-10-CM | POA: Diagnosis not present

## 2022-03-12 DIAGNOSIS — M6281 Muscle weakness (generalized): Secondary | ICD-10-CM | POA: Diagnosis not present

## 2022-03-13 DIAGNOSIS — Z9181 History of falling: Secondary | ICD-10-CM | POA: Diagnosis not present

## 2022-03-13 DIAGNOSIS — M6281 Muscle weakness (generalized): Secondary | ICD-10-CM | POA: Diagnosis not present

## 2022-03-14 DIAGNOSIS — R279 Unspecified lack of coordination: Secondary | ICD-10-CM | POA: Diagnosis not present

## 2022-03-14 DIAGNOSIS — R2689 Other abnormalities of gait and mobility: Secondary | ICD-10-CM | POA: Diagnosis not present

## 2022-03-14 DIAGNOSIS — M6281 Muscle weakness (generalized): Secondary | ICD-10-CM | POA: Diagnosis not present

## 2022-03-14 DIAGNOSIS — R1314 Dysphagia, pharyngoesophageal phase: Secondary | ICD-10-CM | POA: Diagnosis not present

## 2022-03-15 DIAGNOSIS — Z9181 History of falling: Secondary | ICD-10-CM | POA: Diagnosis not present

## 2022-03-15 DIAGNOSIS — R1314 Dysphagia, pharyngoesophageal phase: Secondary | ICD-10-CM | POA: Diagnosis not present

## 2022-03-15 DIAGNOSIS — M6281 Muscle weakness (generalized): Secondary | ICD-10-CM | POA: Diagnosis not present

## 2022-03-16 DIAGNOSIS — R2689 Other abnormalities of gait and mobility: Secondary | ICD-10-CM | POA: Diagnosis not present

## 2022-03-16 DIAGNOSIS — M6281 Muscle weakness (generalized): Secondary | ICD-10-CM | POA: Diagnosis not present

## 2022-03-16 DIAGNOSIS — R279 Unspecified lack of coordination: Secondary | ICD-10-CM | POA: Diagnosis not present

## 2022-03-19 DIAGNOSIS — M6281 Muscle weakness (generalized): Secondary | ICD-10-CM | POA: Diagnosis not present

## 2022-03-19 DIAGNOSIS — R279 Unspecified lack of coordination: Secondary | ICD-10-CM | POA: Diagnosis not present

## 2022-03-19 DIAGNOSIS — R2689 Other abnormalities of gait and mobility: Secondary | ICD-10-CM | POA: Diagnosis not present

## 2022-03-20 ENCOUNTER — Encounter: Payer: Self-pay | Admitting: Physician Assistant

## 2022-03-20 ENCOUNTER — Ambulatory Visit: Payer: Medicare HMO | Admitting: Physician Assistant

## 2022-03-20 ENCOUNTER — Emergency Department (HOSPITAL_COMMUNITY): Payer: Medicare HMO

## 2022-03-20 ENCOUNTER — Ambulatory Visit (INDEPENDENT_AMBULATORY_CARE_PROVIDER_SITE_OTHER): Payer: Medicare HMO

## 2022-03-20 ENCOUNTER — Emergency Department (HOSPITAL_COMMUNITY)
Admission: EM | Admit: 2022-03-20 | Discharge: 2022-03-20 | Disposition: A | Discharge status: Home / Self Care | Payer: Medicare HMO | Attending: Emergency Medicine | Admitting: Emergency Medicine

## 2022-03-20 ENCOUNTER — Other Ambulatory Visit: Payer: Self-pay

## 2022-03-20 ENCOUNTER — Encounter (HOSPITAL_COMMUNITY): Payer: Self-pay

## 2022-03-20 ENCOUNTER — Ambulatory Visit (HOSPITAL_BASED_OUTPATIENT_CLINIC_OR_DEPARTMENT_OTHER)
Admission: RE | Admit: 2022-03-20 | Discharge: 2022-03-20 | Disposition: A | Discharge status: Home / Self Care | Payer: Medicare HMO | Source: Ambulatory Visit | Attending: Physician Assistant | Admitting: Physician Assistant

## 2022-03-20 ENCOUNTER — Other Ambulatory Visit: Payer: Self-pay | Admitting: *Deleted

## 2022-03-20 DIAGNOSIS — S82141A Displaced bicondylar fracture of right tibia, initial encounter for closed fracture: Secondary | ICD-10-CM | POA: Diagnosis not present

## 2022-03-20 DIAGNOSIS — R600 Localized edema: Secondary | ICD-10-CM | POA: Diagnosis not present

## 2022-03-20 DIAGNOSIS — Z8673 Personal history of transient ischemic attack (TIA), and cerebral infarction without residual deficits: Secondary | ICD-10-CM | POA: Diagnosis not present

## 2022-03-20 DIAGNOSIS — I82491 Acute embolism and thrombosis of other specified deep vein of right lower extremity: Secondary | ICD-10-CM | POA: Diagnosis not present

## 2022-03-20 DIAGNOSIS — G9389 Other specified disorders of brain: Secondary | ICD-10-CM | POA: Diagnosis not present

## 2022-03-20 DIAGNOSIS — G911 Obstructive hydrocephalus: Secondary | ICD-10-CM

## 2022-03-20 DIAGNOSIS — Z20822 Contact with and (suspected) exposure to covid-19: Secondary | ICD-10-CM | POA: Diagnosis not present

## 2022-03-20 DIAGNOSIS — X58XXXA Exposure to other specified factors, initial encounter: Secondary | ICD-10-CM | POA: Insufficient documentation

## 2022-03-20 DIAGNOSIS — R9431 Abnormal electrocardiogram [ECG] [EKG]: Secondary | ICD-10-CM | POA: Diagnosis not present

## 2022-03-20 DIAGNOSIS — G319 Degenerative disease of nervous system, unspecified: Secondary | ICD-10-CM | POA: Diagnosis not present

## 2022-03-20 DIAGNOSIS — I824Y1 Acute embolism and thrombosis of unspecified deep veins of right proximal lower extremity: Secondary | ICD-10-CM | POA: Diagnosis not present

## 2022-03-20 DIAGNOSIS — M7989 Other specified soft tissue disorders: Secondary | ICD-10-CM | POA: Diagnosis present

## 2022-03-20 DIAGNOSIS — Z79899 Other long term (current) drug therapy: Secondary | ICD-10-CM | POA: Diagnosis not present

## 2022-03-20 LAB — I-STAT CHEM 8, ED
BUN: 11 mg/dL (ref 8–23)
Calcium, Ion: 1.09 mmol/L — ABNORMAL LOW (ref 1.15–1.40)
Chloride: 99 mmol/L (ref 98–111)
Creatinine, Ser: 0.5 mg/dL (ref 0.44–1.00)
Glucose, Bld: 101 mg/dL — ABNORMAL HIGH (ref 70–99)
HCT: 38 % (ref 36.0–46.0)
Hemoglobin: 12.9 g/dL (ref 12.0–15.0)
Potassium: 3.4 mmol/L — ABNORMAL LOW (ref 3.5–5.1)
Sodium: 136 mmol/L (ref 135–145)
TCO2: 28 mmol/L (ref 22–32)

## 2022-03-20 LAB — CBC WITH DIFFERENTIAL/PLATELET
Abs Immature Granulocytes: 0.06 10*3/uL (ref 0.00–0.07)
Basophils Absolute: 0 10*3/uL (ref 0.0–0.1)
Basophils Relative: 0 %
Eosinophils Absolute: 0.1 10*3/uL (ref 0.0–0.5)
Eosinophils Relative: 1 %
HCT: 38.8 % (ref 36.0–46.0)
Hemoglobin: 12.7 g/dL (ref 12.0–15.0)
Immature Granulocytes: 1 %
Lymphocytes Relative: 13 %
Lymphs Abs: 1.3 10*3/uL (ref 0.7–4.0)
MCH: 30.8 pg (ref 26.0–34.0)
MCHC: 32.7 g/dL (ref 30.0–36.0)
MCV: 94.2 fL (ref 80.0–100.0)
Monocytes Absolute: 1 10*3/uL (ref 0.1–1.0)
Monocytes Relative: 11 %
Neutro Abs: 7 10*3/uL (ref 1.7–7.7)
Neutrophils Relative %: 74 %
Platelets: 313 10*3/uL (ref 150–400)
RBC: 4.12 MIL/uL (ref 3.87–5.11)
RDW: 12.1 % (ref 11.5–15.5)
WBC: 9.4 10*3/uL (ref 4.0–10.5)
nRBC: 0 % (ref 0.0–0.2)

## 2022-03-20 LAB — BASIC METABOLIC PANEL
Anion gap: 12 (ref 5–15)
BUN: 10 mg/dL (ref 8–23)
CO2: 25 mmol/L (ref 22–32)
Calcium: 9 mg/dL (ref 8.9–10.3)
Chloride: 100 mmol/L (ref 98–111)
Creatinine, Ser: 0.69 mg/dL (ref 0.44–1.00)
GFR, Estimated: >60 mL/min (ref >60)
Glucose, Bld: 105 mg/dL — ABNORMAL HIGH (ref 70–99)
Potassium: 3.4 mmol/L — ABNORMAL LOW (ref 3.5–5.1)
Sodium: 137 mmol/L (ref 135–145)

## 2022-03-20 LAB — RAPID URINE DRUG SCREEN, HOSP PERFORMED
Amphetamines: NOT DETECTED
Barbiturates: NOT DETECTED
Benzodiazepines: NOT DETECTED
Cocaine: NOT DETECTED
Opiates: NOT DETECTED
Tetrahydrocannabinol: NOT DETECTED

## 2022-03-20 LAB — URINALYSIS, ROUTINE W REFLEX MICROSCOPIC
Bilirubin Urine: NEGATIVE
Glucose, UA: NEGATIVE mg/dL
Hgb urine dipstick: NEGATIVE
Ketones, ur: 20 mg/dL — AB
Leukocytes,Ua: NEGATIVE
Nitrite: NEGATIVE
Protein, ur: NEGATIVE mg/dL
Specific Gravity, Urine: 1.014 (ref 1.005–1.030)
pH: 6 (ref 5.0–8.0)

## 2022-03-20 LAB — RESP PANEL BY RT-PCR (FLU A&B, COVID) ARPGX2
Influenza A by PCR: NEGATIVE
Influenza B by PCR: NEGATIVE
SARS Coronavirus 2 by RT PCR: NEGATIVE

## 2022-03-20 LAB — BRAIN NATRIURETIC PEPTIDE: B Natriuretic Peptide: 58.6 pg/mL (ref 0.0–100.0)

## 2022-03-20 LAB — PROTIME-INR
INR: 1 (ref 0.8–1.2)
Prothrombin Time: 13.6 seconds (ref 11.4–15.2)

## 2022-03-20 LAB — TROPONIN I (HIGH SENSITIVITY): Troponin I (High Sensitivity): 4 ng/L (ref <18)

## 2022-03-20 LAB — APTT: aPTT: 30 seconds (ref 24–36)

## 2022-03-20 LAB — ETHANOL: Alcohol, Ethyl (B): <10 mg/dL (ref <10)

## 2022-03-20 MED ORDER — APIXABAN 5 MG PO TABS: 5.0000 mg | ORAL_TABLET | Freq: Two times a day (BID) | ORAL | Status: DC

## 2022-03-20 MED ORDER — APIXABAN 5 MG PO TABS
10.0000 mg | ORAL_TABLET | Freq: Two times a day (BID) | ORAL | Status: DC
  Administered 2022-03-20: 10 mg via ORAL
  Filled 2022-03-20: qty 2

## 2022-03-20 MED ORDER — APIXABAN (ELIQUIS) VTE STARTER PACK (10MG AND 5MG): ORAL_TABLET | ORAL | 0 refills | Status: AC

## 2022-03-20 MED ORDER — APIXABAN (ELIQUIS) EDUCATION KIT FOR DVT/PE PATIENTS
PACK | Freq: Once | Status: DC
  Filled 2022-03-20: qty 1

## 2022-03-20 MED ORDER — APIXABAN 5 MG PO TABS: 10.0000 mg | ORAL_TABLET | Freq: Two times a day (BID) | ORAL | Status: DC

## 2022-03-20 NOTE — Progress Notes (Signed)
Office Visit Note   Patient: Christina Patton           Date of Birth: 1945/09/26           MRN: [id number] Visit Date: 03/20/2022              Requested by: Cari Caraway, [place],  [place] [address] PCP: Cari Caraway, MD  Chief Complaint  Patient presents with   Right Knee - Follow-up      HPI: Patient is a pleasant 76 year old woman who is accompanied by her daughter.  She lives in an assisted living.  She is 2 weeks status post falling and sustaining a nondisplaced fracture of the right lateral tibial plateau.  She is in an adjustable knee brace.  She does not think they have been working with her on range of motion.  She has been nonweightbearing though apparently there was some confusion because the emergency room had said she could weight-bear.  She is complaining of some spasm and pain in her calf on the right she has been taking baby aspirin no history of blood clot  Assessment & Plan: Visit Diagnoses:  1. Tibial plateau fracture, right, closed, initial encounter     Plan: Nondisplaced right lateral tibial plateau fracture.  Have written order that she continue to work on range of motion but should be remaining nonweightbearing.  I will have her get an ultrasound today to rule out DVT.  If she does have a DVT I could put her on a starter pack but then she would have to follow-up with her primary care doctor follow-up in 2 weeks  Follow-Up Instructions: 2 weeks  Ortho Exam  Patient is alert, oriented, no adenopathy, well-dressed, normal affect, normal respiratory effort. Right leg she has some resolving ecchymosis on the anterior shin.  I am able to fully extend her leg and bend her to about 45 to 50 degrees.  No redness in the leg.  No swelling in the leg but she does have point tenderness over the posterior calf on the right.  This is reproduced with passive stretch distal pulses are intact  Imaging: XR Knee 1-2 Views Right  Result Date:  03/20/2022 2 views of the right knee were reviewed today.  Well-maintained alignment does have a lateral tibial plateau fracture extending from tibial spine to the lateral cortex.  Remains nondisplaced  No images are attached to the encounter.  Labs: No results found for: "HGBA1C", "ESRSEDRATE", "CRP", "LABURIC", "REPTSTATUS", "GRAMSTAIN", "CULT", "LABORGA"   Lab Results  Component Value Date   ALBUMIN 4.1 01/26/2022    No results found for: "MG" No results found for: "VD25OH"  No results found for: "PREALBUMIN"    Latest Ref Rng & Units 01/26/2022    2:58 AM 01/26/2022    1:02 AM  CBC EXTENDED  WBC 4.0 - 10.5 K/uL 14.4    RBC 3.87 - 5.11 MIL/uL 4.96    Hemoglobin 12.0 - 15.0 g/dL 16.0  15.6   HCT 36.0 - 46.0 % 45.5  46.0   Platelets 150 - 400 K/uL 196    NEUT# 1.7 - 7.7 K/uL 13.0    Lymph# 0.7 - 4.0 K/uL 0.8       There is no height or weight on file to calculate BMI.  Orders:  Orders Placed This Encounter  Procedures   XR Knee 1-2 Views Right   No orders of the defined types were placed in this encounter.  Procedures: No procedures performed  Clinical Data: No additional findings.  ROS:  All other systems negative, except as noted in the HPI. Review of Systems  Objective: Vital Signs: There were no vitals taken for this visit.  Specialty Comments:  No specialty comments available.  PMFS History: Patient Active Problem List   Diagnosis Date Noted   Tibial plateau fracture, right, closed, initial encounter 03/06/2022   Endotracheally intubated 01/27/2022   Food impaction of esophagus 01/25/2022   Esophageal diverticulum 01/25/2022   Hemoptysis 09/12/2016   Past Medical History:  Diagnosis Date   Anxiety    Cancer ([facility])    skin cancer on face   Osteoporosis    Zenker's diverticulum     Family History  Problem Relation Age of Onset   Ovarian cancer Mother    Breast cancer Neg Hx     Past Surgical History:  Procedure Laterality Date    ESOPHAGOGASTRODUODENOSCOPY N/A 01/25/2022   Procedure: ESOPHAGOGASTRODUODENOSCOPY (EGD);  Surgeon: Wilford Corner, MD;  Location: [facility];  Service: Gastroenterology;  Laterality: N/A;   ESOPHAGOSCOPY N/A 01/25/2022   Procedure: ESOPHAGOSCOPY;  Surgeon: Boyce Medici., MD;  Location: [facility] OR;  Service: ENT;  Laterality: N/A;   FOREIGN BODY REMOVAL  01/25/2022   Procedure: FOREIGN BODY REMOVAL;  Surgeon: Wilford Corner, MD;  Location: [facility];  Service: Gastroenterology;;   Stanford Breed DIVERTICULECTOMY     operated/repaired   Social History   Occupational History   Not on file  Tobacco Use   Smoking status: Never   Smokeless tobacco: Never  Substance and Sexual Activity   Alcohol use: Never   Drug use: Not on file   Sexual activity: Not on file

## 2022-03-20 NOTE — ED Provider Notes (Signed)
[facility] EMERGENCY DEPARTMENT Provider Note   CSN: [id number] Arrival date & time: 03/20/22  1208     History  Chief Complaint  Patient presents with   DVT    Christina Patton is a 76 y.o. female.  HPI   Patient with medical history of right tibial plateau fracture followed by orthopedics, recent Zenker's diverticulum status post diverticulectomy, osteoporosis, anxiety presents today due to right lower extremity pain and swelling.  Was seen by orthopedics today, DVT study ordered and was positive.  Patient denies any chest pain or shortness of breath.  She has not been ambulating very often secondary to the recent fracture.  Patient has a friend who is with her at bedside.  Friend is worried that her right side of her face looks "droopy or" than normal for unknown amount of time.   Patient denies any headache, vision changes, generalized weakness, lateralized weakness, numbness, tingling, chest pain, shortness of breath, dysarthria, history of PE or strokes.  She is not on blood thinners.  Home Medications Prior to Admission medications   Medication Sig Start Date End Date Taking? Authorizing Provider  clonazePAM (KLONOPIN) 0.5 MG tablet Take 0.5 mg by mouth daily as needed for anxiety. 03/16/22   [provider]  Dextrose in Lactated Ringers (DEXTROSE 5% LACTATED RINGERS) 5 % infusion Inject 30 mL/hr into the vein continuous. 01/27/22   Freddi Starr, MD  docusate (COLACE) 50 MG/5ML liquid Place 10 mLs (100 mg total) into feeding tube 2 (two) times daily as needed for mild constipation. 01/27/22   Freddi Starr, MD  fentaNYL 10 mcg/ml SOLN infusion Inject 0-200 mcg/hr into the vein continuous. 01/27/22   Freddi Starr, MD  hydrALAZINE (APRESOLINE) 20 MG/ML injection Inject 0.5 mLs (10 mg total) into the vein every 4 (four) hours as needed (sbp > 160). 01/27/22   Freddi Starr, MD  midazolam (VERSED) 2 MG/2ML SOLN injection Inject 1 mL (1 mg  total) into the vein every 2 (two) hours as needed (to maintain RASS goal.). 01/27/22   Freddi Starr, MD  ondansetron (ZOFRAN) 4 MG/2ML SOLN injection Inject 2 mLs (4 mg total) into the vein every 6 (six) hours as needed for nausea. 01/27/22   Freddi Starr, MD  pantoprazole (PROTONIX) 40 MG injection Inject 40 mg into the vein daily. 01/28/22   Freddi Starr, MD  pantoprazole (PROTONIX) 40 MG tablet Take by mouth. 03/11/22   [provider]  polyethylene glycol (MIRALAX / GLYCOLAX) 17 g packet Place 17 g into feeding tube daily as needed for moderate constipation. 01/27/22   Freddi Starr, MD  propofol (DIPRIVAN) 1000 MG/100ML EMUL injection Inject 276.5-4,424 mcg/min into the vein continuous. 01/27/22   Freddi Starr, MD  tacrolimus (PROTOPIC) 0.1 % ointment Apply 1 application  topically 2 (two) times daily. 02/21/22   [provider]      Allergies    Glucosamine, Oxycodone hcl, and Sulfa antibiotics    Review of Systems   Review of Systems  Physical Exam Updated Vital Signs BP (!) 148/79   Pulse 66   Temp 98.7 F (37.1 C) (Oral)   Resp 20   SpO2 100%  Physical Exam Vitals and nursing note reviewed. Exam conducted with a chaperone present.  Constitutional:      Appearance: Normal appearance.  HENT:     Head: Normocephalic and atraumatic.  Eyes:     General: No scleral icterus.  Right eye: No discharge.        Left eye: No discharge.     Extraocular Movements: Extraocular movements intact.     Pupils: Pupils are equal, round, and reactive to light.  Cardiovascular:     Rate and Rhythm: Normal rate and regular rhythm.     Pulses: Normal pulses.     Heart sounds: Normal heart sounds. No murmur heard.    No friction rub. No gallop.  Pulmonary:     Effort: Pulmonary effort is normal. No respiratory distress.     Breath sounds: Normal breath sounds.  Abdominal:     General: Abdomen is flat. Bowel sounds are normal. There is no distension.      Palpations: Abdomen is soft.     Tenderness: There is no abdominal tenderness.  Musculoskeletal:        General: Swelling present.     Right lower leg: Edema present.     Comments: Contusion to anterior right lower limb.  Moving upper without difficulty but right lower extremity is notably weaker secondary to pain due to fracture.  Right lower extremity with edema, nonpitting.  Skin:    General: Skin is warm and dry.     Capillary Refill: Capillary refill takes less than 2 seconds.     Coloration: Skin is not jaundiced.     Findings: Bruising present.  Neurological:     Mental Status: She is alert. Mental status is at baseline.     Coordination: Coordination normal.     Comments: Patient has slight right-sided facial droop at rest.  When she smiles and finishes.  Cranial nerves II through XII are grossly intact.  Oriented x3.  Normal finger-nose, no pronator drift and grip strength is equal bilaterally.  Decreased range to right lower extremity, dorsiflexion and plantarflexion symmetric.  Sensation light touch is grossly intact.     ED Results / Procedures / Treatments   Labs (all labs ordered are listed, but only abnormal results are displayed) Labs Reviewed  BASIC METABOLIC PANEL - Abnormal; Notable for the following components:      Result Value   Potassium 3.4 (*)    Glucose, Bld 105 (*)    All other components within normal limits  URINALYSIS, ROUTINE W REFLEX MICROSCOPIC - Abnormal; Notable for the following components:   Ketones, ur 20 (*)    All other components within normal limits  I-STAT CHEM 8, ED - Abnormal; Notable for the following components:   Potassium 3.4 (*)    Glucose, Bld 101 (*)    Calcium, Ion 1.09 (*)    All other components within normal limits  RESP PANEL BY RT-PCR (FLU A&B, COVID) ARPGX2  CBC WITH DIFFERENTIAL/PLATELET  BRAIN NATRIURETIC PEPTIDE  RAPID URINE DRUG SCREEN, HOSP PERFORMED  ETHANOL  PROTIME-INR  APTT  TROPONIN I (HIGH  SENSITIVITY)    EKG EKG Interpretation  Date/Time:  Tuesday March 20 2022 12:20:47 EDT Ventricular Rate:  69 PR Interval:  130 QRS Duration: 72 QT Interval:  422 QTC Calculation: 452 R Axis:   84 Text Interpretation: Normal sinus rhythm Cannot rule out Anterior infarct , age undetermined Abnormal ECG No previous ECGs available Confirmed by Gareth Morgan [phone]) on 03/20/2022 4:19:42 PM  Radiology CT HEAD WO CONTRAST  Result Date: 03/20/2022 CLINICAL DATA:  Transient ischemic attack (TIA) EXAM: CT HEAD WITHOUT CONTRAST TECHNIQUE: Contiguous axial images were obtained from the base of the skull through the vertex without intravenous contrast. RADIATION DOSE REDUCTION: This exam was  performed according to the departmental dose-optimization program which includes automated exposure control, adjustment of the mA and/or kV according to patient size and/or use of iterative reconstruction technique. COMPARISON:  None Available. FINDINGS: Brain: There is enlargement of the ventricles which is out of proportion to the degree of sulcal atrophy. The callosal angle is also acute and there is crowding of sulci at the vertex. There is also prominence of the sylvian fissures bilaterally. No evidence of acute large vascular territory infarct, acute hemorrhage, mass lesion, or midline shift. Patchy white matter hypodensities, nonspecific compatible with chronic microvascular ischemic disease. Vascular: Hyperdense vessel identified. Calcific intracranial atherosclerosis. Skull: No acute fracture. Sinuses/Orbits: Largely clear sinuses.  No acute orbital findings. Other: No mastoid effusions. IMPRESSION: 1. Ventriculomegaly with findings suggestive of normal pressure hydrocephalus, as detailed above. 2. Chronic microvascular ischemic disease. Electronically Signed   By: Margaretha Sheffield M.D.   On: 03/20/2022 16:09   VAS [place] LOWER EXTREMITY VENOUS (DVT)  Result Date: 03/20/2022  Lower Venous DVT Study Patient  Name:  IRLENE CRUDUP [facility]  Date of Exam:   03/20/2022 Medical Rec #: [id number]        Accession #:    [id number] Date of Birth: Jun 24, 1946         Patient Gender: F Patient Age:   78 years Exam Location:  [facility] Procedure:      VAS [place] LOWER EXTREMITY VENOUS (DVT) Referring Phys: Stanton Kidney PERSONS --------------------------------------------------------------------------------  Indications: Pain, and Swelling.  Comparison Study: no prior Performing Technologist: [name] RVS  Examination Guidelines: A complete evaluation includes B-mode imaging, spectral Doppler, color Doppler, and power Doppler as needed of all accessible portions of each vessel. Bilateral testing is considered an integral part of a complete examination. Limited examinations for reoccurring indications may be performed as noted. The reflux portion of the exam is performed with the patient in reverse Trendelenburg.  +---------+---------------+---------+-----------+----------+------------------+ RIGHT    CompressibilityPhasicitySpontaneityPropertiesThrombus Aging     +---------+---------------+---------+-----------+----------+------------------+ CFV      Full           Yes      Yes                                     +---------+---------------+---------+-----------+----------+------------------+ SFJ      Full                                                            +---------+---------------+---------+-----------+----------+------------------+ FV Prox  None                                         Age Indeterminate  +---------+---------------+---------+-----------+----------+------------------+ FV Mid   None                                         Age Indeterminate  +---------+---------------+---------+-----------+----------+------------------+ FV DistalNone  Age Indeterminate  +---------+---------------+---------+-----------+----------+------------------+ PFV       None                                         Age Indeterminate  +---------+---------------+---------+-----------+----------+------------------+ POP      Partial        Yes      Yes                  Age Indeterminate  +---------+---------------+---------+-----------+----------+------------------+ PTV      None                                         in a single paired                                                       vein               +---------+---------------+---------+-----------+----------+------------------+ PERO     None                                         in a single paired                                                       vein               +---------+---------------+---------+-----------+----------+------------------+ Gastroc  None                                         Age Indeterminate  +---------+---------------+---------+-----------+----------+------------------+ SSV      None                                         Age Indeterminate  +---------+---------------+---------+-----------+----------+------------------+   +----+---------------+---------+-----------+----------+--------------+ LEFTCompressibilityPhasicitySpontaneityPropertiesThrombus Aging +----+---------------+---------+-----------+----------+--------------+ CFV Full           Yes      Yes                                 +----+---------------+---------+-----------+----------+--------------+     Summary: RIGHT: - Findings consistent with age indeterminate deep vein thrombosis involving the right femoral vein, right proximal profunda vein, right popliteal vein, right posterior tibial veins, right peroneal veins, and right gastrocnemius veins. - Findings consistent with age indeterminate superficial vein thrombosis involving the right small saphenous vein.   *See table(s) above for measurements and observations. Electronically signed by Servando Snare MD on  03/20/2022 at 2:10:33 PM.    Final    XR Knee 1-2 Views Right  Result Date: 03/20/2022 2 views of the right knee were reviewed today.  Well-maintained alignment does have a  lateral tibial plateau fracture extending from tibial spine to the lateral cortex.  Remains nondisplaced   Procedures Procedures    Medications Ordered in ED Medications  apixaban [facility]) Education Kit for DVT/PE patients (has no administration in time range)    ED Course/ Medical Decision Making/ A&P                            Medical Decision Making Amount and/or Complexity of Data Reviewed Labs: ordered. Decision-making details documented in ED Course. Radiology: ordered.   Patient presents with right lower extremity pain and swelling.  Differential includes but not limited to DVT, ischemic limb, compartment syndrome, cellulitis, cerulean phlegmasia.  Considered PE but given absence of chest pain, shortness of breath, hypoxia or tachycardia I think that is less likely.  No focal deficits neurologically on exam.  There is some slight right-sided facial asymmetry at baseline which entirely resolved when she smiles.  She has pulses to lower extremities bilaterally, compartments are soft.  There is some swelling to the right lower limb as well as tenderness to the posterior calf.  I reviewed external records.  Patient was seen by orthopedics earlier today, I reviewed those notes as document HPI.  Sister is an independent historian as well as friend who was there at initial HPI.  -DVT RLE [place] Summary:  - Findings consistent with age indeterminate deep vein thrombosis  involving the right femoral vein, right proximal profunda vein, right  popliteal vein, right posterior tibial veins, right peroneal veins, and  right gastrocnemius veins.  - Findings consistent with age indeterminate superficial vein thrombosis  involving the right small saphenous vein.   I ordered and reviewed laboratory work-up:  CBC without  leukocytosis or anemia.   BMP without gross electrolyte derangement, AKI, hypokalemia.   BNP unremarkable, negative troponin. UA and UDS unremarkable  I consulted with vascular surgery regarding the degree of clot burden.  Dr. Donzetta Matters with vascular has evaluated the patient, he advises 3 months anticoagulation with PCP follow-up.  Does not the patient is to be seen in the office with vascular for follow-up.  Appreciate his consult and help caring this patient.  Additional independent history collected by patient's Caryl Bis who is now at bedside.  She states that the possible treatment of patient's very subtle, she did not notice a difference.  I did consult with Dr. Malen Gauze with neurology given patient's friend concerned about asymmetric face.  After talking with the patient's sister I do feel this is more her baseline and not an acute facial droop especially given the absence of changes when she smiles.  He advises MR brain if worried about a stroke but if this is her baseline sometimes there are physiologic changes with age and does not sound like facial droop.  CT head is with ventriculomegaly.  Unknown onset, could indicate hydrocephalus.  I spoke again with Dr. Malen Gauze with neurology, advises neurosurgery follow-up nonemergent not indication for hospitalization.  Reevaluated the patient with sister in room.  She reiterates that she is at her baseline, no changes in mentation.  She is agreeable to follow-up with neurosurgery outpatient.  Reviewed EKG which shows sinus rhythm without any ischemic changes per my interpretation.  Patient is on cardiac monitoring is in sinus rhythm in the room.  Impression  DVT -3 months anticoagulation with Eliquis.  Education Provided by pharmacy. Ventriculomegaly on CT-follow-up with neurosurgery.  Referral provided.  Discussed HPI, physical exam and plan of  care for this patient with attending Gareth Morgan. The attending physician evaluated this patient as  part of a shared visit and agrees with plan of care.         Final Clinical Impression(s) / ED Diagnoses Final diagnoses:  Deep vein thrombosis (DVT) of other vein of right lower extremity, unspecified chronicity ([place])  Acquired cerebral ventriculomegaly [facility])    Rx / DC Orders ED Discharge Orders     None         Sherrill Raring, PA-C 03/20/22 1820    Gareth Morgan, MD 03/21/22 0730

## 2022-03-20 NOTE — ED Notes (Addendum)
Pt provided discharge instructions and prescription information. Pt was given the opportunity to ask questions and questions were answered.   

## 2022-03-20 NOTE — Consult Note (Signed)
Hospital Consult    Reason for Consult:  RLE DVT Referring Physician:  Dr. Billy Fischer MRN #:  [id number]  History of Present Illness: 76 y.o. female with recent complications of recurrent Zenker's diverticulum where she ended up admitted to a nursing facility.  She then subsequently had a right tibial plateau fracture and has been wearing a brace and has been nonweightbearing for the past 2 weeks.  2 days ago she developed worsening swelling of her right lower extremity with mild discomfort only.  She denies any history of personal or family DVT and has never been on anticoagulation in the past.  Past Medical History:  Diagnosis Date   Anxiety    Cancer ([place])    skin cancer on face   Osteoporosis    Zenker's diverticulum     Past Surgical History:  Procedure Laterality Date   ESOPHAGOGASTRODUODENOSCOPY N/A 01/25/2022   Procedure: ESOPHAGOGASTRODUODENOSCOPY (EGD);  Surgeon: Wilford Corner, MD;  Location: [facility];  Service: Gastroenterology;  Laterality: N/A;   ESOPHAGOSCOPY N/A 01/25/2022   Procedure: ESOPHAGOSCOPY;  Surgeon: Boyce Medici., MD;  Location: [facility];  Service: ENT;  Laterality: N/A;   FOREIGN BODY REMOVAL  01/25/2022   Procedure: FOREIGN BODY REMOVAL;  Surgeon: Wilford Corner, MD;  Location: [facility];  Service: Gastroenterology;;   [facility] Breed DIVERTICULECTOMY     operated/repaired    Allergies  Allergen Reactions   Glucosamine     Other reaction(s): stomach upset   Oxycodone Hcl     Other reaction(s): nausea   Sulfa Antibiotics     Other reaction(s): childhood allergy    Prior to Admission medications   Medication Sig Start Date End Date Taking? Authorizing Provider  clonazePAM (KLONOPIN) 0.5 MG tablet Take 0.5 mg by mouth daily as needed for anxiety. 03/16/22   [provider]  Dextrose in Lactated Ringers (DEXTROSE 5% LACTATED RINGERS) 5 % infusion Inject 30 mL/hr into the vein continuous. 01/27/22   Freddi Starr, MD  docusate  (COLACE) 50 MG/5ML liquid Place 10 mLs (100 mg total) into feeding tube 2 (two) times daily as needed for mild constipation. 01/27/22   Freddi Starr, MD  fentaNYL 10 mcg/ml SOLN infusion Inject 0-200 mcg/hr into the vein continuous. 01/27/22   Freddi Starr, MD  hydrALAZINE (APRESOLINE) 20 MG/ML injection Inject 0.5 mLs (10 mg total) into the vein every 4 (four) hours as needed (sbp > 160). 01/27/22   Freddi Starr, MD  midazolam (VERSED) 2 MG/2ML SOLN injection Inject 1 mL (1 mg total) into the vein every 2 (two) hours as needed (to maintain RASS goal.). 01/27/22   Freddi Starr, MD  ondansetron (ZOFRAN) 4 MG/2ML SOLN injection Inject 2 mLs (4 mg total) into the vein every 6 (six) hours as needed for nausea. 01/27/22   Freddi Starr, MD  pantoprazole (PROTONIX) 40 MG injection Inject 40 mg into the vein daily. 01/28/22   Freddi Starr, MD  pantoprazole (PROTONIX) 40 MG tablet Take by mouth. 03/11/22   [provider]  polyethylene glycol (MIRALAX / GLYCOLAX) 17 g packet Place 17 g into feeding tube daily as needed for moderate constipation. 01/27/22   Freddi Starr, MD  propofol (DIPRIVAN) 1000 MG/100ML EMUL injection Inject 276.5-4,424 mcg/min into the vein continuous. 01/27/22   Freddi Starr, MD  tacrolimus (PROTOPIC) 0.1 % ointment Apply 1 application  topically 2 (two) times daily. 02/21/22   [provider]    Social History   Socioeconomic History  Marital status: Married    Spouse name: Not on file   Number of children: Not on file   Years of education: Not on file   Highest education level: Not on file  Occupational History   Not on file  Tobacco Use   Smoking status: Never   Smokeless tobacco: Never  Substance and Sexual Activity   Alcohol use: Never   Drug use: Not on file   Sexual activity: Not on file  Other Topics Concern   Not on file  Social History Narrative   Not on file   Social Determinants of Health   Financial  Resource Strain: Not on file  Food Insecurity: Not on file  Transportation Needs: Not on file  Physical Activity: Not on file  Stress: Not on file  Social Connections: Not on file  Intimate Partner Violence: Not on file     Family History  Problem Relation Age of Onset   Ovarian cancer Mother    Breast cancer Neg Hx     Review of Systems  Constitutional: Negative.   HENT: Negative.    Eyes: Negative.   Respiratory: Negative.    Cardiovascular:  Positive for leg swelling.  Gastrointestinal: Negative.   Musculoskeletal:        Right leg discomfort  Skin: Negative.   Neurological: Negative.   Endo/Heme/Allergies: Negative.   Psychiatric/Behavioral: Negative.      Physical Examination  Vitals:   03/20/22 1216 03/20/22 1426  BP: 124/77 136/68  Pulse: 68 64  Resp: 18 20  Temp: 98 F (36.7 C)   SpO2: 98% 99%   There is no height or weight on file to calculate BMI.  Physical Exam HENT:     Head: Normocephalic.     Nose: Nose normal.     Mouth/Throat:     Mouth: Mucous membranes are moist.  Eyes:     Pupils: Pupils are equal, round, and reactive to light.  Cardiovascular:     Rate and Rhythm: Normal rate.     Pulses: Normal pulses.  Pulmonary:     Effort: Pulmonary effort is normal.  Abdominal:     General: Abdomen is flat.     Palpations: Abdomen is soft.  Musculoskeletal:     Cervical back: Normal range of motion and neck supple.     Right lower leg: Edema present.     Left lower leg: No edema.  Skin:    Capillary Refill: Capillary refill takes less than 2 seconds.  Neurological:     General: No focal deficit present.     Mental Status: She is alert.  Psychiatric:        Mood and Affect: Mood normal.      CBC    Component Value Date/Time   WBC 9.4 03/20/2022 1300   RBC 4.12 03/20/2022 1300   HGB 12.9 03/20/2022 1412   HCT 38.0 03/20/2022 1412   PLT 313 03/20/2022 1300   MCV 94.2 03/20/2022 1300   MCH 30.8 03/20/2022 1300   MCHC 32.7  03/20/2022 1300   RDW 12.1 03/20/2022 1300   LYMPHSABS 1.3 03/20/2022 1300   MONOABS 1.0 03/20/2022 1300   EOSABS 0.1 03/20/2022 1300   BASOSABS 0.0 03/20/2022 1300    BMET    Component Value Date/Time   NA 136 03/20/2022 1412   K 3.4 (L) 03/20/2022 1412   CL 99 03/20/2022 1412   CO2 25 03/20/2022 1300   GLUCOSE 101 (H) 03/20/2022 1412   BUN 11 03/20/2022 1412  CREATININE 0.50 03/20/2022 1412   CALCIUM 9.0 03/20/2022 1300   GFRNONAA >60 03/20/2022 1300    COAGS: Lab Results  Component Value Date   INR 1.0 01/26/2022     Non-Invasive Vascular Imaging:     RIGHT    CompressibilityPhasicitySpontaneityPropertiesThrombus Aging       +---------+---------------+---------+-----------+----------+---------------  ---+  CFV      Full           Yes      Yes                                       +---------+---------------+---------+-----------+----------+---------------  ---+  SFJ      Full                                                              +---------+---------------+---------+-----------+----------+---------------  ---+  FV Prox  None                                         Age  Indeterminate   +---------+---------------+---------+-----------+----------+---------------  ---+  FV Mid   None                                         Age  Indeterminate   +---------+---------------+---------+-----------+----------+---------------  ---+  FV DistalNone                                         Age  Indeterminate   +---------+---------------+---------+-----------+----------+---------------  ---+  PFV      None                                         Age  Indeterminate   +---------+---------------+---------+-----------+----------+---------------  ---+  POP      Partial        Yes      Yes                  Age  Indeterminate   +---------+---------------+---------+-----------+----------+---------------  ---+  PTV       None                                         in a single  paired                                                        vein                 +---------+---------------+---------+-----------+----------+---------------  ---+  PERO     None  in a single  paired                                                        vein                 +---------+---------------+---------+-----------+----------+---------------  ---+  Gastroc  None                                         Age  Indeterminate   +---------+---------------+---------+-----------+----------+---------------  ---+  SSV      None                                         Age  Indeterminate     LEFTCompressibilityPhasicitySpontaneityPropertiesThrombus Aging  +----+---------------+---------+-----------+----------+--------------+  CFV Full           Yes      Yes                                  +----+---------------+---------+-----------+----------+--------------+        Summary:  RIGHT:  - Findings consistent with age indeterminate deep vein thrombosis  involving the right femoral vein, right proximal profunda vein, right  popliteal vein, right posterior tibial veins, right peroneal veins, and  right gastrocnemius veins.  - Findings consistent with age indeterminate superficial vein thrombosis  involving the right small saphenous vein.    ASSESSMENT/PLAN: This is a 76 y.o. female here with extensive RLE dvt but with minimal symptoms.  I have recommended 3 months of anticoagulation for to continue anticoagulation while she remains  nonweightbearing in the right lower extremity.  She can follow-up with me on an as-needed basis.  Jesika Men C. Donzetta Matters, MD [facility] Office: [phone] Pager: [phone]

## 2022-03-20 NOTE — ED Triage Notes (Signed)
Pt arrived from Vascular with her daughter c/o a right DVT that expands almost the entire leg. Pt broke her leg 2 weeks ago and has been on bed rest.

## 2022-03-20 NOTE — Progress Notes (Signed)
Lower extremity venous duplex has been completed.   Preliminary results in CV Proc.  Results given to MD.   [name] 03/20/2022 11:52 AM

## 2022-03-20 NOTE — Progress Notes (Signed)
ANTICOAGULATION CONSULT NOTE - Initial Consult  Pharmacy Consult for apixaban Indication: DVT  Allergies  Allergen Reactions   Glucosamine     Other reaction(s): stomach upset   Oxycodone Hcl     Other reaction(s): nausea   Sulfa Antibiotics     Other reaction(s): childhood allergy    Patient Measurements:   Heparin Dosing Weight: n/a  Vital Signs: Temp: 98.7 F (37.1 C) (08/22 1600) Temp Source: Oral (08/22 1600) BP: 148/79 (08/22 1600) Pulse Rate: 66 (08/22 1600)  Labs: Recent Labs    03/20/22 1300 03/20/22 1412  HGB 12.7 12.9  HCT 38.8 38.0  PLT 313  --   CREATININE 0.69 0.50  TROPONINIHS 4  --     CrCl cannot be calculated (Unknown ideal weight.).   Medical History: Past Medical History:  Diagnosis Date   Anxiety    Cancer ([place])    skin cancer on face   Osteoporosis    Zenker's diverticulum     Medications:  Scheduled:   apixaban   Does not apply Once    Assessment: 76 yo F with extensive RLE deep vein thrombosis after 2wk period of immobility following breaking her leg. Pharmacy is consulted for apixaban dosing.  On admit: Hgb 12.7/Plt 313  Goal of Therapy:  Monitor platelets by anticoagulation protocol: Yes   Plan:  Apixaban '10mg'$  by mouth twice daily x7 days followed by apixaban '5mg'$  by mouth twice daily thereafter  Monitor s/sx bleeding    Carolin Guernsey Clinical pharmacist 03/20/2022,4:41 PM

## 2022-03-20 NOTE — Discharge Instructions (Addendum)
You are seen today in the emergency department for right lower extremity pain.  You have a blood clot to the right lower extremity, you will need to take the Eliquis as prescribed.  This is a blood thinner, if you were to fall and hit your head you need to be seen in the emergency department.  Do not take any anti-inflammatory medicines with the blood thinner.  You will need to be on this for about 3 months per vascular recommendations.  Please follow-up with your PCP regarding this, see them next week for reevaluation  and refills of the eliquis.   Additionally, you do have enlargement of brain ventricles on the CT scan of your brain.  We call this ventriculomegaly, please follow-up with neurosurgeon Dr. Kathyrn Sheriff for reevaluation in about a week or so.  Return to emergency department for chest pain, shortness of breath, falls, new or concerning symptoms.  Information on my medicine - ELIQUIS (apixaban)  Why was Eliquis prescribed for you? Eliquis was prescribed to treat blood clots that may have been found in the veins of your legs (deep vein thrombosis) or in your lungs (pulmonary embolism) and to reduce the risk of them occurring again.  What do You need to know about Eliquis ? The starting dose is 10 mg (two 5 mg tablets) taken TWICE daily for the FIRST SEVEN (7) DAYS, then on Tuesday 03/27/2022  the dose is reduced to ONE 5 mg tablet taken TWICE daily.  Eliquis may be taken with or without food.   Try to take the dose about the same time in the morning and in the evening. If you have difficulty swallowing the tablet whole please discuss with your pharmacist how to take the medication safely.  Take Eliquis exactly as prescribed and DO NOT stop taking Eliquis without talking to the doctor who prescribed the medication.  Stopping may increase your risk of developing a new blood clot.  Refill your prescription before you run out.  After discharge, you should have regular check-up  appointments with your healthcare provider that is prescribing your Eliquis.    What do you do if you miss a dose? If a dose of ELIQUIS is not taken at the scheduled time, take it as soon as possible on the same day and twice-daily administration should be resumed. The dose should not be doubled to make up for a missed dose.  Important Safety Information A possible side effect of Eliquis is bleeding. You should call your healthcare provider right away if you experience any of the following: Bleeding from an injury or your nose that does not stop. Unusual colored urine (red or dark brown) or unusual colored stools (red or black). Unusual bruising for unknown reasons. A serious fall or if you hit your head (even if there is no bleeding).  Some medicines may interact with Eliquis and might increase your risk of bleeding or clotting while on Eliquis. To help avoid this, consult your healthcare provider or pharmacist prior to using any new prescription or non-prescription medications, including herbals, vitamins, non-steroidal anti-inflammatory drugs (NSAIDs) and supplements.  This website has more information on Eliquis (apixaban): http://www.eliquis.com/eliquis/home

## 2022-03-20 NOTE — ED Notes (Signed)
Apixaban DVT education kit not available at this time per pharmacy. Pt instructed to ask for education kit when she picks up the Rx from the pharmacy.

## 2022-03-20 NOTE — ED Provider Triage Note (Signed)
Emergency Medicine Provider Triage Evaluation Note  Christina Patton , a 76 y.o. female  was evaluated in triage.  Pt complains of DVT to RLE. Patient seen by vascular prior to arrival and sent to ED due to DVT. No chest pain or SOB. Broke leg on 8/7. Recent admission on 7/1 due to Zenker's diverticulum.   US shows: RIGHT:  - Findings consistent with age indeterminate deep vein thrombosis involving the right femoral vein, right proximal profunda vein, right popliteal vein, right posterior tibial veins, right peroneal veins, and right gastrocnemius veins.  - Findings consistent with age indeterminate superficial vein thrombosis involving the right small saphenous vein.   Review of Systems  Positive: RLE edema Negative: CP  Physical Exam  BP 124/77   Pulse 68   Temp 98 F (36.7 C) (Oral)   Resp 18   SpO2 98%  Gen:   Awake, no distress   Resp:  Normal effort  MSK:   Moves extremities without difficulty  Other:  Edema to RLE  Medical Decision Making  Medically screening exam initiated at 12:45 PM.  Appropriate orders placed.  RIMSHA TREMBLEY was informed that the remainder of the evaluation will be completed by another provider, this initial triage assessment does not replace that evaluation, and the importance of remaining in the ED until their evaluation is complete.  RLE DVT Labs ordered Normal vitals in triage   Suzy Bouchard, PA-C 03/20/22 1256

## 2022-03-22 DIAGNOSIS — R2689 Other abnormalities of gait and mobility: Secondary | ICD-10-CM | POA: Diagnosis not present

## 2022-03-22 DIAGNOSIS — M6281 Muscle weakness (generalized): Secondary | ICD-10-CM | POA: Diagnosis not present

## 2022-03-22 DIAGNOSIS — R279 Unspecified lack of coordination: Secondary | ICD-10-CM | POA: Diagnosis not present

## 2022-03-22 DIAGNOSIS — Z9181 History of falling: Secondary | ICD-10-CM | POA: Diagnosis not present

## 2022-03-23 ENCOUNTER — Telehealth: Payer: Self-pay | Admitting: Physician Assistant

## 2022-03-23 ENCOUNTER — Telehealth: Payer: Self-pay

## 2022-03-23 DIAGNOSIS — R279 Unspecified lack of coordination: Secondary | ICD-10-CM | POA: Diagnosis not present

## 2022-03-23 DIAGNOSIS — I82401 Acute embolism and thrombosis of unspecified deep veins of right lower extremity: Secondary | ICD-10-CM | POA: Diagnosis not present

## 2022-03-23 DIAGNOSIS — R2689 Other abnormalities of gait and mobility: Secondary | ICD-10-CM | POA: Diagnosis not present

## 2022-03-23 DIAGNOSIS — M6281 Muscle weakness (generalized): Secondary | ICD-10-CM | POA: Diagnosis not present

## 2022-03-23 NOTE — Telephone Encounter (Signed)
     Patient  visit on 03/20/2022  at [facility] was for Deep Vein thrombosis  Have you been able to follow up with your primary care physician? YES  The patient was or was not able to obtain any needed medicine or equipment.YES  Are there diet recommendations that you are having difficulty following? NA  Patient expresses understanding of discharge instructions and education provided has no other needs at this time. [place], Care Management  [phone] [address], [place], [place] [address] Phone: [phone] Email: [email or web address]

## 2022-03-23 NOTE — Telephone Encounter (Signed)
Please disregard- opened in error

## 2022-03-23 NOTE — Telephone Encounter (Signed)
Received call from [facility] w/ [employer], therapy dept. Requesting last ov note. I faxed [phone], ph [phone]

## 2022-03-26 DIAGNOSIS — R279 Unspecified lack of coordination: Secondary | ICD-10-CM | POA: Diagnosis not present

## 2022-03-26 DIAGNOSIS — R2689 Other abnormalities of gait and mobility: Secondary | ICD-10-CM | POA: Diagnosis not present

## 2022-03-26 DIAGNOSIS — M6281 Muscle weakness (generalized): Secondary | ICD-10-CM | POA: Diagnosis not present

## 2022-03-27 DIAGNOSIS — M6281 Muscle weakness (generalized): Secondary | ICD-10-CM | POA: Diagnosis not present

## 2022-03-27 DIAGNOSIS — Z9181 History of falling: Secondary | ICD-10-CM | POA: Diagnosis not present

## 2022-03-27 DIAGNOSIS — R2689 Other abnormalities of gait and mobility: Secondary | ICD-10-CM | POA: Diagnosis not present

## 2022-03-27 DIAGNOSIS — R279 Unspecified lack of coordination: Secondary | ICD-10-CM | POA: Diagnosis not present

## 2022-03-29 DIAGNOSIS — Z9181 History of falling: Secondary | ICD-10-CM | POA: Diagnosis not present

## 2022-03-29 DIAGNOSIS — M6281 Muscle weakness (generalized): Secondary | ICD-10-CM | POA: Diagnosis not present

## 2022-03-30 DIAGNOSIS — G479 Sleep disorder, unspecified: Secondary | ICD-10-CM | POA: Diagnosis not present

## 2022-03-30 DIAGNOSIS — I82401 Acute embolism and thrombosis of unspecified deep veins of right lower extremity: Secondary | ICD-10-CM | POA: Diagnosis not present

## 2022-03-30 DIAGNOSIS — S82141S Displaced bicondylar fracture of right tibia, sequela: Secondary | ICD-10-CM | POA: Diagnosis not present

## 2022-03-30 DIAGNOSIS — R2689 Other abnormalities of gait and mobility: Secondary | ICD-10-CM | POA: Diagnosis not present

## 2022-03-30 DIAGNOSIS — R279 Unspecified lack of coordination: Secondary | ICD-10-CM | POA: Diagnosis not present

## 2022-03-30 DIAGNOSIS — Z6820 Body mass index (BMI) 20.0-20.9, adult: Secondary | ICD-10-CM | POA: Diagnosis not present

## 2022-03-30 DIAGNOSIS — G9389 Other specified disorders of brain: Secondary | ICD-10-CM | POA: Diagnosis not present

## 2022-03-30 DIAGNOSIS — M6281 Muscle weakness (generalized): Secondary | ICD-10-CM | POA: Diagnosis not present

## 2022-04-02 DIAGNOSIS — R279 Unspecified lack of coordination: Secondary | ICD-10-CM | POA: Diagnosis not present

## 2022-04-02 DIAGNOSIS — R2689 Other abnormalities of gait and mobility: Secondary | ICD-10-CM | POA: Diagnosis not present

## 2022-04-02 DIAGNOSIS — M6281 Muscle weakness (generalized): Secondary | ICD-10-CM | POA: Diagnosis not present

## 2022-04-03 ENCOUNTER — Ambulatory Visit (INDEPENDENT_AMBULATORY_CARE_PROVIDER_SITE_OTHER): Payer: Medicare HMO

## 2022-04-03 ENCOUNTER — Ambulatory Visit (INDEPENDENT_AMBULATORY_CARE_PROVIDER_SITE_OTHER): Payer: Medicare HMO | Admitting: Physician Assistant

## 2022-04-03 ENCOUNTER — Encounter: Payer: Self-pay | Admitting: Physician Assistant

## 2022-04-03 ENCOUNTER — Telehealth: Payer: Self-pay | Admitting: Physician Assistant

## 2022-04-03 DIAGNOSIS — S82141A Displaced bicondylar fracture of right tibia, initial encounter for closed fracture: Secondary | ICD-10-CM

## 2022-04-03 DIAGNOSIS — Z9181 History of falling: Secondary | ICD-10-CM | POA: Diagnosis not present

## 2022-04-03 DIAGNOSIS — M6281 Muscle weakness (generalized): Secondary | ICD-10-CM | POA: Diagnosis not present

## 2022-04-03 NOTE — Telephone Encounter (Signed)
Received call from [facility] (OT) needing to know about office visit for the patient and weight bearing and status on patient's blood clot. The number to contact Cyril Mourning is [phone]

## 2022-04-03 NOTE — Progress Notes (Signed)
Office Visit Note   Patient: Christina Patton           Date of Birth: Oct 22, 1945           MRN: [id number] Visit Date: 04/03/2022              Requested by: Cari Caraway, [place],  [place] [address] PCP: Cari Caraway, MD  Chief Complaint  Patient presents with   Right Knee - Follow-up      HPI: Christina Patton is a pleasant 76 year old woman who is 4 weeks status post nondisplaced lateral tibial plateau fracture.  I last saw her she was complaining of calf pain.  She was referred for a ultrasound which demonstrated an extensive DVT up above her knee.  She has been on Eliquis.  She reports no pain in her calf or leg today.  She has been compliant with nonweightbearing and is in a wheelchair.  She has been working at the assisted living on range of motion.  She is also in the process of evaluation for a Zenker's diverticulum.  Assessment & Plan: Visit Diagnoses:  1. Tibial plateau fracture, right, closed, initial encounter     Plan: She had slight separation of the tibial spine but the fracture remains in good position.  She continue nonweightbearing.  Continue range of motion of her knee.  Continue with her brace.  Follow-up in 2 weeks for repeat x-rays  Follow-Up Instructions: No follow-ups on file.   Ortho Exam  Patient is alert, oriented, no adenopathy, well-dressed, normal affect, normal respiratory effort. Examination of her right leg soft nontender compartments are soft negative Christina Patton' sign swelling is decreased.  No particular tenderness over the knee she has full extension and flexion to 90 to 100 degrees.  Sensation is intact.  Imaging: XR Knee 1-2 Views Right  Result Date: 04/03/2022 2 radiographs of her right knee were reviewed today.  She does have a nondisplaced lateral tibial plateau fracture.  She has had a little bit more slight displacement separation at the tibial spine than previous x-rays other does look like there is some bony bridging.  No  images are attached to the encounter.  Labs: No results found for: "HGBA1C", "ESRSEDRATE", "CRP", "LABURIC", "REPTSTATUS", "GRAMSTAIN", "CULT", "LABORGA"   Lab Results  Component Value Date   ALBUMIN 4.1 01/26/2022    No results found for: "MG" No results found for: "VD25OH"  No results found for: "PREALBUMIN"    Latest Ref Rng & Units 03/20/2022    2:12 PM 03/20/2022    1:00 PM 01/26/2022    2:58 AM  CBC EXTENDED  WBC 4.0 - 10.5 K/uL  9.4  14.4   RBC 3.87 - 5.11 MIL/uL  4.12  4.96   Hemoglobin 12.0 - 15.0 g/dL 12.9  12.7  16.0   HCT 36.0 - 46.0 % 38.0  38.8  45.5   Platelets 150 - 400 K/uL  313  196   NEUT# 1.7 - 7.7 K/uL  7.0  13.0   Lymph# 0.7 - 4.0 K/uL  1.3  0.8      There is no height or weight on file to calculate BMI.  Orders:  Orders Placed This Encounter  Procedures   XR Knee 1-2 Views Right   No orders of the defined types were placed in this encounter.    Procedures: No procedures performed  Clinical Data: No additional findings.  ROS:  All other systems negative, except as noted in the HPI. Review  of Systems  Objective: Vital Signs: There were no vitals taken for this visit.  Specialty Comments:  No specialty comments available.  PMFS History: Patient Active Problem List   Diagnosis Date Noted   Tibial plateau fracture, right, closed, initial encounter 03/06/2022   Endotracheally intubated 01/27/2022   Food impaction of esophagus 01/25/2022   Esophageal diverticulum 01/25/2022   Hemoptysis 09/12/2016   Past Medical History:  Diagnosis Date   Anxiety    Cancer ([place])    skin cancer on face   Osteoporosis    Zenker's diverticulum     Family History  Problem Relation Age of Onset   Ovarian cancer Mother    Breast cancer Neg Hx     Past Surgical History:  Procedure Laterality Date   ESOPHAGOGASTRODUODENOSCOPY N/A 01/25/2022   Procedure: ESOPHAGOGASTRODUODENOSCOPY (EGD);  Surgeon: Wilford Corner, MD;  Location: [facility];   Service: Gastroenterology;  Laterality: N/A;   ESOPHAGOSCOPY N/A 01/25/2022   Procedure: ESOPHAGOSCOPY;  Surgeon: Boyce Medici., MD;  Location: [facility];  Service: ENT;  Laterality: N/A;   FOREIGN BODY REMOVAL  01/25/2022   Procedure: FOREIGN BODY REMOVAL;  Surgeon: Wilford Corner, MD;  Location: [facility];  Service: Gastroenterology;;   [facility] Breed DIVERTICULECTOMY     operated/repaired   Social History   Occupational History   Not on file  Tobacco Use   Smoking status: Never   Smokeless tobacco: Never  Substance and Sexual Activity   Alcohol use: Never   Drug use: Not on file   Sexual activity: Not on file

## 2022-04-04 DIAGNOSIS — R279 Unspecified lack of coordination: Secondary | ICD-10-CM | POA: Diagnosis not present

## 2022-04-04 DIAGNOSIS — M6281 Muscle weakness (generalized): Secondary | ICD-10-CM | POA: Diagnosis not present

## 2022-04-04 DIAGNOSIS — R2689 Other abnormalities of gait and mobility: Secondary | ICD-10-CM | POA: Diagnosis not present

## 2022-04-05 ENCOUNTER — Telehealth: Payer: Self-pay | Admitting: Physician Assistant

## 2022-04-05 DIAGNOSIS — Z9181 History of falling: Secondary | ICD-10-CM | POA: Diagnosis not present

## 2022-04-05 DIAGNOSIS — M6281 Muscle weakness (generalized): Secondary | ICD-10-CM | POA: Diagnosis not present

## 2022-04-05 NOTE — Telephone Encounter (Signed)
Received vm from Christina Patton w/ [facility]. She requesting notes from last ov. She didn't leave fax number, IC to get fax number , na and voice mail not set up [phone]

## 2022-04-05 NOTE — Telephone Encounter (Signed)
Christina Patton called back, was given fax number. 04/03/22 ov note faxed [phone]

## 2022-04-09 DIAGNOSIS — R1314 Dysphagia, pharyngoesophageal phase: Secondary | ICD-10-CM | POA: Diagnosis not present

## 2022-04-09 DIAGNOSIS — R279 Unspecified lack of coordination: Secondary | ICD-10-CM | POA: Diagnosis not present

## 2022-04-09 DIAGNOSIS — R2689 Other abnormalities of gait and mobility: Secondary | ICD-10-CM | POA: Diagnosis not present

## 2022-04-09 DIAGNOSIS — M6281 Muscle weakness (generalized): Secondary | ICD-10-CM | POA: Diagnosis not present

## 2022-04-10 DIAGNOSIS — Z9181 History of falling: Secondary | ICD-10-CM | POA: Diagnosis not present

## 2022-04-10 DIAGNOSIS — M6281 Muscle weakness (generalized): Secondary | ICD-10-CM | POA: Diagnosis not present

## 2022-04-11 DIAGNOSIS — Z9181 History of falling: Secondary | ICD-10-CM | POA: Diagnosis not present

## 2022-04-11 DIAGNOSIS — M6281 Muscle weakness (generalized): Secondary | ICD-10-CM | POA: Diagnosis not present

## 2022-04-11 DIAGNOSIS — R2689 Other abnormalities of gait and mobility: Secondary | ICD-10-CM | POA: Diagnosis not present

## 2022-04-11 DIAGNOSIS — R279 Unspecified lack of coordination: Secondary | ICD-10-CM | POA: Diagnosis not present

## 2022-04-17 DIAGNOSIS — M6281 Muscle weakness (generalized): Secondary | ICD-10-CM | POA: Diagnosis not present

## 2022-04-17 DIAGNOSIS — Z9181 History of falling: Secondary | ICD-10-CM | POA: Diagnosis not present

## 2022-04-18 ENCOUNTER — Ambulatory Visit: Payer: Medicare HMO | Admitting: Orthopaedic Surgery

## 2022-04-18 DIAGNOSIS — M6281 Muscle weakness (generalized): Secondary | ICD-10-CM | POA: Diagnosis not present

## 2022-04-18 DIAGNOSIS — R279 Unspecified lack of coordination: Secondary | ICD-10-CM | POA: Diagnosis not present

## 2022-04-18 DIAGNOSIS — R2689 Other abnormalities of gait and mobility: Secondary | ICD-10-CM | POA: Diagnosis not present

## 2022-04-19 ENCOUNTER — Ambulatory Visit: Payer: Medicare HMO | Admitting: Orthopaedic Surgery

## 2022-04-19 ENCOUNTER — Encounter: Payer: Self-pay | Admitting: Orthopaedic Surgery

## 2022-04-19 ENCOUNTER — Ambulatory Visit (INDEPENDENT_AMBULATORY_CARE_PROVIDER_SITE_OTHER): Payer: Medicare HMO

## 2022-04-19 DIAGNOSIS — Z9181 History of falling: Secondary | ICD-10-CM | POA: Diagnosis not present

## 2022-04-19 DIAGNOSIS — S82141A Displaced bicondylar fracture of right tibia, initial encounter for closed fracture: Secondary | ICD-10-CM | POA: Diagnosis not present

## 2022-04-19 DIAGNOSIS — M6281 Muscle weakness (generalized): Secondary | ICD-10-CM | POA: Diagnosis not present

## 2022-04-19 NOTE — Progress Notes (Addendum)
Office Visit Note   Patient: Christina Patton           Date of Birth: 1945-09-08           MRN: [id number] Visit Date: 04/19/2022              Requested by: Cari Caraway, [place],  [place] [address] PCP: Cari Caraway, MD   Assessment & Plan: Visit Diagnoses:  1. Tibial plateau fracture, right, closed, initial encounter     Plan: Ms. Aust is a pleasant 76 year old woman who is now 7 weeks status post nondisplaced lateral tibial plateau fracture.  She is doing extremely well she does not have any pain.  She has no swelling or effusion.  X-rays demonstrate fracture to be healed.  She has full extension and flexion to 100 degrees of her knee.  Compartments of her lower leg are soft and nontender.  At this point she may progress to weightbearing as tolerated without the knee brace.  Obviously she walked to start with a walker and progressed to a cane which was her baseline prior to her injury.  She may follow-up with [place] as needed.  We will continue with PT at her rehab facility  Follow-Up Instructions: No follow-ups on file.   Orders:  Orders Placed This Encounter  Procedures   XR Knee 1-2 Views Right   No orders of the defined types were placed in this encounter.     Procedures: No procedures performed   Clinical Data: No additional findings.   Subjective: Chief Complaint  Patient presents with   Right Knee - Follow-up      Review of Systems  All other systems reviewed and are negative.    Objective: Vital Signs: There were no vitals taken for this visit.  Physical Exam Vitals reviewed.  Constitutional:      Appearance: Normal appearance.  Pulmonary:     Effort: Pulmonary effort is normal.  Skin:    General: Skin is warm and dry.  Neurological:     Mental Status: She is alert.     Ortho Exam Examination of her right knee no redness no effusion no warmth.  Compartments of the lower leg are soft and nontender distal sensation is  intact she has full extension and flexes to over 100 degrees with ease.  Nontender over the fracture site Specialty Comments:  No specialty comments available.  Imaging: XR Knee 1-2 Views Right  Result Date: 04/19/2022 2 view radiographs of her right knee were obtained today.  She has a nondisplaced lateral tibial plateau fracture.  She has good callus formation and it appears healed no other osseous    PMFS History: Patient Active Problem List   Diagnosis Date Noted   Tibial plateau fracture, right, closed, initial encounter 03/06/2022   Endotracheally intubated 01/27/2022   Food impaction of esophagus 01/25/2022   Esophageal diverticulum 01/25/2022   Hemoptysis 09/12/2016   Past Medical History:  Diagnosis Date   Anxiety    Cancer ([place])    skin cancer on face   Osteoporosis    Zenker's diverticulum     Family History  Problem Relation Age of Onset   Ovarian cancer Mother    Breast cancer Neg Hx     Past Surgical History:  Procedure Laterality Date   ESOPHAGOGASTRODUODENOSCOPY N/A 01/25/2022   Procedure: ESOPHAGOGASTRODUODENOSCOPY (EGD);  Surgeon: Wilford Corner, MD;  Location: [facility];  Service: Gastroenterology;  Laterality: N/A;   ESOPHAGOSCOPY N/A 01/25/2022  Procedure: ESOPHAGOSCOPY;  Surgeon: Boyce Medici., MD;  Location: [facility] OR;  Service: ENT;  Laterality: N/A;   FOREIGN BODY REMOVAL  01/25/2022   Procedure: FOREIGN BODY REMOVAL;  Surgeon: Wilford Corner, MD;  Location: [facility];  Service: Gastroenterology;;   [facility] DIVERTICULECTOMY     operated/repaired   Social History   Occupational History   Not on file  Tobacco Use   Smoking status: Never   Smokeless tobacco: Never  Substance and Sexual Activity   Alcohol use: Never   Drug use: Not on file   Sexual activity: Not on file

## 2022-04-19 NOTE — Progress Notes (Deleted)
   Office Visit Note   Patient: Christina Patton           Date of Birth: Jul 25, 1946           MRN: [id number] Visit Date: 04/19/2022              Requested by: Cari Caraway, [place],  [place] [address] PCP: Cari Caraway, MD   Assessment & Plan: Visit Diagnoses: No diagnosis found.  Plan: ***  Follow-Up Instructions: No follow-ups on file.   Orders:  No orders of the defined types were placed in this encounter.  No orders of the defined types were placed in this encounter.     Procedures: No procedures performed   Clinical Data: No additional findings.   Subjective: Chief Complaint  Patient presents with   Right Knee - Follow-up  Patient presents today for a two week follow up on her right knee tibial plateau fracture. She is in a wheelchair and wearing a brace today. She said that there has been no change since her last visit.   HPI  Review of Systems   Objective: Vital Signs: There were no vitals taken for this visit.  Physical Exam  Ortho Exam  Specialty Comments:  No specialty comments available.  Imaging: No results found.   PMFS History: Patient Active Problem List   Diagnosis Date Noted   Tibial plateau fracture, right, closed, initial encounter 03/06/2022   Endotracheally intubated 01/27/2022   Food impaction of esophagus 01/25/2022   Esophageal diverticulum 01/25/2022   Hemoptysis 09/12/2016   Past Medical History:  Diagnosis Date   Anxiety    Cancer ([place])    skin cancer on face   Osteoporosis    Zenker's diverticulum     Family History  Problem Relation Age of Onset   Ovarian cancer Mother    Breast cancer Neg Hx     Past Surgical History:  Procedure Laterality Date   ESOPHAGOGASTRODUODENOSCOPY N/A 01/25/2022   Procedure: ESOPHAGOGASTRODUODENOSCOPY (EGD);  Surgeon: Wilford Corner, MD;  Location: [facility];  Service: Gastroenterology;  Laterality: N/A;   ESOPHAGOSCOPY N/A 01/25/2022   Procedure:  ESOPHAGOSCOPY;  Surgeon: Boyce Medici., MD;  Location: [facility] OR;  Service: ENT;  Laterality: N/A;   FOREIGN BODY REMOVAL  01/25/2022   Procedure: FOREIGN BODY REMOVAL;  Surgeon: Wilford Corner, MD;  Location: [facility];  Service: Gastroenterology;;   [facility] DIVERTICULECTOMY     operated/repaired   Social History   Occupational History   Not on file  Tobacco Use   Smoking status: Never   Smokeless tobacco: Never  Substance and Sexual Activity   Alcohol use: Never   Drug use: Not on file   Sexual activity: Not on file

## 2022-04-20 DIAGNOSIS — M6281 Muscle weakness (generalized): Secondary | ICD-10-CM | POA: Diagnosis not present

## 2022-04-20 DIAGNOSIS — R279 Unspecified lack of coordination: Secondary | ICD-10-CM | POA: Diagnosis not present

## 2022-04-20 DIAGNOSIS — K2270 Barrett's esophagus without dysplasia: Secondary | ICD-10-CM | POA: Diagnosis not present

## 2022-04-20 DIAGNOSIS — K225 Diverticulum of esophagus, acquired: Secondary | ICD-10-CM | POA: Diagnosis not present

## 2022-04-20 DIAGNOSIS — K219 Gastro-esophageal reflux disease without esophagitis: Secondary | ICD-10-CM | POA: Diagnosis not present

## 2022-04-20 DIAGNOSIS — R2689 Other abnormalities of gait and mobility: Secondary | ICD-10-CM | POA: Diagnosis not present

## 2022-04-23 DIAGNOSIS — M6281 Muscle weakness (generalized): Secondary | ICD-10-CM | POA: Diagnosis not present

## 2022-04-23 DIAGNOSIS — R279 Unspecified lack of coordination: Secondary | ICD-10-CM | POA: Diagnosis not present

## 2022-04-23 DIAGNOSIS — R2689 Other abnormalities of gait and mobility: Secondary | ICD-10-CM | POA: Diagnosis not present

## 2022-04-25 DIAGNOSIS — M6281 Muscle weakness (generalized): Secondary | ICD-10-CM | POA: Diagnosis not present

## 2022-04-25 DIAGNOSIS — R2689 Other abnormalities of gait and mobility: Secondary | ICD-10-CM | POA: Diagnosis not present

## 2022-04-25 DIAGNOSIS — R279 Unspecified lack of coordination: Secondary | ICD-10-CM | POA: Diagnosis not present

## 2022-04-25 DIAGNOSIS — R1314 Dysphagia, pharyngoesophageal phase: Secondary | ICD-10-CM | POA: Diagnosis not present

## 2022-04-26 DIAGNOSIS — M6281 Muscle weakness (generalized): Secondary | ICD-10-CM | POA: Diagnosis not present

## 2022-04-26 DIAGNOSIS — Z9181 History of falling: Secondary | ICD-10-CM | POA: Diagnosis not present

## 2022-04-27 DIAGNOSIS — M6281 Muscle weakness (generalized): Secondary | ICD-10-CM | POA: Diagnosis not present

## 2022-04-27 DIAGNOSIS — R279 Unspecified lack of coordination: Secondary | ICD-10-CM | POA: Diagnosis not present

## 2022-04-27 DIAGNOSIS — R2689 Other abnormalities of gait and mobility: Secondary | ICD-10-CM | POA: Diagnosis not present

## 2022-04-30 DIAGNOSIS — R279 Unspecified lack of coordination: Secondary | ICD-10-CM | POA: Diagnosis not present

## 2022-04-30 DIAGNOSIS — Z9181 History of falling: Secondary | ICD-10-CM | POA: Diagnosis not present

## 2022-04-30 DIAGNOSIS — R2689 Other abnormalities of gait and mobility: Secondary | ICD-10-CM | POA: Diagnosis not present

## 2022-04-30 DIAGNOSIS — R1314 Dysphagia, pharyngoesophageal phase: Secondary | ICD-10-CM | POA: Diagnosis not present

## 2022-04-30 DIAGNOSIS — M6281 Muscle weakness (generalized): Secondary | ICD-10-CM | POA: Diagnosis not present

## 2022-05-01 DIAGNOSIS — Z9181 History of falling: Secondary | ICD-10-CM | POA: Diagnosis not present

## 2022-05-01 DIAGNOSIS — R1314 Dysphagia, pharyngoesophageal phase: Secondary | ICD-10-CM | POA: Diagnosis not present

## 2022-05-01 DIAGNOSIS — R2689 Other abnormalities of gait and mobility: Secondary | ICD-10-CM | POA: Diagnosis not present

## 2022-05-01 DIAGNOSIS — R279 Unspecified lack of coordination: Secondary | ICD-10-CM | POA: Diagnosis not present

## 2022-05-01 DIAGNOSIS — M6281 Muscle weakness (generalized): Secondary | ICD-10-CM | POA: Diagnosis not present

## 2022-05-02 DIAGNOSIS — M6281 Muscle weakness (generalized): Secondary | ICD-10-CM | POA: Diagnosis not present

## 2022-05-02 DIAGNOSIS — R279 Unspecified lack of coordination: Secondary | ICD-10-CM | POA: Diagnosis not present

## 2022-05-02 DIAGNOSIS — R1314 Dysphagia, pharyngoesophageal phase: Secondary | ICD-10-CM | POA: Diagnosis not present

## 2022-05-02 DIAGNOSIS — R2689 Other abnormalities of gait and mobility: Secondary | ICD-10-CM | POA: Diagnosis not present

## 2022-05-02 DIAGNOSIS — Z9181 History of falling: Secondary | ICD-10-CM | POA: Diagnosis not present

## 2022-05-03 DIAGNOSIS — R279 Unspecified lack of coordination: Secondary | ICD-10-CM | POA: Diagnosis not present

## 2022-05-03 DIAGNOSIS — Z9181 History of falling: Secondary | ICD-10-CM | POA: Diagnosis not present

## 2022-05-03 DIAGNOSIS — R1314 Dysphagia, pharyngoesophageal phase: Secondary | ICD-10-CM | POA: Diagnosis not present

## 2022-05-03 DIAGNOSIS — R2689 Other abnormalities of gait and mobility: Secondary | ICD-10-CM | POA: Diagnosis not present

## 2022-05-03 DIAGNOSIS — M6281 Muscle weakness (generalized): Secondary | ICD-10-CM | POA: Diagnosis not present

## 2022-05-04 DIAGNOSIS — M6281 Muscle weakness (generalized): Secondary | ICD-10-CM | POA: Diagnosis not present

## 2022-05-04 DIAGNOSIS — Z9181 History of falling: Secondary | ICD-10-CM | POA: Diagnosis not present

## 2022-05-04 DIAGNOSIS — R1314 Dysphagia, pharyngoesophageal phase: Secondary | ICD-10-CM | POA: Diagnosis not present

## 2022-05-04 DIAGNOSIS — R2689 Other abnormalities of gait and mobility: Secondary | ICD-10-CM | POA: Diagnosis not present

## 2022-05-04 DIAGNOSIS — R279 Unspecified lack of coordination: Secondary | ICD-10-CM | POA: Diagnosis not present

## 2022-05-08 DIAGNOSIS — Z8719 Personal history of other diseases of the digestive system: Secondary | ICD-10-CM | POA: Diagnosis not present

## 2022-05-08 DIAGNOSIS — K219 Gastro-esophageal reflux disease without esophagitis: Secondary | ICD-10-CM | POA: Diagnosis not present

## 2022-05-08 DIAGNOSIS — Z9889 Other specified postprocedural states: Secondary | ICD-10-CM | POA: Diagnosis not present

## 2022-05-08 DIAGNOSIS — K2270 Barrett's esophagus without dysplasia: Secondary | ICD-10-CM | POA: Diagnosis not present

## 2022-05-08 DIAGNOSIS — R279 Unspecified lack of coordination: Secondary | ICD-10-CM | POA: Diagnosis not present

## 2022-05-08 DIAGNOSIS — K2289 Other specified disease of esophagus: Secondary | ICD-10-CM | POA: Diagnosis not present

## 2022-05-08 DIAGNOSIS — Z9181 History of falling: Secondary | ICD-10-CM | POA: Diagnosis not present

## 2022-05-08 DIAGNOSIS — K225 Diverticulum of esophagus, acquired: Secondary | ICD-10-CM | POA: Diagnosis not present

## 2022-05-08 DIAGNOSIS — K2100 Gastro-esophageal reflux disease with esophagitis, without bleeding: Secondary | ICD-10-CM | POA: Diagnosis not present

## 2022-05-08 DIAGNOSIS — M6281 Muscle weakness (generalized): Secondary | ICD-10-CM | POA: Diagnosis not present

## 2022-05-08 DIAGNOSIS — R1314 Dysphagia, pharyngoesophageal phase: Secondary | ICD-10-CM | POA: Diagnosis not present

## 2022-05-08 DIAGNOSIS — R2689 Other abnormalities of gait and mobility: Secondary | ICD-10-CM | POA: Diagnosis not present

## 2022-05-09 DIAGNOSIS — R1314 Dysphagia, pharyngoesophageal phase: Secondary | ICD-10-CM | POA: Diagnosis not present

## 2022-05-09 DIAGNOSIS — R2689 Other abnormalities of gait and mobility: Secondary | ICD-10-CM | POA: Diagnosis not present

## 2022-05-09 DIAGNOSIS — R279 Unspecified lack of coordination: Secondary | ICD-10-CM | POA: Diagnosis not present

## 2022-05-09 DIAGNOSIS — M6281 Muscle weakness (generalized): Secondary | ICD-10-CM | POA: Diagnosis not present

## 2022-05-09 DIAGNOSIS — Z9181 History of falling: Secondary | ICD-10-CM | POA: Diagnosis not present

## 2022-05-10 DIAGNOSIS — R279 Unspecified lack of coordination: Secondary | ICD-10-CM | POA: Diagnosis not present

## 2022-05-10 DIAGNOSIS — R1314 Dysphagia, pharyngoesophageal phase: Secondary | ICD-10-CM | POA: Diagnosis not present

## 2022-05-10 DIAGNOSIS — M6281 Muscle weakness (generalized): Secondary | ICD-10-CM | POA: Diagnosis not present

## 2022-05-10 DIAGNOSIS — R2689 Other abnormalities of gait and mobility: Secondary | ICD-10-CM | POA: Diagnosis not present

## 2022-05-10 DIAGNOSIS — Z9181 History of falling: Secondary | ICD-10-CM | POA: Diagnosis not present

## 2022-05-11 DIAGNOSIS — Z9181 History of falling: Secondary | ICD-10-CM | POA: Diagnosis not present

## 2022-05-11 DIAGNOSIS — R279 Unspecified lack of coordination: Secondary | ICD-10-CM | POA: Diagnosis not present

## 2022-05-11 DIAGNOSIS — M6281 Muscle weakness (generalized): Secondary | ICD-10-CM | POA: Diagnosis not present

## 2022-05-11 DIAGNOSIS — R1314 Dysphagia, pharyngoesophageal phase: Secondary | ICD-10-CM | POA: Diagnosis not present

## 2022-05-11 DIAGNOSIS — R2689 Other abnormalities of gait and mobility: Secondary | ICD-10-CM | POA: Diagnosis not present

## 2022-05-14 DIAGNOSIS — Z9181 History of falling: Secondary | ICD-10-CM | POA: Diagnosis not present

## 2022-05-14 DIAGNOSIS — R1314 Dysphagia, pharyngoesophageal phase: Secondary | ICD-10-CM | POA: Diagnosis not present

## 2022-05-14 DIAGNOSIS — R279 Unspecified lack of coordination: Secondary | ICD-10-CM | POA: Diagnosis not present

## 2022-05-14 DIAGNOSIS — R2689 Other abnormalities of gait and mobility: Secondary | ICD-10-CM | POA: Diagnosis not present

## 2022-05-14 DIAGNOSIS — M6281 Muscle weakness (generalized): Secondary | ICD-10-CM | POA: Diagnosis not present

## 2022-05-15 DIAGNOSIS — M6281 Muscle weakness (generalized): Secondary | ICD-10-CM | POA: Diagnosis not present

## 2022-05-15 DIAGNOSIS — R279 Unspecified lack of coordination: Secondary | ICD-10-CM | POA: Diagnosis not present

## 2022-05-15 DIAGNOSIS — R2689 Other abnormalities of gait and mobility: Secondary | ICD-10-CM | POA: Diagnosis not present

## 2022-05-15 DIAGNOSIS — R1314 Dysphagia, pharyngoesophageal phase: Secondary | ICD-10-CM | POA: Diagnosis not present

## 2022-05-15 DIAGNOSIS — Z9181 History of falling: Secondary | ICD-10-CM | POA: Diagnosis not present

## 2022-05-16 DIAGNOSIS — R1314 Dysphagia, pharyngoesophageal phase: Secondary | ICD-10-CM | POA: Diagnosis not present

## 2022-05-16 DIAGNOSIS — Z9181 History of falling: Secondary | ICD-10-CM | POA: Diagnosis not present

## 2022-05-16 DIAGNOSIS — R279 Unspecified lack of coordination: Secondary | ICD-10-CM | POA: Diagnosis not present

## 2022-05-16 DIAGNOSIS — R2689 Other abnormalities of gait and mobility: Secondary | ICD-10-CM | POA: Diagnosis not present

## 2022-05-16 DIAGNOSIS — M6281 Muscle weakness (generalized): Secondary | ICD-10-CM | POA: Diagnosis not present

## 2022-05-17 DIAGNOSIS — R1314 Dysphagia, pharyngoesophageal phase: Secondary | ICD-10-CM | POA: Diagnosis not present

## 2022-05-17 DIAGNOSIS — M6281 Muscle weakness (generalized): Secondary | ICD-10-CM | POA: Diagnosis not present

## 2022-05-17 DIAGNOSIS — Z9181 History of falling: Secondary | ICD-10-CM | POA: Diagnosis not present

## 2022-05-17 DIAGNOSIS — R279 Unspecified lack of coordination: Secondary | ICD-10-CM | POA: Diagnosis not present

## 2022-05-17 DIAGNOSIS — R2689 Other abnormalities of gait and mobility: Secondary | ICD-10-CM | POA: Diagnosis not present

## 2022-05-22 DIAGNOSIS — R2689 Other abnormalities of gait and mobility: Secondary | ICD-10-CM | POA: Diagnosis not present

## 2022-05-22 DIAGNOSIS — Z9181 History of falling: Secondary | ICD-10-CM | POA: Diagnosis not present

## 2022-05-22 DIAGNOSIS — M6281 Muscle weakness (generalized): Secondary | ICD-10-CM | POA: Diagnosis not present

## 2022-05-22 DIAGNOSIS — R279 Unspecified lack of coordination: Secondary | ICD-10-CM | POA: Diagnosis not present

## 2022-05-22 DIAGNOSIS — R1314 Dysphagia, pharyngoesophageal phase: Secondary | ICD-10-CM | POA: Diagnosis not present

## 2022-05-24 DIAGNOSIS — R279 Unspecified lack of coordination: Secondary | ICD-10-CM | POA: Diagnosis not present

## 2022-05-24 DIAGNOSIS — R1314 Dysphagia, pharyngoesophageal phase: Secondary | ICD-10-CM | POA: Diagnosis not present

## 2022-05-24 DIAGNOSIS — M6281 Muscle weakness (generalized): Secondary | ICD-10-CM | POA: Diagnosis not present

## 2022-05-24 DIAGNOSIS — R2689 Other abnormalities of gait and mobility: Secondary | ICD-10-CM | POA: Diagnosis not present

## 2022-05-24 DIAGNOSIS — Z9181 History of falling: Secondary | ICD-10-CM | POA: Diagnosis not present

## 2022-05-25 DIAGNOSIS — R279 Unspecified lack of coordination: Secondary | ICD-10-CM | POA: Diagnosis not present

## 2022-05-25 DIAGNOSIS — R1314 Dysphagia, pharyngoesophageal phase: Secondary | ICD-10-CM | POA: Diagnosis not present

## 2022-05-25 DIAGNOSIS — R2689 Other abnormalities of gait and mobility: Secondary | ICD-10-CM | POA: Diagnosis not present

## 2022-05-25 DIAGNOSIS — Z9181 History of falling: Secondary | ICD-10-CM | POA: Diagnosis not present

## 2022-05-25 DIAGNOSIS — M6281 Muscle weakness (generalized): Secondary | ICD-10-CM | POA: Diagnosis not present

## 2022-05-28 DIAGNOSIS — Z9181 History of falling: Secondary | ICD-10-CM | POA: Diagnosis not present

## 2022-05-28 DIAGNOSIS — R2689 Other abnormalities of gait and mobility: Secondary | ICD-10-CM | POA: Diagnosis not present

## 2022-05-28 DIAGNOSIS — R1314 Dysphagia, pharyngoesophageal phase: Secondary | ICD-10-CM | POA: Diagnosis not present

## 2022-05-28 DIAGNOSIS — M6281 Muscle weakness (generalized): Secondary | ICD-10-CM | POA: Diagnosis not present

## 2022-05-28 DIAGNOSIS — R279 Unspecified lack of coordination: Secondary | ICD-10-CM | POA: Diagnosis not present

## 2022-05-29 DIAGNOSIS — R2689 Other abnormalities of gait and mobility: Secondary | ICD-10-CM | POA: Diagnosis not present

## 2022-05-29 DIAGNOSIS — M6281 Muscle weakness (generalized): Secondary | ICD-10-CM | POA: Diagnosis not present

## 2022-05-29 DIAGNOSIS — Z9181 History of falling: Secondary | ICD-10-CM | POA: Diagnosis not present

## 2022-05-29 DIAGNOSIS — R279 Unspecified lack of coordination: Secondary | ICD-10-CM | POA: Diagnosis not present

## 2022-05-29 DIAGNOSIS — R1314 Dysphagia, pharyngoesophageal phase: Secondary | ICD-10-CM | POA: Diagnosis not present

## 2022-05-31 DIAGNOSIS — Z9181 History of falling: Secondary | ICD-10-CM | POA: Diagnosis not present

## 2022-05-31 DIAGNOSIS — R2689 Other abnormalities of gait and mobility: Secondary | ICD-10-CM | POA: Diagnosis not present

## 2022-05-31 DIAGNOSIS — M6281 Muscle weakness (generalized): Secondary | ICD-10-CM | POA: Diagnosis not present

## 2022-05-31 DIAGNOSIS — R279 Unspecified lack of coordination: Secondary | ICD-10-CM | POA: Diagnosis not present

## 2022-06-01 DIAGNOSIS — R2689 Other abnormalities of gait and mobility: Secondary | ICD-10-CM | POA: Diagnosis not present

## 2022-06-01 DIAGNOSIS — R279 Unspecified lack of coordination: Secondary | ICD-10-CM | POA: Diagnosis not present

## 2022-06-01 DIAGNOSIS — Z9181 History of falling: Secondary | ICD-10-CM | POA: Diagnosis not present

## 2022-06-01 DIAGNOSIS — M6281 Muscle weakness (generalized): Secondary | ICD-10-CM | POA: Diagnosis not present

## 2022-06-04 DIAGNOSIS — M6281 Muscle weakness (generalized): Secondary | ICD-10-CM | POA: Diagnosis not present

## 2022-06-04 DIAGNOSIS — R2689 Other abnormalities of gait and mobility: Secondary | ICD-10-CM | POA: Diagnosis not present

## 2022-06-04 DIAGNOSIS — R279 Unspecified lack of coordination: Secondary | ICD-10-CM | POA: Diagnosis not present

## 2022-06-04 DIAGNOSIS — Z9181 History of falling: Secondary | ICD-10-CM | POA: Diagnosis not present

## 2022-06-07 DIAGNOSIS — L814 Other melanin hyperpigmentation: Secondary | ICD-10-CM | POA: Diagnosis not present

## 2022-06-07 DIAGNOSIS — L821 Other seborrheic keratosis: Secondary | ICD-10-CM | POA: Diagnosis not present

## 2022-06-07 DIAGNOSIS — D1801 Hemangioma of skin and subcutaneous tissue: Secondary | ICD-10-CM | POA: Diagnosis not present

## 2022-06-07 DIAGNOSIS — L309 Dermatitis, unspecified: Secondary | ICD-10-CM | POA: Diagnosis not present

## 2022-06-07 DIAGNOSIS — Z419 Encounter for procedure for purposes other than remedying health state, unspecified: Secondary | ICD-10-CM | POA: Diagnosis not present

## 2022-06-08 DIAGNOSIS — R279 Unspecified lack of coordination: Secondary | ICD-10-CM | POA: Diagnosis not present

## 2022-06-08 DIAGNOSIS — R2689 Other abnormalities of gait and mobility: Secondary | ICD-10-CM | POA: Diagnosis not present

## 2022-06-08 DIAGNOSIS — M6281 Muscle weakness (generalized): Secondary | ICD-10-CM | POA: Diagnosis not present

## 2022-06-08 DIAGNOSIS — Z9181 History of falling: Secondary | ICD-10-CM | POA: Diagnosis not present

## 2022-06-09 DIAGNOSIS — Z9181 History of falling: Secondary | ICD-10-CM | POA: Diagnosis not present

## 2022-06-09 DIAGNOSIS — R2689 Other abnormalities of gait and mobility: Secondary | ICD-10-CM | POA: Diagnosis not present

## 2022-06-09 DIAGNOSIS — M6281 Muscle weakness (generalized): Secondary | ICD-10-CM | POA: Diagnosis not present

## 2022-06-09 DIAGNOSIS — R279 Unspecified lack of coordination: Secondary | ICD-10-CM | POA: Diagnosis not present

## 2022-06-11 DIAGNOSIS — R2689 Other abnormalities of gait and mobility: Secondary | ICD-10-CM | POA: Diagnosis not present

## 2022-06-11 DIAGNOSIS — Z9181 History of falling: Secondary | ICD-10-CM | POA: Diagnosis not present

## 2022-06-11 DIAGNOSIS — M6281 Muscle weakness (generalized): Secondary | ICD-10-CM | POA: Diagnosis not present

## 2022-06-11 DIAGNOSIS — R279 Unspecified lack of coordination: Secondary | ICD-10-CM | POA: Diagnosis not present

## 2022-06-14 DIAGNOSIS — Z9181 History of falling: Secondary | ICD-10-CM | POA: Diagnosis not present

## 2022-06-14 DIAGNOSIS — M6281 Muscle weakness (generalized): Secondary | ICD-10-CM | POA: Diagnosis not present

## 2022-06-14 DIAGNOSIS — R279 Unspecified lack of coordination: Secondary | ICD-10-CM | POA: Diagnosis not present

## 2022-06-14 DIAGNOSIS — R2689 Other abnormalities of gait and mobility: Secondary | ICD-10-CM | POA: Diagnosis not present

## 2022-06-15 DIAGNOSIS — R2689 Other abnormalities of gait and mobility: Secondary | ICD-10-CM | POA: Diagnosis not present

## 2022-06-15 DIAGNOSIS — Z9181 History of falling: Secondary | ICD-10-CM | POA: Diagnosis not present

## 2022-06-15 DIAGNOSIS — M6281 Muscle weakness (generalized): Secondary | ICD-10-CM | POA: Diagnosis not present

## 2022-06-15 DIAGNOSIS — R279 Unspecified lack of coordination: Secondary | ICD-10-CM | POA: Diagnosis not present

## 2022-06-18 DIAGNOSIS — H524 Presbyopia: Secondary | ICD-10-CM | POA: Diagnosis not present

## 2022-06-18 DIAGNOSIS — H40013 Open angle with borderline findings, low risk, bilateral: Secondary | ICD-10-CM | POA: Diagnosis not present

## 2022-06-18 DIAGNOSIS — H5010 Unspecified exotropia: Secondary | ICD-10-CM | POA: Diagnosis not present

## 2022-06-19 DIAGNOSIS — R279 Unspecified lack of coordination: Secondary | ICD-10-CM | POA: Diagnosis not present

## 2022-06-19 DIAGNOSIS — Z9181 History of falling: Secondary | ICD-10-CM | POA: Diagnosis not present

## 2022-06-19 DIAGNOSIS — R2689 Other abnormalities of gait and mobility: Secondary | ICD-10-CM | POA: Diagnosis not present

## 2022-06-19 DIAGNOSIS — M6281 Muscle weakness (generalized): Secondary | ICD-10-CM | POA: Diagnosis not present

## 2022-06-22 DIAGNOSIS — Z9181 History of falling: Secondary | ICD-10-CM | POA: Diagnosis not present

## 2022-06-22 DIAGNOSIS — R279 Unspecified lack of coordination: Secondary | ICD-10-CM | POA: Diagnosis not present

## 2022-06-22 DIAGNOSIS — R2689 Other abnormalities of gait and mobility: Secondary | ICD-10-CM | POA: Diagnosis not present

## 2022-06-22 DIAGNOSIS — M6281 Muscle weakness (generalized): Secondary | ICD-10-CM | POA: Diagnosis not present

## 2022-06-26 DIAGNOSIS — R279 Unspecified lack of coordination: Secondary | ICD-10-CM | POA: Diagnosis not present

## 2022-06-26 DIAGNOSIS — Z9181 History of falling: Secondary | ICD-10-CM | POA: Diagnosis not present

## 2022-06-26 DIAGNOSIS — R2689 Other abnormalities of gait and mobility: Secondary | ICD-10-CM | POA: Diagnosis not present

## 2022-06-26 DIAGNOSIS — M6281 Muscle weakness (generalized): Secondary | ICD-10-CM | POA: Diagnosis not present

## 2022-07-03 DIAGNOSIS — K2100 Gastro-esophageal reflux disease with esophagitis, without bleeding: Secondary | ICD-10-CM | POA: Diagnosis not present

## 2022-07-03 DIAGNOSIS — R2689 Other abnormalities of gait and mobility: Secondary | ICD-10-CM | POA: Diagnosis not present

## 2022-07-03 DIAGNOSIS — Z9889 Other specified postprocedural states: Secondary | ICD-10-CM | POA: Diagnosis not present

## 2022-07-03 DIAGNOSIS — M6281 Muscle weakness (generalized): Secondary | ICD-10-CM | POA: Diagnosis not present

## 2022-07-03 DIAGNOSIS — Z8719 Personal history of other diseases of the digestive system: Secondary | ICD-10-CM | POA: Diagnosis not present

## 2022-07-03 DIAGNOSIS — R279 Unspecified lack of coordination: Secondary | ICD-10-CM | POA: Diagnosis not present

## 2022-07-06 DIAGNOSIS — M6281 Muscle weakness (generalized): Secondary | ICD-10-CM | POA: Diagnosis not present

## 2022-07-06 DIAGNOSIS — R2689 Other abnormalities of gait and mobility: Secondary | ICD-10-CM | POA: Diagnosis not present

## 2022-07-06 DIAGNOSIS — R279 Unspecified lack of coordination: Secondary | ICD-10-CM | POA: Diagnosis not present

## 2022-07-09 DIAGNOSIS — M6281 Muscle weakness (generalized): Secondary | ICD-10-CM | POA: Diagnosis not present

## 2022-07-09 DIAGNOSIS — R279 Unspecified lack of coordination: Secondary | ICD-10-CM | POA: Diagnosis not present

## 2022-07-09 DIAGNOSIS — R2689 Other abnormalities of gait and mobility: Secondary | ICD-10-CM | POA: Diagnosis not present

## 2022-07-13 DIAGNOSIS — M6281 Muscle weakness (generalized): Secondary | ICD-10-CM | POA: Diagnosis not present

## 2022-07-13 DIAGNOSIS — R279 Unspecified lack of coordination: Secondary | ICD-10-CM | POA: Diagnosis not present

## 2022-07-13 DIAGNOSIS — R2689 Other abnormalities of gait and mobility: Secondary | ICD-10-CM | POA: Diagnosis not present

## 2022-07-16 DIAGNOSIS — I82401 Acute embolism and thrombosis of unspecified deep veins of right lower extremity: Secondary | ICD-10-CM | POA: Diagnosis not present

## 2022-07-16 DIAGNOSIS — U071 COVID-19: Secondary | ICD-10-CM | POA: Diagnosis not present

## 2022-07-16 DIAGNOSIS — G4700 Insomnia, unspecified: Secondary | ICD-10-CM | POA: Diagnosis not present

## 2022-07-16 DIAGNOSIS — R059 Cough, unspecified: Secondary | ICD-10-CM | POA: Diagnosis not present

## 2022-07-16 DIAGNOSIS — R69 Illness, unspecified: Secondary | ICD-10-CM | POA: Diagnosis not present

## 2022-07-16 DIAGNOSIS — M810 Age-related osteoporosis without current pathological fracture: Secondary | ICD-10-CM | POA: Diagnosis not present

## 2022-07-16 DIAGNOSIS — R21 Rash and other nonspecific skin eruption: Secondary | ICD-10-CM | POA: Diagnosis not present

## 2022-07-16 DIAGNOSIS — Z8719 Personal history of other diseases of the digestive system: Secondary | ICD-10-CM | POA: Diagnosis not present

## 2022-07-26 DIAGNOSIS — R279 Unspecified lack of coordination: Secondary | ICD-10-CM | POA: Diagnosis not present

## 2022-07-26 DIAGNOSIS — R2689 Other abnormalities of gait and mobility: Secondary | ICD-10-CM | POA: Diagnosis not present

## 2022-07-26 DIAGNOSIS — M6281 Muscle weakness (generalized): Secondary | ICD-10-CM | POA: Diagnosis not present

## 2022-07-27 DIAGNOSIS — R2689 Other abnormalities of gait and mobility: Secondary | ICD-10-CM | POA: Diagnosis not present

## 2022-07-27 DIAGNOSIS — R279 Unspecified lack of coordination: Secondary | ICD-10-CM | POA: Diagnosis not present

## 2022-07-27 DIAGNOSIS — M6281 Muscle weakness (generalized): Secondary | ICD-10-CM | POA: Diagnosis not present

## 2022-08-01 DIAGNOSIS — R279 Unspecified lack of coordination: Secondary | ICD-10-CM | POA: Diagnosis not present

## 2022-08-01 DIAGNOSIS — R2689 Other abnormalities of gait and mobility: Secondary | ICD-10-CM | POA: Diagnosis not present

## 2022-08-01 DIAGNOSIS — M6281 Muscle weakness (generalized): Secondary | ICD-10-CM | POA: Diagnosis not present

## 2022-08-07 DIAGNOSIS — M6281 Muscle weakness (generalized): Secondary | ICD-10-CM | POA: Diagnosis not present

## 2022-08-07 DIAGNOSIS — R2689 Other abnormalities of gait and mobility: Secondary | ICD-10-CM | POA: Diagnosis not present

## 2022-08-07 DIAGNOSIS — R279 Unspecified lack of coordination: Secondary | ICD-10-CM | POA: Diagnosis not present

## 2022-08-09 DIAGNOSIS — L2089 Other atopic dermatitis: Secondary | ICD-10-CM | POA: Diagnosis not present

## 2022-08-09 DIAGNOSIS — L821 Other seborrheic keratosis: Secondary | ICD-10-CM | POA: Diagnosis not present

## 2022-08-09 DIAGNOSIS — D1801 Hemangioma of skin and subcutaneous tissue: Secondary | ICD-10-CM | POA: Diagnosis not present

## 2022-08-09 DIAGNOSIS — Z85828 Personal history of other malignant neoplasm of skin: Secondary | ICD-10-CM | POA: Diagnosis not present

## 2022-08-09 DIAGNOSIS — L814 Other melanin hyperpigmentation: Secondary | ICD-10-CM | POA: Diagnosis not present

## 2022-08-10 DIAGNOSIS — R2689 Other abnormalities of gait and mobility: Secondary | ICD-10-CM | POA: Diagnosis not present

## 2022-08-10 DIAGNOSIS — M6281 Muscle weakness (generalized): Secondary | ICD-10-CM | POA: Diagnosis not present

## 2022-08-10 DIAGNOSIS — R279 Unspecified lack of coordination: Secondary | ICD-10-CM | POA: Diagnosis not present

## 2022-08-15 DIAGNOSIS — R2689 Other abnormalities of gait and mobility: Secondary | ICD-10-CM | POA: Diagnosis not present

## 2022-08-15 DIAGNOSIS — M6281 Muscle weakness (generalized): Secondary | ICD-10-CM | POA: Diagnosis not present

## 2022-08-15 DIAGNOSIS — R279 Unspecified lack of coordination: Secondary | ICD-10-CM | POA: Diagnosis not present

## 2022-08-20 DIAGNOSIS — R2689 Other abnormalities of gait and mobility: Secondary | ICD-10-CM | POA: Diagnosis not present

## 2022-08-20 DIAGNOSIS — R279 Unspecified lack of coordination: Secondary | ICD-10-CM | POA: Diagnosis not present

## 2022-08-20 DIAGNOSIS — M6281 Muscle weakness (generalized): Secondary | ICD-10-CM | POA: Diagnosis not present

## 2022-08-22 DIAGNOSIS — R279 Unspecified lack of coordination: Secondary | ICD-10-CM | POA: Diagnosis not present

## 2022-08-22 DIAGNOSIS — K2270 Barrett's esophagus without dysplasia: Secondary | ICD-10-CM | POA: Diagnosis not present

## 2022-08-22 DIAGNOSIS — R69 Illness, unspecified: Secondary | ICD-10-CM | POA: Diagnosis not present

## 2022-08-22 DIAGNOSIS — Z6821 Body mass index (BMI) 21.0-21.9, adult: Secondary | ICD-10-CM | POA: Diagnosis not present

## 2022-08-22 DIAGNOSIS — M810 Age-related osteoporosis without current pathological fracture: Secondary | ICD-10-CM | POA: Diagnosis not present

## 2022-08-22 DIAGNOSIS — F411 Generalized anxiety disorder: Secondary | ICD-10-CM | POA: Diagnosis not present

## 2022-08-22 DIAGNOSIS — R2689 Other abnormalities of gait and mobility: Secondary | ICD-10-CM | POA: Diagnosis not present

## 2022-08-22 DIAGNOSIS — M6281 Muscle weakness (generalized): Secondary | ICD-10-CM | POA: Diagnosis not present

## 2022-08-22 DIAGNOSIS — M2550 Pain in unspecified joint: Secondary | ICD-10-CM | POA: Diagnosis not present

## 2022-08-22 DIAGNOSIS — K209 Esophagitis, unspecified without bleeding: Secondary | ICD-10-CM | POA: Diagnosis not present

## 2022-08-22 DIAGNOSIS — F331 Major depressive disorder, recurrent, moderate: Secondary | ICD-10-CM | POA: Diagnosis not present

## 2022-08-22 DIAGNOSIS — H9193 Unspecified hearing loss, bilateral: Secondary | ICD-10-CM | POA: Diagnosis not present

## 2022-08-22 DIAGNOSIS — G4700 Insomnia, unspecified: Secondary | ICD-10-CM | POA: Diagnosis not present

## 2022-08-27 DIAGNOSIS — R279 Unspecified lack of coordination: Secondary | ICD-10-CM | POA: Diagnosis not present

## 2022-08-27 DIAGNOSIS — R2689 Other abnormalities of gait and mobility: Secondary | ICD-10-CM | POA: Diagnosis not present

## 2022-08-27 DIAGNOSIS — M6281 Muscle weakness (generalized): Secondary | ICD-10-CM | POA: Diagnosis not present

## 2022-08-30 DIAGNOSIS — R279 Unspecified lack of coordination: Secondary | ICD-10-CM | POA: Diagnosis not present

## 2022-08-30 DIAGNOSIS — M6281 Muscle weakness (generalized): Secondary | ICD-10-CM | POA: Diagnosis not present

## 2022-08-30 DIAGNOSIS — R2689 Other abnormalities of gait and mobility: Secondary | ICD-10-CM | POA: Diagnosis not present

## 2022-09-04 DIAGNOSIS — M6281 Muscle weakness (generalized): Secondary | ICD-10-CM | POA: Diagnosis not present

## 2022-09-04 DIAGNOSIS — R279 Unspecified lack of coordination: Secondary | ICD-10-CM | POA: Diagnosis not present

## 2022-09-04 DIAGNOSIS — R2689 Other abnormalities of gait and mobility: Secondary | ICD-10-CM | POA: Diagnosis not present

## 2022-09-07 DIAGNOSIS — R2689 Other abnormalities of gait and mobility: Secondary | ICD-10-CM | POA: Diagnosis not present

## 2022-09-07 DIAGNOSIS — M6281 Muscle weakness (generalized): Secondary | ICD-10-CM | POA: Diagnosis not present

## 2022-09-07 DIAGNOSIS — R279 Unspecified lack of coordination: Secondary | ICD-10-CM | POA: Diagnosis not present

## 2022-09-12 DIAGNOSIS — M6281 Muscle weakness (generalized): Secondary | ICD-10-CM | POA: Diagnosis not present

## 2022-09-12 DIAGNOSIS — R279 Unspecified lack of coordination: Secondary | ICD-10-CM | POA: Diagnosis not present

## 2022-09-12 DIAGNOSIS — R2689 Other abnormalities of gait and mobility: Secondary | ICD-10-CM | POA: Diagnosis not present

## 2022-09-13 DIAGNOSIS — M6281 Muscle weakness (generalized): Secondary | ICD-10-CM | POA: Diagnosis not present

## 2022-09-13 DIAGNOSIS — R2689 Other abnormalities of gait and mobility: Secondary | ICD-10-CM | POA: Diagnosis not present

## 2022-09-13 DIAGNOSIS — R279 Unspecified lack of coordination: Secondary | ICD-10-CM | POA: Diagnosis not present

## 2022-09-14 DIAGNOSIS — M6281 Muscle weakness (generalized): Secondary | ICD-10-CM | POA: Diagnosis not present

## 2022-09-14 DIAGNOSIS — R279 Unspecified lack of coordination: Secondary | ICD-10-CM | POA: Diagnosis not present

## 2022-09-14 DIAGNOSIS — R2689 Other abnormalities of gait and mobility: Secondary | ICD-10-CM | POA: Diagnosis not present

## 2022-09-20 DIAGNOSIS — R279 Unspecified lack of coordination: Secondary | ICD-10-CM | POA: Diagnosis not present

## 2022-09-20 DIAGNOSIS — R2689 Other abnormalities of gait and mobility: Secondary | ICD-10-CM | POA: Diagnosis not present

## 2022-09-20 DIAGNOSIS — M6281 Muscle weakness (generalized): Secondary | ICD-10-CM | POA: Diagnosis not present

## 2022-09-21 DIAGNOSIS — M6281 Muscle weakness (generalized): Secondary | ICD-10-CM | POA: Diagnosis not present

## 2022-09-21 DIAGNOSIS — R2689 Other abnormalities of gait and mobility: Secondary | ICD-10-CM | POA: Diagnosis not present

## 2022-09-21 DIAGNOSIS — R279 Unspecified lack of coordination: Secondary | ICD-10-CM | POA: Diagnosis not present

## 2022-09-24 DIAGNOSIS — M6281 Muscle weakness (generalized): Secondary | ICD-10-CM | POA: Diagnosis not present

## 2022-09-24 DIAGNOSIS — R279 Unspecified lack of coordination: Secondary | ICD-10-CM | POA: Diagnosis not present

## 2022-09-24 DIAGNOSIS — R2689 Other abnormalities of gait and mobility: Secondary | ICD-10-CM | POA: Diagnosis not present

## 2022-09-28 DIAGNOSIS — R2689 Other abnormalities of gait and mobility: Secondary | ICD-10-CM | POA: Diagnosis not present

## 2022-09-28 DIAGNOSIS — R279 Unspecified lack of coordination: Secondary | ICD-10-CM | POA: Diagnosis not present

## 2022-09-28 DIAGNOSIS — M6281 Muscle weakness (generalized): Secondary | ICD-10-CM | POA: Diagnosis not present

## 2022-10-02 DIAGNOSIS — R279 Unspecified lack of coordination: Secondary | ICD-10-CM | POA: Diagnosis not present

## 2022-10-02 DIAGNOSIS — M6281 Muscle weakness (generalized): Secondary | ICD-10-CM | POA: Diagnosis not present

## 2022-10-02 DIAGNOSIS — R2689 Other abnormalities of gait and mobility: Secondary | ICD-10-CM | POA: Diagnosis not present

## 2022-10-05 DIAGNOSIS — M6281 Muscle weakness (generalized): Secondary | ICD-10-CM | POA: Diagnosis not present

## 2022-10-05 DIAGNOSIS — R279 Unspecified lack of coordination: Secondary | ICD-10-CM | POA: Diagnosis not present

## 2022-10-05 DIAGNOSIS — R2689 Other abnormalities of gait and mobility: Secondary | ICD-10-CM | POA: Diagnosis not present

## 2022-10-09 DIAGNOSIS — R279 Unspecified lack of coordination: Secondary | ICD-10-CM | POA: Diagnosis not present

## 2022-10-09 DIAGNOSIS — R2689 Other abnormalities of gait and mobility: Secondary | ICD-10-CM | POA: Diagnosis not present

## 2022-10-09 DIAGNOSIS — M6281 Muscle weakness (generalized): Secondary | ICD-10-CM | POA: Diagnosis not present

## 2022-10-10 DIAGNOSIS — M810 Age-related osteoporosis without current pathological fracture: Secondary | ICD-10-CM | POA: Diagnosis not present

## 2022-10-12 DIAGNOSIS — R279 Unspecified lack of coordination: Secondary | ICD-10-CM | POA: Diagnosis not present

## 2022-10-12 DIAGNOSIS — M6281 Muscle weakness (generalized): Secondary | ICD-10-CM | POA: Diagnosis not present

## 2022-10-12 DIAGNOSIS — R2689 Other abnormalities of gait and mobility: Secondary | ICD-10-CM | POA: Diagnosis not present

## 2022-10-15 DIAGNOSIS — R2689 Other abnormalities of gait and mobility: Secondary | ICD-10-CM | POA: Diagnosis not present

## 2022-10-15 DIAGNOSIS — M6281 Muscle weakness (generalized): Secondary | ICD-10-CM | POA: Diagnosis not present

## 2022-10-15 DIAGNOSIS — R279 Unspecified lack of coordination: Secondary | ICD-10-CM | POA: Diagnosis not present

## 2022-10-22 DIAGNOSIS — M6281 Muscle weakness (generalized): Secondary | ICD-10-CM | POA: Diagnosis not present

## 2022-10-22 DIAGNOSIS — R2689 Other abnormalities of gait and mobility: Secondary | ICD-10-CM | POA: Diagnosis not present

## 2022-10-22 DIAGNOSIS — R279 Unspecified lack of coordination: Secondary | ICD-10-CM | POA: Diagnosis not present

## 2022-10-24 DIAGNOSIS — M6281 Muscle weakness (generalized): Secondary | ICD-10-CM | POA: Diagnosis not present

## 2022-10-24 DIAGNOSIS — R279 Unspecified lack of coordination: Secondary | ICD-10-CM | POA: Diagnosis not present

## 2022-10-24 DIAGNOSIS — R2689 Other abnormalities of gait and mobility: Secondary | ICD-10-CM | POA: Diagnosis not present

## 2022-10-29 DIAGNOSIS — R279 Unspecified lack of coordination: Secondary | ICD-10-CM | POA: Diagnosis not present

## 2022-10-29 DIAGNOSIS — R2689 Other abnormalities of gait and mobility: Secondary | ICD-10-CM | POA: Diagnosis not present

## 2022-10-29 DIAGNOSIS — M6259 Muscle wasting and atrophy, not elsewhere classified, multiple sites: Secondary | ICD-10-CM | POA: Diagnosis not present

## 2022-10-29 DIAGNOSIS — M6281 Muscle weakness (generalized): Secondary | ICD-10-CM | POA: Diagnosis not present

## 2022-11-02 DIAGNOSIS — R279 Unspecified lack of coordination: Secondary | ICD-10-CM | POA: Diagnosis not present

## 2022-11-02 DIAGNOSIS — M6259 Muscle wasting and atrophy, not elsewhere classified, multiple sites: Secondary | ICD-10-CM | POA: Diagnosis not present

## 2022-11-02 DIAGNOSIS — R2689 Other abnormalities of gait and mobility: Secondary | ICD-10-CM | POA: Diagnosis not present

## 2022-11-02 DIAGNOSIS — M6281 Muscle weakness (generalized): Secondary | ICD-10-CM | POA: Diagnosis not present

## 2022-11-06 DIAGNOSIS — M6281 Muscle weakness (generalized): Secondary | ICD-10-CM | POA: Diagnosis not present

## 2022-11-06 DIAGNOSIS — R2689 Other abnormalities of gait and mobility: Secondary | ICD-10-CM | POA: Diagnosis not present

## 2022-11-06 DIAGNOSIS — M6259 Muscle wasting and atrophy, not elsewhere classified, multiple sites: Secondary | ICD-10-CM | POA: Diagnosis not present

## 2022-11-06 DIAGNOSIS — R279 Unspecified lack of coordination: Secondary | ICD-10-CM | POA: Diagnosis not present

## 2022-11-14 DIAGNOSIS — R279 Unspecified lack of coordination: Secondary | ICD-10-CM | POA: Diagnosis not present

## 2022-11-14 DIAGNOSIS — M6259 Muscle wasting and atrophy, not elsewhere classified, multiple sites: Secondary | ICD-10-CM | POA: Diagnosis not present

## 2022-11-14 DIAGNOSIS — R2689 Other abnormalities of gait and mobility: Secondary | ICD-10-CM | POA: Diagnosis not present

## 2022-11-14 DIAGNOSIS — M6281 Muscle weakness (generalized): Secondary | ICD-10-CM | POA: Diagnosis not present

## 2022-11-16 DIAGNOSIS — R279 Unspecified lack of coordination: Secondary | ICD-10-CM | POA: Diagnosis not present

## 2022-11-16 DIAGNOSIS — M6281 Muscle weakness (generalized): Secondary | ICD-10-CM | POA: Diagnosis not present

## 2022-11-16 DIAGNOSIS — R2689 Other abnormalities of gait and mobility: Secondary | ICD-10-CM | POA: Diagnosis not present

## 2022-11-16 DIAGNOSIS — M6259 Muscle wasting and atrophy, not elsewhere classified, multiple sites: Secondary | ICD-10-CM | POA: Diagnosis not present

## 2022-11-20 DIAGNOSIS — M6281 Muscle weakness (generalized): Secondary | ICD-10-CM | POA: Diagnosis not present

## 2022-11-20 DIAGNOSIS — M6259 Muscle wasting and atrophy, not elsewhere classified, multiple sites: Secondary | ICD-10-CM | POA: Diagnosis not present

## 2022-11-20 DIAGNOSIS — R279 Unspecified lack of coordination: Secondary | ICD-10-CM | POA: Diagnosis not present

## 2022-11-20 DIAGNOSIS — R2689 Other abnormalities of gait and mobility: Secondary | ICD-10-CM | POA: Diagnosis not present

## 2022-11-21 DIAGNOSIS — M6281 Muscle weakness (generalized): Secondary | ICD-10-CM | POA: Diagnosis not present

## 2022-11-21 DIAGNOSIS — M6259 Muscle wasting and atrophy, not elsewhere classified, multiple sites: Secondary | ICD-10-CM | POA: Diagnosis not present

## 2022-11-21 DIAGNOSIS — R2689 Other abnormalities of gait and mobility: Secondary | ICD-10-CM | POA: Diagnosis not present

## 2022-11-21 DIAGNOSIS — R279 Unspecified lack of coordination: Secondary | ICD-10-CM | POA: Diagnosis not present

## 2022-11-23 DIAGNOSIS — Z6821 Body mass index (BMI) 21.0-21.9, adult: Secondary | ICD-10-CM | POA: Diagnosis not present

## 2022-11-23 DIAGNOSIS — R3915 Urgency of urination: Secondary | ICD-10-CM | POA: Diagnosis not present

## 2022-11-27 DIAGNOSIS — M6259 Muscle wasting and atrophy, not elsewhere classified, multiple sites: Secondary | ICD-10-CM | POA: Diagnosis not present

## 2022-11-27 DIAGNOSIS — R2689 Other abnormalities of gait and mobility: Secondary | ICD-10-CM | POA: Diagnosis not present

## 2022-11-27 DIAGNOSIS — R279 Unspecified lack of coordination: Secondary | ICD-10-CM | POA: Diagnosis not present

## 2022-11-27 DIAGNOSIS — M6281 Muscle weakness (generalized): Secondary | ICD-10-CM | POA: Diagnosis not present

## 2022-11-29 DIAGNOSIS — M6259 Muscle wasting and atrophy, not elsewhere classified, multiple sites: Secondary | ICD-10-CM | POA: Diagnosis not present

## 2022-11-29 DIAGNOSIS — R2689 Other abnormalities of gait and mobility: Secondary | ICD-10-CM | POA: Diagnosis not present

## 2022-11-29 DIAGNOSIS — R279 Unspecified lack of coordination: Secondary | ICD-10-CM | POA: Diagnosis not present

## 2022-11-30 DIAGNOSIS — Z79899 Other long term (current) drug therapy: Secondary | ICD-10-CM | POA: Diagnosis not present

## 2022-11-30 DIAGNOSIS — M79605 Pain in left leg: Secondary | ICD-10-CM | POA: Diagnosis not present

## 2022-11-30 DIAGNOSIS — M79604 Pain in right leg: Secondary | ICD-10-CM | POA: Diagnosis not present

## 2022-12-04 DIAGNOSIS — R2689 Other abnormalities of gait and mobility: Secondary | ICD-10-CM | POA: Diagnosis not present

## 2022-12-04 DIAGNOSIS — R279 Unspecified lack of coordination: Secondary | ICD-10-CM | POA: Diagnosis not present

## 2022-12-04 DIAGNOSIS — M6259 Muscle wasting and atrophy, not elsewhere classified, multiple sites: Secondary | ICD-10-CM | POA: Diagnosis not present

## 2022-12-05 DIAGNOSIS — T50905A Adverse effect of unspecified drugs, medicaments and biological substances, initial encounter: Secondary | ICD-10-CM | POA: Diagnosis not present

## 2022-12-05 DIAGNOSIS — M159 Polyosteoarthritis, unspecified: Secondary | ICD-10-CM | POA: Diagnosis not present

## 2022-12-05 DIAGNOSIS — Z6822 Body mass index (BMI) 22.0-22.9, adult: Secondary | ICD-10-CM | POA: Diagnosis not present

## 2022-12-05 DIAGNOSIS — Z9989 Dependence on other enabling machines and devices: Secondary | ICD-10-CM | POA: Diagnosis not present

## 2022-12-07 DIAGNOSIS — M6259 Muscle wasting and atrophy, not elsewhere classified, multiple sites: Secondary | ICD-10-CM | POA: Diagnosis not present

## 2022-12-07 DIAGNOSIS — R279 Unspecified lack of coordination: Secondary | ICD-10-CM | POA: Diagnosis not present

## 2022-12-07 DIAGNOSIS — R2689 Other abnormalities of gait and mobility: Secondary | ICD-10-CM | POA: Diagnosis not present

## 2022-12-13 DIAGNOSIS — Z6822 Body mass index (BMI) 22.0-22.9, adult: Secondary | ICD-10-CM | POA: Diagnosis not present

## 2022-12-13 DIAGNOSIS — R3915 Urgency of urination: Secondary | ICD-10-CM | POA: Diagnosis not present

## 2022-12-13 DIAGNOSIS — R300 Dysuria: Secondary | ICD-10-CM | POA: Diagnosis not present

## 2022-12-13 DIAGNOSIS — R102 Pelvic and perineal pain: Secondary | ICD-10-CM | POA: Diagnosis not present

## 2022-12-14 DIAGNOSIS — R279 Unspecified lack of coordination: Secondary | ICD-10-CM | POA: Diagnosis not present

## 2022-12-14 DIAGNOSIS — M6259 Muscle wasting and atrophy, not elsewhere classified, multiple sites: Secondary | ICD-10-CM | POA: Diagnosis not present

## 2022-12-14 DIAGNOSIS — R2689 Other abnormalities of gait and mobility: Secondary | ICD-10-CM | POA: Diagnosis not present

## 2022-12-17 DIAGNOSIS — R279 Unspecified lack of coordination: Secondary | ICD-10-CM | POA: Diagnosis not present

## 2022-12-17 DIAGNOSIS — R2689 Other abnormalities of gait and mobility: Secondary | ICD-10-CM | POA: Diagnosis not present

## 2022-12-17 DIAGNOSIS — M6259 Muscle wasting and atrophy, not elsewhere classified, multiple sites: Secondary | ICD-10-CM | POA: Diagnosis not present

## 2022-12-21 DIAGNOSIS — R2689 Other abnormalities of gait and mobility: Secondary | ICD-10-CM | POA: Diagnosis not present

## 2022-12-21 DIAGNOSIS — M6259 Muscle wasting and atrophy, not elsewhere classified, multiple sites: Secondary | ICD-10-CM | POA: Diagnosis not present

## 2022-12-21 DIAGNOSIS — R279 Unspecified lack of coordination: Secondary | ICD-10-CM | POA: Diagnosis not present

## 2022-12-25 DIAGNOSIS — R279 Unspecified lack of coordination: Secondary | ICD-10-CM | POA: Diagnosis not present

## 2022-12-25 DIAGNOSIS — M6259 Muscle wasting and atrophy, not elsewhere classified, multiple sites: Secondary | ICD-10-CM | POA: Diagnosis not present

## 2022-12-25 DIAGNOSIS — R2689 Other abnormalities of gait and mobility: Secondary | ICD-10-CM | POA: Diagnosis not present

## 2022-12-26 DIAGNOSIS — R2689 Other abnormalities of gait and mobility: Secondary | ICD-10-CM | POA: Diagnosis not present

## 2022-12-26 DIAGNOSIS — M6259 Muscle wasting and atrophy, not elsewhere classified, multiple sites: Secondary | ICD-10-CM | POA: Diagnosis not present

## 2022-12-26 DIAGNOSIS — R279 Unspecified lack of coordination: Secondary | ICD-10-CM | POA: Diagnosis not present

## 2023-01-02 DIAGNOSIS — F411 Generalized anxiety disorder: Secondary | ICD-10-CM | POA: Diagnosis not present

## 2023-01-02 DIAGNOSIS — Z6821 Body mass index (BMI) 21.0-21.9, adult: Secondary | ICD-10-CM | POA: Diagnosis not present

## 2023-01-02 DIAGNOSIS — G4700 Insomnia, unspecified: Secondary | ICD-10-CM | POA: Diagnosis not present

## 2023-01-22 DIAGNOSIS — Z0000 Encounter for general adult medical examination without abnormal findings: Secondary | ICD-10-CM | POA: Diagnosis not present

## 2023-01-22 DIAGNOSIS — Z1389 Encounter for screening for other disorder: Secondary | ICD-10-CM | POA: Diagnosis not present

## 2023-01-22 DIAGNOSIS — Z6821 Body mass index (BMI) 21.0-21.9, adult: Secondary | ICD-10-CM | POA: Diagnosis not present

## 2023-01-28 DIAGNOSIS — F3342 Major depressive disorder, recurrent, in full remission: Secondary | ICD-10-CM | POA: Diagnosis not present

## 2023-01-28 DIAGNOSIS — M810 Age-related osteoporosis without current pathological fracture: Secondary | ICD-10-CM | POA: Diagnosis not present

## 2023-01-28 DIAGNOSIS — Z79899 Other long term (current) drug therapy: Secondary | ICD-10-CM | POA: Diagnosis not present

## 2023-01-28 DIAGNOSIS — K2270 Barrett's esophagus without dysplasia: Secondary | ICD-10-CM | POA: Diagnosis not present

## 2023-01-28 DIAGNOSIS — Z23 Encounter for immunization: Secondary | ICD-10-CM | POA: Diagnosis not present

## 2023-01-28 DIAGNOSIS — M159 Polyosteoarthritis, unspecified: Secondary | ICD-10-CM | POA: Diagnosis not present

## 2023-01-28 DIAGNOSIS — Z6821 Body mass index (BMI) 21.0-21.9, adult: Secondary | ICD-10-CM | POA: Diagnosis not present

## 2023-01-28 DIAGNOSIS — G4700 Insomnia, unspecified: Secondary | ICD-10-CM | POA: Diagnosis not present

## 2023-01-28 DIAGNOSIS — M2550 Pain in unspecified joint: Secondary | ICD-10-CM | POA: Diagnosis not present

## 2023-01-28 DIAGNOSIS — F411 Generalized anxiety disorder: Secondary | ICD-10-CM | POA: Diagnosis not present

## 2023-03-19 DIAGNOSIS — B353 Tinea pedis: Secondary | ICD-10-CM | POA: Diagnosis not present

## 2023-03-19 DIAGNOSIS — B354 Tinea corporis: Secondary | ICD-10-CM | POA: Diagnosis not present

## 2023-05-07 DIAGNOSIS — F3342 Major depressive disorder, recurrent, in full remission: Secondary | ICD-10-CM | POA: Diagnosis not present

## 2023-05-07 DIAGNOSIS — G4700 Insomnia, unspecified: Secondary | ICD-10-CM | POA: Diagnosis not present

## 2023-05-07 DIAGNOSIS — Z6821 Body mass index (BMI) 21.0-21.9, adult: Secondary | ICD-10-CM | POA: Diagnosis not present

## 2023-05-21 DIAGNOSIS — L300 Nummular dermatitis: Secondary | ICD-10-CM | POA: Diagnosis not present

## 2023-05-30 DIAGNOSIS — R300 Dysuria: Secondary | ICD-10-CM | POA: Diagnosis not present

## 2023-05-30 DIAGNOSIS — Z6822 Body mass index (BMI) 22.0-22.9, adult: Secondary | ICD-10-CM | POA: Diagnosis not present

## 2023-06-03 DIAGNOSIS — G479 Sleep disorder, unspecified: Secondary | ICD-10-CM | POA: Diagnosis not present

## 2023-06-03 DIAGNOSIS — Z6821 Body mass index (BMI) 21.0-21.9, adult: Secondary | ICD-10-CM | POA: Diagnosis not present

## 2023-06-03 DIAGNOSIS — M25562 Pain in left knee: Secondary | ICD-10-CM | POA: Diagnosis not present

## 2023-06-03 DIAGNOSIS — G8929 Other chronic pain: Secondary | ICD-10-CM | POA: Diagnosis not present

## 2023-06-03 DIAGNOSIS — M25561 Pain in right knee: Secondary | ICD-10-CM | POA: Diagnosis not present

## 2023-06-13 DIAGNOSIS — R2681 Unsteadiness on feet: Secondary | ICD-10-CM | POA: Diagnosis not present

## 2023-06-13 DIAGNOSIS — R2689 Other abnormalities of gait and mobility: Secondary | ICD-10-CM | POA: Diagnosis not present

## 2023-06-13 DIAGNOSIS — R278 Other lack of coordination: Secondary | ICD-10-CM | POA: Diagnosis not present

## 2023-06-13 DIAGNOSIS — M6259 Muscle wasting and atrophy, not elsewhere classified, multiple sites: Secondary | ICD-10-CM | POA: Diagnosis not present

## 2023-06-18 DIAGNOSIS — M6259 Muscle wasting and atrophy, not elsewhere classified, multiple sites: Secondary | ICD-10-CM | POA: Diagnosis not present

## 2023-06-18 DIAGNOSIS — R2681 Unsteadiness on feet: Secondary | ICD-10-CM | POA: Diagnosis not present

## 2023-06-18 DIAGNOSIS — R278 Other lack of coordination: Secondary | ICD-10-CM | POA: Diagnosis not present

## 2023-06-18 DIAGNOSIS — D0461 Carcinoma in situ of skin of right upper limb, including shoulder: Secondary | ICD-10-CM | POA: Diagnosis not present

## 2023-06-18 DIAGNOSIS — R2689 Other abnormalities of gait and mobility: Secondary | ICD-10-CM | POA: Diagnosis not present

## 2023-06-18 DIAGNOSIS — D485 Neoplasm of uncertain behavior of skin: Secondary | ICD-10-CM | POA: Diagnosis not present

## 2023-06-18 DIAGNOSIS — L300 Nummular dermatitis: Secondary | ICD-10-CM | POA: Diagnosis not present

## 2023-06-20 DIAGNOSIS — R2681 Unsteadiness on feet: Secondary | ICD-10-CM | POA: Diagnosis not present

## 2023-06-20 DIAGNOSIS — R2689 Other abnormalities of gait and mobility: Secondary | ICD-10-CM | POA: Diagnosis not present

## 2023-06-20 DIAGNOSIS — R278 Other lack of coordination: Secondary | ICD-10-CM | POA: Diagnosis not present

## 2023-06-20 DIAGNOSIS — M6259 Muscle wasting and atrophy, not elsewhere classified, multiple sites: Secondary | ICD-10-CM | POA: Diagnosis not present

## 2023-06-25 DIAGNOSIS — M6259 Muscle wasting and atrophy, not elsewhere classified, multiple sites: Secondary | ICD-10-CM | POA: Diagnosis not present

## 2023-06-25 DIAGNOSIS — R2681 Unsteadiness on feet: Secondary | ICD-10-CM | POA: Diagnosis not present

## 2023-06-25 DIAGNOSIS — R278 Other lack of coordination: Secondary | ICD-10-CM | POA: Diagnosis not present

## 2023-06-25 DIAGNOSIS — R2689 Other abnormalities of gait and mobility: Secondary | ICD-10-CM | POA: Diagnosis not present

## 2023-06-26 DIAGNOSIS — M25562 Pain in left knee: Secondary | ICD-10-CM | POA: Diagnosis not present

## 2023-06-26 DIAGNOSIS — G4700 Insomnia, unspecified: Secondary | ICD-10-CM | POA: Diagnosis not present

## 2023-06-26 DIAGNOSIS — M25561 Pain in right knee: Secondary | ICD-10-CM | POA: Diagnosis not present

## 2023-06-26 DIAGNOSIS — G8929 Other chronic pain: Secondary | ICD-10-CM | POA: Diagnosis not present

## 2023-06-26 DIAGNOSIS — Z6822 Body mass index (BMI) 22.0-22.9, adult: Secondary | ICD-10-CM | POA: Diagnosis not present

## 2023-06-28 DIAGNOSIS — M6259 Muscle wasting and atrophy, not elsewhere classified, multiple sites: Secondary | ICD-10-CM | POA: Diagnosis not present

## 2023-06-28 DIAGNOSIS — R2681 Unsteadiness on feet: Secondary | ICD-10-CM | POA: Diagnosis not present

## 2023-06-28 DIAGNOSIS — R2689 Other abnormalities of gait and mobility: Secondary | ICD-10-CM | POA: Diagnosis not present

## 2023-06-28 DIAGNOSIS — R278 Other lack of coordination: Secondary | ICD-10-CM | POA: Diagnosis not present

## 2023-07-23 DIAGNOSIS — M79605 Pain in left leg: Secondary | ICD-10-CM | POA: Diagnosis not present

## 2023-07-23 DIAGNOSIS — M79604 Pain in right leg: Secondary | ICD-10-CM | POA: Diagnosis not present

## 2023-08-01 DIAGNOSIS — D0461 Carcinoma in situ of skin of right upper limb, including shoulder: Secondary | ICD-10-CM | POA: Diagnosis not present

## 2023-08-01 DIAGNOSIS — L400 Psoriasis vulgaris: Secondary | ICD-10-CM | POA: Diagnosis not present

## 2023-08-05 DIAGNOSIS — M810 Age-related osteoporosis without current pathological fracture: Secondary | ICD-10-CM | POA: Diagnosis not present

## 2023-08-05 DIAGNOSIS — M79605 Pain in left leg: Secondary | ICD-10-CM | POA: Diagnosis not present

## 2023-08-05 DIAGNOSIS — M79604 Pain in right leg: Secondary | ICD-10-CM | POA: Diagnosis not present

## 2023-08-05 DIAGNOSIS — Z6821 Body mass index (BMI) 21.0-21.9, adult: Secondary | ICD-10-CM | POA: Diagnosis not present

## 2023-08-06 DIAGNOSIS — R278 Other lack of coordination: Secondary | ICD-10-CM | POA: Diagnosis not present

## 2023-08-06 DIAGNOSIS — R2689 Other abnormalities of gait and mobility: Secondary | ICD-10-CM | POA: Diagnosis not present

## 2023-08-06 DIAGNOSIS — R2681 Unsteadiness on feet: Secondary | ICD-10-CM | POA: Diagnosis not present

## 2023-08-06 DIAGNOSIS — M6259 Muscle wasting and atrophy, not elsewhere classified, multiple sites: Secondary | ICD-10-CM | POA: Diagnosis not present

## 2023-08-08 DIAGNOSIS — R2689 Other abnormalities of gait and mobility: Secondary | ICD-10-CM | POA: Diagnosis not present

## 2023-08-08 DIAGNOSIS — R2681 Unsteadiness on feet: Secondary | ICD-10-CM | POA: Diagnosis not present

## 2023-08-08 DIAGNOSIS — M6259 Muscle wasting and atrophy, not elsewhere classified, multiple sites: Secondary | ICD-10-CM | POA: Diagnosis not present

## 2023-08-08 DIAGNOSIS — R278 Other lack of coordination: Secondary | ICD-10-CM | POA: Diagnosis not present

## 2023-08-13 DIAGNOSIS — R278 Other lack of coordination: Secondary | ICD-10-CM | POA: Diagnosis not present

## 2023-08-13 DIAGNOSIS — R2689 Other abnormalities of gait and mobility: Secondary | ICD-10-CM | POA: Diagnosis not present

## 2023-08-13 DIAGNOSIS — M6259 Muscle wasting and atrophy, not elsewhere classified, multiple sites: Secondary | ICD-10-CM | POA: Diagnosis not present

## 2023-08-13 DIAGNOSIS — R2681 Unsteadiness on feet: Secondary | ICD-10-CM | POA: Diagnosis not present

## 2023-08-15 DIAGNOSIS — R2681 Unsteadiness on feet: Secondary | ICD-10-CM | POA: Diagnosis not present

## 2023-08-15 DIAGNOSIS — M6259 Muscle wasting and atrophy, not elsewhere classified, multiple sites: Secondary | ICD-10-CM | POA: Diagnosis not present

## 2023-08-15 DIAGNOSIS — R278 Other lack of coordination: Secondary | ICD-10-CM | POA: Diagnosis not present

## 2023-08-15 DIAGNOSIS — R2689 Other abnormalities of gait and mobility: Secondary | ICD-10-CM | POA: Diagnosis not present

## 2023-08-20 DIAGNOSIS — R2681 Unsteadiness on feet: Secondary | ICD-10-CM | POA: Diagnosis not present

## 2023-08-20 DIAGNOSIS — R278 Other lack of coordination: Secondary | ICD-10-CM | POA: Diagnosis not present

## 2023-08-20 DIAGNOSIS — R2689 Other abnormalities of gait and mobility: Secondary | ICD-10-CM | POA: Diagnosis not present

## 2023-08-20 DIAGNOSIS — M6259 Muscle wasting and atrophy, not elsewhere classified, multiple sites: Secondary | ICD-10-CM | POA: Diagnosis not present

## 2023-08-21 DIAGNOSIS — Z79899 Other long term (current) drug therapy: Secondary | ICD-10-CM | POA: Diagnosis not present

## 2023-08-21 DIAGNOSIS — M810 Age-related osteoporosis without current pathological fracture: Secondary | ICD-10-CM | POA: Diagnosis not present

## 2023-08-22 DIAGNOSIS — M6259 Muscle wasting and atrophy, not elsewhere classified, multiple sites: Secondary | ICD-10-CM | POA: Diagnosis not present

## 2023-08-22 DIAGNOSIS — R2689 Other abnormalities of gait and mobility: Secondary | ICD-10-CM | POA: Diagnosis not present

## 2023-08-22 DIAGNOSIS — R278 Other lack of coordination: Secondary | ICD-10-CM | POA: Diagnosis not present

## 2023-08-22 DIAGNOSIS — R2681 Unsteadiness on feet: Secondary | ICD-10-CM | POA: Diagnosis not present

## 2023-08-26 DIAGNOSIS — M170 Bilateral primary osteoarthritis of knee: Secondary | ICD-10-CM | POA: Diagnosis not present

## 2023-08-26 DIAGNOSIS — K2970 Gastritis, unspecified, without bleeding: Secondary | ICD-10-CM | POA: Diagnosis not present

## 2023-08-26 DIAGNOSIS — Z6822 Body mass index (BMI) 22.0-22.9, adult: Secondary | ICD-10-CM | POA: Diagnosis not present

## 2023-08-26 DIAGNOSIS — M816 Localized osteoporosis [Lequesne]: Secondary | ICD-10-CM | POA: Diagnosis not present

## 2023-08-26 DIAGNOSIS — F411 Generalized anxiety disorder: Secondary | ICD-10-CM | POA: Diagnosis not present

## 2023-08-29 DIAGNOSIS — R2681 Unsteadiness on feet: Secondary | ICD-10-CM | POA: Diagnosis not present

## 2023-08-29 DIAGNOSIS — R2689 Other abnormalities of gait and mobility: Secondary | ICD-10-CM | POA: Diagnosis not present

## 2023-08-29 DIAGNOSIS — R278 Other lack of coordination: Secondary | ICD-10-CM | POA: Diagnosis not present

## 2023-08-29 DIAGNOSIS — M6259 Muscle wasting and atrophy, not elsewhere classified, multiple sites: Secondary | ICD-10-CM | POA: Diagnosis not present

## 2023-08-30 DIAGNOSIS — R278 Other lack of coordination: Secondary | ICD-10-CM | POA: Diagnosis not present

## 2023-08-30 DIAGNOSIS — R2681 Unsteadiness on feet: Secondary | ICD-10-CM | POA: Diagnosis not present

## 2023-08-30 DIAGNOSIS — M6259 Muscle wasting and atrophy, not elsewhere classified, multiple sites: Secondary | ICD-10-CM | POA: Diagnosis not present

## 2023-08-30 DIAGNOSIS — R2689 Other abnormalities of gait and mobility: Secondary | ICD-10-CM | POA: Diagnosis not present

## 2023-09-02 DIAGNOSIS — M6259 Muscle wasting and atrophy, not elsewhere classified, multiple sites: Secondary | ICD-10-CM | POA: Diagnosis not present

## 2023-09-02 DIAGNOSIS — R278 Other lack of coordination: Secondary | ICD-10-CM | POA: Diagnosis not present

## 2023-09-02 DIAGNOSIS — R2689 Other abnormalities of gait and mobility: Secondary | ICD-10-CM | POA: Diagnosis not present

## 2023-09-02 DIAGNOSIS — R2681 Unsteadiness on feet: Secondary | ICD-10-CM | POA: Diagnosis not present

## 2023-09-03 DIAGNOSIS — M79604 Pain in right leg: Secondary | ICD-10-CM | POA: Diagnosis not present

## 2023-09-03 DIAGNOSIS — M79605 Pain in left leg: Secondary | ICD-10-CM | POA: Diagnosis not present

## 2023-09-05 DIAGNOSIS — D0461 Carcinoma in situ of skin of right upper limb, including shoulder: Secondary | ICD-10-CM | POA: Diagnosis not present

## 2023-09-05 DIAGNOSIS — L400 Psoriasis vulgaris: Secondary | ICD-10-CM | POA: Diagnosis not present

## 2023-09-06 DIAGNOSIS — M6259 Muscle wasting and atrophy, not elsewhere classified, multiple sites: Secondary | ICD-10-CM | POA: Diagnosis not present

## 2023-09-06 DIAGNOSIS — R278 Other lack of coordination: Secondary | ICD-10-CM | POA: Diagnosis not present

## 2023-09-06 DIAGNOSIS — R2689 Other abnormalities of gait and mobility: Secondary | ICD-10-CM | POA: Diagnosis not present

## 2023-09-06 DIAGNOSIS — R2681 Unsteadiness on feet: Secondary | ICD-10-CM | POA: Diagnosis not present

## 2023-09-10 DIAGNOSIS — M6259 Muscle wasting and atrophy, not elsewhere classified, multiple sites: Secondary | ICD-10-CM | POA: Diagnosis not present

## 2023-09-10 DIAGNOSIS — R2689 Other abnormalities of gait and mobility: Secondary | ICD-10-CM | POA: Diagnosis not present

## 2023-09-10 DIAGNOSIS — R2681 Unsteadiness on feet: Secondary | ICD-10-CM | POA: Diagnosis not present

## 2023-09-10 DIAGNOSIS — R278 Other lack of coordination: Secondary | ICD-10-CM | POA: Diagnosis not present

## 2023-09-13 DIAGNOSIS — R2681 Unsteadiness on feet: Secondary | ICD-10-CM | POA: Diagnosis not present

## 2023-09-13 DIAGNOSIS — R278 Other lack of coordination: Secondary | ICD-10-CM | POA: Diagnosis not present

## 2023-09-13 DIAGNOSIS — M6259 Muscle wasting and atrophy, not elsewhere classified, multiple sites: Secondary | ICD-10-CM | POA: Diagnosis not present

## 2023-09-13 DIAGNOSIS — R2689 Other abnormalities of gait and mobility: Secondary | ICD-10-CM | POA: Diagnosis not present

## 2023-09-17 DIAGNOSIS — R278 Other lack of coordination: Secondary | ICD-10-CM | POA: Diagnosis not present

## 2023-09-17 DIAGNOSIS — R2689 Other abnormalities of gait and mobility: Secondary | ICD-10-CM | POA: Diagnosis not present

## 2023-09-17 DIAGNOSIS — R2681 Unsteadiness on feet: Secondary | ICD-10-CM | POA: Diagnosis not present

## 2023-09-17 DIAGNOSIS — M6259 Muscle wasting and atrophy, not elsewhere classified, multiple sites: Secondary | ICD-10-CM | POA: Diagnosis not present

## 2023-09-24 DIAGNOSIS — M6259 Muscle wasting and atrophy, not elsewhere classified, multiple sites: Secondary | ICD-10-CM | POA: Diagnosis not present

## 2023-09-24 DIAGNOSIS — R278 Other lack of coordination: Secondary | ICD-10-CM | POA: Diagnosis not present

## 2023-09-24 DIAGNOSIS — R2681 Unsteadiness on feet: Secondary | ICD-10-CM | POA: Diagnosis not present

## 2023-09-24 DIAGNOSIS — R2689 Other abnormalities of gait and mobility: Secondary | ICD-10-CM | POA: Diagnosis not present

## 2023-09-27 DIAGNOSIS — M6259 Muscle wasting and atrophy, not elsewhere classified, multiple sites: Secondary | ICD-10-CM | POA: Diagnosis not present

## 2023-09-27 DIAGNOSIS — R278 Other lack of coordination: Secondary | ICD-10-CM | POA: Diagnosis not present

## 2023-09-27 DIAGNOSIS — R2681 Unsteadiness on feet: Secondary | ICD-10-CM | POA: Diagnosis not present

## 2023-09-27 DIAGNOSIS — R2689 Other abnormalities of gait and mobility: Secondary | ICD-10-CM | POA: Diagnosis not present

## 2023-09-30 DIAGNOSIS — R2681 Unsteadiness on feet: Secondary | ICD-10-CM | POA: Diagnosis not present

## 2023-09-30 DIAGNOSIS — R2689 Other abnormalities of gait and mobility: Secondary | ICD-10-CM | POA: Diagnosis not present

## 2023-09-30 DIAGNOSIS — M6259 Muscle wasting and atrophy, not elsewhere classified, multiple sites: Secondary | ICD-10-CM | POA: Diagnosis not present

## 2023-09-30 DIAGNOSIS — R278 Other lack of coordination: Secondary | ICD-10-CM | POA: Diagnosis not present

## 2023-10-03 DIAGNOSIS — R278 Other lack of coordination: Secondary | ICD-10-CM | POA: Diagnosis not present

## 2023-10-03 DIAGNOSIS — R2689 Other abnormalities of gait and mobility: Secondary | ICD-10-CM | POA: Diagnosis not present

## 2023-10-03 DIAGNOSIS — R2681 Unsteadiness on feet: Secondary | ICD-10-CM | POA: Diagnosis not present

## 2023-10-03 DIAGNOSIS — M6259 Muscle wasting and atrophy, not elsewhere classified, multiple sites: Secondary | ICD-10-CM | POA: Diagnosis not present

## 2023-10-07 DIAGNOSIS — R278 Other lack of coordination: Secondary | ICD-10-CM | POA: Diagnosis not present

## 2023-10-07 DIAGNOSIS — R2689 Other abnormalities of gait and mobility: Secondary | ICD-10-CM | POA: Diagnosis not present

## 2023-10-07 DIAGNOSIS — R2681 Unsteadiness on feet: Secondary | ICD-10-CM | POA: Diagnosis not present

## 2023-10-07 DIAGNOSIS — M6259 Muscle wasting and atrophy, not elsewhere classified, multiple sites: Secondary | ICD-10-CM | POA: Diagnosis not present

## 2023-10-09 DIAGNOSIS — R278 Other lack of coordination: Secondary | ICD-10-CM | POA: Diagnosis not present

## 2023-10-09 DIAGNOSIS — R2689 Other abnormalities of gait and mobility: Secondary | ICD-10-CM | POA: Diagnosis not present

## 2023-10-09 DIAGNOSIS — R2681 Unsteadiness on feet: Secondary | ICD-10-CM | POA: Diagnosis not present

## 2023-10-09 DIAGNOSIS — M6259 Muscle wasting and atrophy, not elsewhere classified, multiple sites: Secondary | ICD-10-CM | POA: Diagnosis not present

## 2023-10-15 DIAGNOSIS — M6259 Muscle wasting and atrophy, not elsewhere classified, multiple sites: Secondary | ICD-10-CM | POA: Diagnosis not present

## 2023-10-15 DIAGNOSIS — R2681 Unsteadiness on feet: Secondary | ICD-10-CM | POA: Diagnosis not present

## 2023-10-15 DIAGNOSIS — R2689 Other abnormalities of gait and mobility: Secondary | ICD-10-CM | POA: Diagnosis not present

## 2023-10-15 DIAGNOSIS — R278 Other lack of coordination: Secondary | ICD-10-CM | POA: Diagnosis not present

## 2023-10-16 DIAGNOSIS — F03A11 Unspecified dementia, mild, with agitation: Secondary | ICD-10-CM | POA: Diagnosis not present

## 2023-10-16 DIAGNOSIS — F331 Major depressive disorder, recurrent, moderate: Secondary | ICD-10-CM | POA: Diagnosis not present

## 2023-10-16 DIAGNOSIS — Z6821 Body mass index (BMI) 21.0-21.9, adult: Secondary | ICD-10-CM | POA: Diagnosis not present

## 2023-10-17 DIAGNOSIS — Z86007 Personal history of in-situ neoplasm of skin: Secondary | ICD-10-CM | POA: Diagnosis not present

## 2023-10-17 DIAGNOSIS — L309 Dermatitis, unspecified: Secondary | ICD-10-CM | POA: Diagnosis not present

## 2023-10-17 DIAGNOSIS — R278 Other lack of coordination: Secondary | ICD-10-CM | POA: Diagnosis not present

## 2023-10-17 DIAGNOSIS — R2689 Other abnormalities of gait and mobility: Secondary | ICD-10-CM | POA: Diagnosis not present

## 2023-10-17 DIAGNOSIS — L2089 Other atopic dermatitis: Secondary | ICD-10-CM | POA: Diagnosis not present

## 2023-10-17 DIAGNOSIS — Z08 Encounter for follow-up examination after completed treatment for malignant neoplasm: Secondary | ICD-10-CM | POA: Diagnosis not present

## 2023-10-17 DIAGNOSIS — R2681 Unsteadiness on feet: Secondary | ICD-10-CM | POA: Diagnosis not present

## 2023-10-17 DIAGNOSIS — M6259 Muscle wasting and atrophy, not elsewhere classified, multiple sites: Secondary | ICD-10-CM | POA: Diagnosis not present

## 2023-10-17 DIAGNOSIS — L308 Other specified dermatitis: Secondary | ICD-10-CM | POA: Diagnosis not present

## 2023-10-22 DIAGNOSIS — M6259 Muscle wasting and atrophy, not elsewhere classified, multiple sites: Secondary | ICD-10-CM | POA: Diagnosis not present

## 2023-10-22 DIAGNOSIS — R2681 Unsteadiness on feet: Secondary | ICD-10-CM | POA: Diagnosis not present

## 2023-10-22 DIAGNOSIS — R278 Other lack of coordination: Secondary | ICD-10-CM | POA: Diagnosis not present

## 2023-10-22 DIAGNOSIS — R2689 Other abnormalities of gait and mobility: Secondary | ICD-10-CM | POA: Diagnosis not present

## 2023-10-24 DIAGNOSIS — R2681 Unsteadiness on feet: Secondary | ICD-10-CM | POA: Diagnosis not present

## 2023-10-24 DIAGNOSIS — R278 Other lack of coordination: Secondary | ICD-10-CM | POA: Diagnosis not present

## 2023-10-24 DIAGNOSIS — M6259 Muscle wasting and atrophy, not elsewhere classified, multiple sites: Secondary | ICD-10-CM | POA: Diagnosis not present

## 2023-10-24 DIAGNOSIS — R2689 Other abnormalities of gait and mobility: Secondary | ICD-10-CM | POA: Diagnosis not present

## 2023-10-29 DIAGNOSIS — R2681 Unsteadiness on feet: Secondary | ICD-10-CM | POA: Diagnosis not present

## 2023-10-29 DIAGNOSIS — R2689 Other abnormalities of gait and mobility: Secondary | ICD-10-CM | POA: Diagnosis not present

## 2023-10-29 DIAGNOSIS — R278 Other lack of coordination: Secondary | ICD-10-CM | POA: Diagnosis not present

## 2023-10-29 DIAGNOSIS — M6259 Muscle wasting and atrophy, not elsewhere classified, multiple sites: Secondary | ICD-10-CM | POA: Diagnosis not present

## 2023-10-31 DIAGNOSIS — R278 Other lack of coordination: Secondary | ICD-10-CM | POA: Diagnosis not present

## 2023-10-31 DIAGNOSIS — R2689 Other abnormalities of gait and mobility: Secondary | ICD-10-CM | POA: Diagnosis not present

## 2023-10-31 DIAGNOSIS — M6259 Muscle wasting and atrophy, not elsewhere classified, multiple sites: Secondary | ICD-10-CM | POA: Diagnosis not present

## 2023-10-31 DIAGNOSIS — R2681 Unsteadiness on feet: Secondary | ICD-10-CM | POA: Diagnosis not present

## 2023-11-05 DIAGNOSIS — R2681 Unsteadiness on feet: Secondary | ICD-10-CM | POA: Diagnosis not present

## 2023-11-05 DIAGNOSIS — R2689 Other abnormalities of gait and mobility: Secondary | ICD-10-CM | POA: Diagnosis not present

## 2023-11-05 DIAGNOSIS — R278 Other lack of coordination: Secondary | ICD-10-CM | POA: Diagnosis not present

## 2023-11-05 DIAGNOSIS — M6259 Muscle wasting and atrophy, not elsewhere classified, multiple sites: Secondary | ICD-10-CM | POA: Diagnosis not present

## 2023-11-08 DIAGNOSIS — R2689 Other abnormalities of gait and mobility: Secondary | ICD-10-CM | POA: Diagnosis not present

## 2023-11-08 DIAGNOSIS — M6259 Muscle wasting and atrophy, not elsewhere classified, multiple sites: Secondary | ICD-10-CM | POA: Diagnosis not present

## 2023-11-08 DIAGNOSIS — R278 Other lack of coordination: Secondary | ICD-10-CM | POA: Diagnosis not present

## 2023-11-08 DIAGNOSIS — R2681 Unsteadiness on feet: Secondary | ICD-10-CM | POA: Diagnosis not present

## 2023-11-11 DIAGNOSIS — R278 Other lack of coordination: Secondary | ICD-10-CM | POA: Diagnosis not present

## 2023-11-11 DIAGNOSIS — R2689 Other abnormalities of gait and mobility: Secondary | ICD-10-CM | POA: Diagnosis not present

## 2023-11-11 DIAGNOSIS — R2681 Unsteadiness on feet: Secondary | ICD-10-CM | POA: Diagnosis not present

## 2023-11-11 DIAGNOSIS — M6259 Muscle wasting and atrophy, not elsewhere classified, multiple sites: Secondary | ICD-10-CM | POA: Diagnosis not present

## 2023-11-13 DIAGNOSIS — R278 Other lack of coordination: Secondary | ICD-10-CM | POA: Diagnosis not present

## 2023-11-13 DIAGNOSIS — R2681 Unsteadiness on feet: Secondary | ICD-10-CM | POA: Diagnosis not present

## 2023-11-13 DIAGNOSIS — R2689 Other abnormalities of gait and mobility: Secondary | ICD-10-CM | POA: Diagnosis not present

## 2023-11-13 DIAGNOSIS — M6259 Muscle wasting and atrophy, not elsewhere classified, multiple sites: Secondary | ICD-10-CM | POA: Diagnosis not present

## 2023-11-18 DIAGNOSIS — M6259 Muscle wasting and atrophy, not elsewhere classified, multiple sites: Secondary | ICD-10-CM | POA: Diagnosis not present

## 2023-11-18 DIAGNOSIS — R278 Other lack of coordination: Secondary | ICD-10-CM | POA: Diagnosis not present

## 2023-11-18 DIAGNOSIS — R2689 Other abnormalities of gait and mobility: Secondary | ICD-10-CM | POA: Diagnosis not present

## 2023-11-18 DIAGNOSIS — R2681 Unsteadiness on feet: Secondary | ICD-10-CM | POA: Diagnosis not present

## 2023-11-20 DIAGNOSIS — R2681 Unsteadiness on feet: Secondary | ICD-10-CM | POA: Diagnosis not present

## 2023-11-20 DIAGNOSIS — R278 Other lack of coordination: Secondary | ICD-10-CM | POA: Diagnosis not present

## 2023-11-20 DIAGNOSIS — R2689 Other abnormalities of gait and mobility: Secondary | ICD-10-CM | POA: Diagnosis not present

## 2023-11-20 DIAGNOSIS — M6259 Muscle wasting and atrophy, not elsewhere classified, multiple sites: Secondary | ICD-10-CM | POA: Diagnosis not present

## 2023-11-25 DIAGNOSIS — M79605 Pain in left leg: Secondary | ICD-10-CM | POA: Diagnosis not present

## 2023-11-25 DIAGNOSIS — F331 Major depressive disorder, recurrent, moderate: Secondary | ICD-10-CM | POA: Diagnosis not present

## 2023-11-25 DIAGNOSIS — Z6820 Body mass index (BMI) 20.0-20.9, adult: Secondary | ICD-10-CM | POA: Diagnosis not present

## 2023-11-25 DIAGNOSIS — M79604 Pain in right leg: Secondary | ICD-10-CM | POA: Diagnosis not present

## 2023-11-25 DIAGNOSIS — R278 Other lack of coordination: Secondary | ICD-10-CM | POA: Diagnosis not present

## 2023-11-25 DIAGNOSIS — R2681 Unsteadiness on feet: Secondary | ICD-10-CM | POA: Diagnosis not present

## 2023-11-25 DIAGNOSIS — G4700 Insomnia, unspecified: Secondary | ICD-10-CM | POA: Diagnosis not present

## 2023-11-25 DIAGNOSIS — F03A11 Unspecified dementia, mild, with agitation: Secondary | ICD-10-CM | POA: Diagnosis not present

## 2023-11-25 DIAGNOSIS — M6259 Muscle wasting and atrophy, not elsewhere classified, multiple sites: Secondary | ICD-10-CM | POA: Diagnosis not present

## 2023-11-25 DIAGNOSIS — R2689 Other abnormalities of gait and mobility: Secondary | ICD-10-CM | POA: Diagnosis not present

## 2023-11-28 DIAGNOSIS — R278 Other lack of coordination: Secondary | ICD-10-CM | POA: Diagnosis not present

## 2023-11-28 DIAGNOSIS — M6259 Muscle wasting and atrophy, not elsewhere classified, multiple sites: Secondary | ICD-10-CM | POA: Diagnosis not present

## 2023-11-28 DIAGNOSIS — R2681 Unsteadiness on feet: Secondary | ICD-10-CM | POA: Diagnosis not present

## 2023-11-28 DIAGNOSIS — R2689 Other abnormalities of gait and mobility: Secondary | ICD-10-CM | POA: Diagnosis not present

## 2023-12-03 DIAGNOSIS — M6259 Muscle wasting and atrophy, not elsewhere classified, multiple sites: Secondary | ICD-10-CM | POA: Diagnosis not present

## 2023-12-03 DIAGNOSIS — R278 Other lack of coordination: Secondary | ICD-10-CM | POA: Diagnosis not present

## 2023-12-03 DIAGNOSIS — R2689 Other abnormalities of gait and mobility: Secondary | ICD-10-CM | POA: Diagnosis not present

## 2023-12-03 DIAGNOSIS — R2681 Unsteadiness on feet: Secondary | ICD-10-CM | POA: Diagnosis not present

## 2023-12-05 DIAGNOSIS — R2681 Unsteadiness on feet: Secondary | ICD-10-CM | POA: Diagnosis not present

## 2023-12-05 DIAGNOSIS — R2689 Other abnormalities of gait and mobility: Secondary | ICD-10-CM | POA: Diagnosis not present

## 2023-12-05 DIAGNOSIS — R278 Other lack of coordination: Secondary | ICD-10-CM | POA: Diagnosis not present

## 2023-12-05 DIAGNOSIS — M6259 Muscle wasting and atrophy, not elsewhere classified, multiple sites: Secondary | ICD-10-CM | POA: Diagnosis not present

## 2023-12-09 DIAGNOSIS — R2689 Other abnormalities of gait and mobility: Secondary | ICD-10-CM | POA: Diagnosis not present

## 2023-12-09 DIAGNOSIS — R278 Other lack of coordination: Secondary | ICD-10-CM | POA: Diagnosis not present

## 2023-12-09 DIAGNOSIS — N390 Urinary tract infection, site not specified: Secondary | ICD-10-CM | POA: Diagnosis not present

## 2023-12-09 DIAGNOSIS — M6259 Muscle wasting and atrophy, not elsewhere classified, multiple sites: Secondary | ICD-10-CM | POA: Diagnosis not present

## 2023-12-09 DIAGNOSIS — R2681 Unsteadiness on feet: Secondary | ICD-10-CM | POA: Diagnosis not present

## 2023-12-09 DIAGNOSIS — R140 Abdominal distension (gaseous): Secondary | ICD-10-CM | POA: Diagnosis not present

## 2023-12-13 DIAGNOSIS — R2681 Unsteadiness on feet: Secondary | ICD-10-CM | POA: Diagnosis not present

## 2023-12-13 DIAGNOSIS — M6259 Muscle wasting and atrophy, not elsewhere classified, multiple sites: Secondary | ICD-10-CM | POA: Diagnosis not present

## 2023-12-13 DIAGNOSIS — R2689 Other abnormalities of gait and mobility: Secondary | ICD-10-CM | POA: Diagnosis not present

## 2023-12-13 DIAGNOSIS — R278 Other lack of coordination: Secondary | ICD-10-CM | POA: Diagnosis not present

## 2023-12-16 DIAGNOSIS — R278 Other lack of coordination: Secondary | ICD-10-CM | POA: Diagnosis not present

## 2023-12-16 DIAGNOSIS — R2689 Other abnormalities of gait and mobility: Secondary | ICD-10-CM | POA: Diagnosis not present

## 2023-12-16 DIAGNOSIS — M6259 Muscle wasting and atrophy, not elsewhere classified, multiple sites: Secondary | ICD-10-CM | POA: Diagnosis not present

## 2023-12-16 DIAGNOSIS — R2681 Unsteadiness on feet: Secondary | ICD-10-CM | POA: Diagnosis not present

## 2023-12-20 DIAGNOSIS — M6259 Muscle wasting and atrophy, not elsewhere classified, multiple sites: Secondary | ICD-10-CM | POA: Diagnosis not present

## 2023-12-20 DIAGNOSIS — R2681 Unsteadiness on feet: Secondary | ICD-10-CM | POA: Diagnosis not present

## 2023-12-20 DIAGNOSIS — R278 Other lack of coordination: Secondary | ICD-10-CM | POA: Diagnosis not present

## 2023-12-20 DIAGNOSIS — R2689 Other abnormalities of gait and mobility: Secondary | ICD-10-CM | POA: Diagnosis not present

## 2023-12-24 DIAGNOSIS — M6259 Muscle wasting and atrophy, not elsewhere classified, multiple sites: Secondary | ICD-10-CM | POA: Diagnosis not present

## 2023-12-24 DIAGNOSIS — R278 Other lack of coordination: Secondary | ICD-10-CM | POA: Diagnosis not present

## 2023-12-24 DIAGNOSIS — R2681 Unsteadiness on feet: Secondary | ICD-10-CM | POA: Diagnosis not present

## 2023-12-24 DIAGNOSIS — R2689 Other abnormalities of gait and mobility: Secondary | ICD-10-CM | POA: Diagnosis not present

## 2023-12-25 DIAGNOSIS — M6259 Muscle wasting and atrophy, not elsewhere classified, multiple sites: Secondary | ICD-10-CM | POA: Diagnosis not present

## 2023-12-25 DIAGNOSIS — R278 Other lack of coordination: Secondary | ICD-10-CM | POA: Diagnosis not present

## 2023-12-25 DIAGNOSIS — R2681 Unsteadiness on feet: Secondary | ICD-10-CM | POA: Diagnosis not present

## 2023-12-25 DIAGNOSIS — R2689 Other abnormalities of gait and mobility: Secondary | ICD-10-CM | POA: Diagnosis not present

## 2024-02-25 DIAGNOSIS — Z79899 Other long term (current) drug therapy: Secondary | ICD-10-CM | POA: Diagnosis not present

## 2024-02-25 DIAGNOSIS — M25561 Pain in right knee: Secondary | ICD-10-CM | POA: Diagnosis not present

## 2024-02-25 DIAGNOSIS — Z6820 Body mass index (BMI) 20.0-20.9, adult: Secondary | ICD-10-CM | POA: Diagnosis not present

## 2024-02-25 DIAGNOSIS — M25562 Pain in left knee: Secondary | ICD-10-CM | POA: Diagnosis not present

## 2024-03-18 DIAGNOSIS — M25562 Pain in left knee: Secondary | ICD-10-CM | POA: Diagnosis not present

## 2024-03-18 DIAGNOSIS — M25561 Pain in right knee: Secondary | ICD-10-CM | POA: Diagnosis not present

## 2024-03-18 DIAGNOSIS — M5459 Other low back pain: Secondary | ICD-10-CM | POA: Diagnosis not present

## 2024-06-10 DIAGNOSIS — R2681 Unsteadiness on feet: Secondary | ICD-10-CM | POA: Diagnosis not present

## 2024-06-10 DIAGNOSIS — R2689 Other abnormalities of gait and mobility: Secondary | ICD-10-CM | POA: Diagnosis not present

## 2024-06-10 DIAGNOSIS — R278 Other lack of coordination: Secondary | ICD-10-CM | POA: Diagnosis not present

## 2024-06-10 DIAGNOSIS — M6259 Muscle wasting and atrophy, not elsewhere classified, multiple sites: Secondary | ICD-10-CM | POA: Diagnosis not present

## 2024-06-11 DIAGNOSIS — R2681 Unsteadiness on feet: Secondary | ICD-10-CM | POA: Diagnosis not present

## 2024-06-11 DIAGNOSIS — R278 Other lack of coordination: Secondary | ICD-10-CM | POA: Diagnosis not present

## 2024-06-11 DIAGNOSIS — M6259 Muscle wasting and atrophy, not elsewhere classified, multiple sites: Secondary | ICD-10-CM | POA: Diagnosis not present

## 2024-06-11 DIAGNOSIS — R2689 Other abnormalities of gait and mobility: Secondary | ICD-10-CM | POA: Diagnosis not present

## 2024-06-12 DIAGNOSIS — K5900 Constipation, unspecified: Secondary | ICD-10-CM | POA: Diagnosis not present

## 2024-06-15 DIAGNOSIS — K5900 Constipation, unspecified: Secondary | ICD-10-CM | POA: Diagnosis not present

## 2024-06-15 DIAGNOSIS — K5901 Slow transit constipation: Secondary | ICD-10-CM | POA: Diagnosis not present

## 2024-06-16 DIAGNOSIS — R278 Other lack of coordination: Secondary | ICD-10-CM | POA: Diagnosis not present

## 2024-06-16 DIAGNOSIS — M6259 Muscle wasting and atrophy, not elsewhere classified, multiple sites: Secondary | ICD-10-CM | POA: Diagnosis not present

## 2024-06-16 DIAGNOSIS — R2689 Other abnormalities of gait and mobility: Secondary | ICD-10-CM | POA: Diagnosis not present

## 2024-06-16 DIAGNOSIS — R2681 Unsteadiness on feet: Secondary | ICD-10-CM | POA: Diagnosis not present

## 2024-06-18 DIAGNOSIS — R2689 Other abnormalities of gait and mobility: Secondary | ICD-10-CM | POA: Diagnosis not present

## 2024-06-18 DIAGNOSIS — M6259 Muscle wasting and atrophy, not elsewhere classified, multiple sites: Secondary | ICD-10-CM | POA: Diagnosis not present

## 2024-06-18 DIAGNOSIS — R2681 Unsteadiness on feet: Secondary | ICD-10-CM | POA: Diagnosis not present

## 2024-06-18 DIAGNOSIS — R278 Other lack of coordination: Secondary | ICD-10-CM | POA: Diagnosis not present

## 2024-06-23 DIAGNOSIS — M6259 Muscle wasting and atrophy, not elsewhere classified, multiple sites: Secondary | ICD-10-CM | POA: Diagnosis not present

## 2024-06-23 DIAGNOSIS — R2689 Other abnormalities of gait and mobility: Secondary | ICD-10-CM | POA: Diagnosis not present

## 2024-06-23 DIAGNOSIS — R2681 Unsteadiness on feet: Secondary | ICD-10-CM | POA: Diagnosis not present

## 2024-06-23 DIAGNOSIS — R278 Other lack of coordination: Secondary | ICD-10-CM | POA: Diagnosis not present
